# Patient Record
Sex: Male | Born: 1970 | Race: White | Hispanic: No | Smoking: Current every day smoker
Health system: Southern US, Community
[De-identification: ages and names within clinical notes are randomized; demographics above are authoritative.]

## PROBLEM LIST (undated history)

## (undated) DIAGNOSIS — R918 Other nonspecific abnormal finding of lung field: Secondary | ICD-10-CM

## (undated) DIAGNOSIS — M1990 Unspecified osteoarthritis, unspecified site: Secondary | ICD-10-CM

## (undated) DIAGNOSIS — I10 Essential (primary) hypertension: Secondary | ICD-10-CM

## (undated) DIAGNOSIS — K219 Gastro-esophageal reflux disease without esophagitis: Secondary | ICD-10-CM

## (undated) DIAGNOSIS — Z933 Colostomy status: Secondary | ICD-10-CM

## (undated) DIAGNOSIS — C3490 Malignant neoplasm of unspecified part of unspecified bronchus or lung: Secondary | ICD-10-CM

## (undated) DIAGNOSIS — R112 Nausea with vomiting, unspecified: Secondary | ICD-10-CM

## (undated) DIAGNOSIS — K5780 Diverticulitis of intestine, part unspecified, with perforation and abscess without bleeding: Secondary | ICD-10-CM

## (undated) DIAGNOSIS — Z9889 Other specified postprocedural states: Secondary | ICD-10-CM

## (undated) DIAGNOSIS — J439 Emphysema, unspecified: Secondary | ICD-10-CM

## (undated) DIAGNOSIS — F32A Depression, unspecified: Secondary | ICD-10-CM

## (undated) DIAGNOSIS — R519 Headache, unspecified: Secondary | ICD-10-CM

## (undated) HISTORY — DX: Malignant neoplasm of unspecified part of unspecified bronchus or lung: C34.90

## (undated) HISTORY — DX: Essential (primary) hypertension: I10

## (undated) HISTORY — DX: Gastro-esophageal reflux disease without esophagitis: K21.9

## (undated) HISTORY — DX: Emphysema, unspecified: J43.9

## (undated) HISTORY — PX: KNEE ARTHROSCOPY: SHX127

## (undated) HISTORY — DX: Other nonspecific abnormal finding of lung field: R91.8

## (undated) HISTORY — PX: HAND TENDON SURGERY: SHX663

## (undated) HISTORY — DX: Colostomy status: Z93.3

## (undated) HISTORY — DX: Diverticulitis of intestine, part unspecified, with perforation and abscess without bleeding: K57.80

---

## 2000-05-16 ENCOUNTER — Ambulatory Visit (HOSPITAL_COMMUNITY): Admission: RE | Admit: 2000-05-16 | Discharge: 2000-05-16 | Payer: Self-pay | Admitting: Specialist

## 2000-05-16 ENCOUNTER — Encounter: Payer: Self-pay | Admitting: Specialist

## 2003-12-07 ENCOUNTER — Emergency Department (HOSPITAL_COMMUNITY): Admission: EM | Admit: 2003-12-07 | Discharge: 2003-12-07 | Payer: Self-pay | Admitting: Emergency Medicine

## 2014-02-10 ENCOUNTER — Emergency Department (HOSPITAL_COMMUNITY)
Admission: EM | Admit: 2014-02-10 | Discharge: 2014-02-11 | Discharge status: Against Medical Advice | Payer: Self-pay | Attending: Emergency Medicine | Admitting: Emergency Medicine

## 2014-02-10 ENCOUNTER — Encounter (HOSPITAL_COMMUNITY): Payer: Self-pay | Admitting: Emergency Medicine

## 2014-02-10 DIAGNOSIS — T148XXA Other injury of unspecified body region, initial encounter: Secondary | ICD-10-CM

## 2014-02-10 DIAGNOSIS — Z720 Tobacco use: Secondary | ICD-10-CM | POA: Insufficient documentation

## 2014-02-10 DIAGNOSIS — R238 Other skin changes: Secondary | ICD-10-CM | POA: Insufficient documentation

## 2014-02-10 NOTE — ED Notes (Signed)
Pt c/o left index finger pain since last night. Pt state it started itching and turned into blood blister per pt and he opened it up.

## 2014-02-11 NOTE — ED Provider Notes (Signed)
CSN: [id number]     Arrival date & time 02/10/14  2236 History   First MD Initiated Contact with Patient 02/10/14 2356     Chief Complaint  Patient presents with  . Insect Bite     (Consider location/radiation/quality/duration/timing/severity/associated sxs/prior Treatment) The history is provided by the patient.   Dakota Bryant is a 43 y.o. male presenting with a lesion on his dorsal left index finger.  He describes developing itching at the site last night while in bed which he scratched and over time it developed a sloughing to the skin surface.  When he looked at it, he thought he saw a dark line which he suspected might be a sliver, but was unable to find when he looked at it under better lighting.  Today the site has progressed to a raised "blood blister" which he lanced using a alcohol swabbed razor blade.  It drained clear fluid, but since has drained blood.  He has had increased swelling, redness and pain at the site and is concerned for possible dangerous insect bite, but denies feeling or seeing an insect when the symptoms began.  He has taken no medicines for this complaint prior to arrival. He is utd with his tetanus.     History reviewed. No pertinent past medical history. History reviewed. No pertinent past surgical history. History reviewed. No pertinent family history. History  Substance Use Topics  . Smoking status: Current Every Day Smoker    Types: Cigarettes  . Smokeless tobacco: Not on file  . Alcohol Use: Yes    Review of Systems  Constitutional: Negative for fever and chills.  Respiratory: Negative for shortness of breath and wheezing.   Skin: Positive for wound.  Neurological: Negative for numbness.      Allergies  Review of patient's allergies indicates not on file.  Home Medications   Prior to Admission medications   Not on File   BP 138/94 mmHg  Pulse 87  Temp(Src) 98.5 F (36.9 C) (Oral)  Resp 20  Ht 5\' 6"  (1.676 m)  Wt 130 lb  (58.968 kg)  BMI 20.99 kg/m2  SpO2 99% Physical Exam  Constitutional: He appears well-developed and well-nourished. No distress.  HENT:  Head: Normocephalic.  Neck: Neck supple.  Cardiovascular: Normal rate.   Pulmonary/Chest: Effort normal. He has no wheezes.  Musculoskeletal: Normal range of motion. He exhibits no edema.  Skin: There is erythema.  Blood filled blister left dorsal index finger middle phalanx.  Mild surrounding erythema and edema of mid finger.  No red streaking.  Cannot visualize fb such as splinter.    ED Course  Procedures (including critical care time)  Plan to xray finger to r/o fb.  Labs Review Labs Reviewed - No data to display  Imaging Review No results found.   EKG Interpretation None      MDM   Final diagnoses:  Blood blister    Pt left dept without notifying staff.  He did not obtain the finger xray.    Evalee Jefferson, PA-C 02/11/14 0139  Wynetta Fines, MD 02/11/14 [phone]

## 2014-02-11 NOTE — ED Notes (Signed)
Patient walked out of department.

## 2014-02-11 NOTE — ED Notes (Signed)
Patient left department without notifying staff.

## 2014-09-01 ENCOUNTER — Other Ambulatory Visit: Payer: Self-pay | Admitting: *Deleted

## 2014-09-01 DIAGNOSIS — A630 Anogenital (venereal) warts: Secondary | ICD-10-CM

## 2015-06-26 ENCOUNTER — Ambulatory Visit (INDEPENDENT_AMBULATORY_CARE_PROVIDER_SITE_OTHER): Payer: Self-pay | Admitting: Family Medicine

## 2015-06-26 VITALS — BP 128/81 | HR 92 | Temp 97.2°F | Ht 66.0 in | Wt 149.0 lb

## 2015-06-26 DIAGNOSIS — S91332A Puncture wound without foreign body, left foot, initial encounter: Secondary | ICD-10-CM

## 2015-06-26 DIAGNOSIS — Z23 Encounter for immunization: Secondary | ICD-10-CM

## 2015-06-26 MED ORDER — SULFAMETHOXAZOLE-TRIMETHOPRIM 800-160 MG PO TABS: 1.0000 | ORAL_TABLET | Freq: Two times a day (BID) | ORAL | Status: DC

## 2015-06-26 NOTE — Patient Instructions (Signed)
Great to meet you! Take all of the antibiotics  If you have worsening pain, redness, warmth, or other concerns please call or come back

## 2015-06-26 NOTE — Progress Notes (Signed)
   HPI  Patient presents today after stepping on nail about 18 hours ago.  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  It appears to issue an injured his foot approximately 1/2 inch. He removed it and kept Working.  He denies fever, chills, sweats.  After he removed the nail he kept working, then he went bowling.  He has pain of the foot and has difficulty walking on it today due to the pain  He has pain and swelling perceived on the dorsal part of his foot.  PMH: Smoking status noted ROS: Per HPI  Objective: BP 128/81 mmHg  Pulse 92  Temp(Src) 97.2 F (36.2 C) (Oral)  Ht '5\' 6"'$  (1.676 m)  Wt 149 lb (67.586 kg)  BMI 24.06 kg/m2 Gen: NAD, alert, cooperative with exam HEENT: NCAT CV: RRR, good S1/S2, no murmur Resp: CTABL, no wheezes, non-labored Neuro: Alert and oriented, No gross deficits  Skin:  Smal closed puncture approx 3-5 mm in length on L plantar surface Warmth extending to dorsal foot, tendernes  To palp of the area but no pus expression or obvious induration  I&D Area was cleaned with Betadine 1 month clear with alcohol Using a 27-gauge needle approximately 1 mL of 2% Xylocaine with epinephrine was used to for analgesia After the area was numb I used an 11 blade to make an 8 mm incision that was 2-4 mm deep over the puncture site. The area was palpated vigorously with no expression of pus, only bloody discharge  Assessment and plan:  # Puncture wound I&D considering his warmth and induration that extended all the way to the dorsal foot, very shallow wound made and no pus expressed. Bactrim DS 7 days Discussed usual healing, wound care, and reasons to return for care. Tetanus given   Meds ordered this encounter  Medications  . esomeprazole (NEXIUM) 20 MG capsule    Sig: Take 20 mg by mouth daily at 12 noon.  .  sulfamethoxazole-trimethoprim (BACTRIM DS) 800-160 MG tablet    Sig: Take 1 tablet by mouth 2 (two) times daily.    Dispense:  14 tablet    Refill:  0    Laroy Apple, MD [facility] 06/26/2015, 8:40 AM

## 2015-06-29 ENCOUNTER — Encounter (INDEPENDENT_AMBULATORY_CARE_PROVIDER_SITE_OTHER): Payer: Self-pay

## 2015-06-29 ENCOUNTER — Ambulatory Visit (INDEPENDENT_AMBULATORY_CARE_PROVIDER_SITE_OTHER): Payer: Self-pay

## 2015-06-29 ENCOUNTER — Encounter: Payer: Self-pay | Admitting: Family Medicine

## 2015-06-29 ENCOUNTER — Ambulatory Visit: Payer: Self-pay | Admitting: Family Medicine

## 2015-06-29 VITALS — BP 136/86 | HR 87 | Temp 97.5°F | Ht 66.0 in | Wt 148.0 lb

## 2015-06-29 DIAGNOSIS — M79672 Pain in left foot: Secondary | ICD-10-CM

## 2015-06-29 DIAGNOSIS — S91332D Puncture wound without foreign body, left foot, subsequent encounter: Secondary | ICD-10-CM

## 2015-06-29 NOTE — Progress Notes (Signed)
   Subjective:    Patient ID: Edman Circle, male    DOB: May 02, 1970, 45 y.o.   MRN: [id number]  HPI Patient here today for follow up on left foot wound. He stepped on a nail last Friday. I spoke to the patient last night he said that the pain in his foot had worsened and that he is using crutches and had trouble bearing weight. I felt like he should come in for possible Rocephin injection but once he arrived realize that he is allergic to penicillin we cannot give him this. He has a history of having had old fracture in his foot with no treatment for this. And because of the increased pain felt like an x-ray was necessary.     There are no active problems to display for this patient.  Outpatient Encounter Prescriptions as of 06/29/2015  Medication Sig  . esomeprazole (NEXIUM) 20 MG capsule Take 20 mg by mouth daily at 12 noon.  . sulfamethoxazole-trimethoprim (BACTRIM DS) 800-160 MG tablet Take 1 tablet by mouth 2 (two) times daily.   No facility-administered encounter medications on file as of 06/29/2015.      Review of Systems  Constitutional: Negative.   HENT: Negative.   Eyes: Negative.   Respiratory: Negative.   Cardiovascular: Negative.   Gastrointestinal: Negative.   Endocrine: Negative.   Genitourinary: Negative.   Musculoskeletal: Negative.   Skin: Wound: left foot - nail.  Allergic/Immunologic: Negative.   Neurological: Negative.   Hematological: Negative.   Psychiatric/Behavioral: Negative.        Objective:   Physical Exam  Constitutional: He is oriented to person, place, and time. He appears well-developed and well-nourished.  Musculoskeletal: He exhibits edema and tenderness.  The patient is using crutches. His foot is swollen. It is tender to palpation on the plantar surface. There is some warmth and redness. There is no drainage from the puncture 1.  Neurological: He is alert and oriented to person, place, and time.  Skin: Skin is warm. No rash noted.  There is erythema. No pallor.  Psychiatric: He has a normal mood and affect. His behavior is normal. Judgment and thought content normal.  Nursing note and vitals reviewed.  BP 136/86 mmHg  Pulse 87  Temp(Src) 97.5 F (36.4 C) (Oral)  Ht '5\' 6"'$  (1.676 m)  Wt 148 lb (67.132 kg)  BMI 23.90 kg/m2   [facility] reading (PRIMARY) by  Dr. Louretta Parma foot x-ray secondary to puncture wound by nail  --reading is pending                                     Assessment & Plan:  1. Left foot pain - DG Foot Complete Left; Future  2. Puncture wound of foot, left, subsequent encounter - DG Foot Complete Left; Future -Continue antibiotic -Elevate foot as much as possible -Take Tylenol if needed for pain  Patient Instructions  Keep foot elevated as much as possible Continue to take antibiotic and stay on this until foot is improved   Arrie Senate MD

## 2015-06-29 NOTE — Addendum Note (Signed)
Addended by: Marylin Crosby on: 06/29/2015 04:19 PM   Modules accepted: Orders

## 2015-06-29 NOTE — Patient Instructions (Signed)
Keep foot elevated as much as possible Continue to take antibiotic and stay on this until foot is improved

## 2015-06-30 ENCOUNTER — Telehealth: Payer: Self-pay

## 2015-06-30 NOTE — Telephone Encounter (Signed)
Pt aware of foot x-ray results

## 2015-06-30 NOTE — Telephone Encounter (Signed)
[facility] to x-ray  Foot x-ray negative; no evidence of a foreign body (nail)

## 2015-07-05 ENCOUNTER — Other Ambulatory Visit: Payer: Self-pay

## 2015-07-05 NOTE — Telephone Encounter (Signed)
Last seen 06/29/15  [facility]

## 2015-07-06 MED ORDER — SULFAMETHOXAZOLE-TRIMETHOPRIM 800-160 MG PO TABS: 1.0000 | ORAL_TABLET | Freq: Two times a day (BID) | ORAL | Status: DC

## 2016-01-06 ENCOUNTER — Ambulatory Visit (INDEPENDENT_AMBULATORY_CARE_PROVIDER_SITE_OTHER): Payer: Self-pay | Admitting: Family Medicine

## 2016-01-06 ENCOUNTER — Encounter: Payer: Self-pay | Admitting: Family Medicine

## 2016-01-06 VITALS — BP 134/94 | HR 72 | Temp 97.9°F | Ht 66.0 in | Wt 148.0 lb

## 2016-01-06 DIAGNOSIS — Z91030 Bee allergy status: Secondary | ICD-10-CM

## 2016-01-06 DIAGNOSIS — Z91038 Other insect allergy status: Secondary | ICD-10-CM

## 2016-01-06 MED ORDER — PREDNISONE 10 MG PO TABS: ORAL_TABLET | ORAL | 0 refills | Status: DC

## 2016-01-06 MED ORDER — METHYLPREDNISOLONE ACETATE 80 MG/ML IJ SUSP
60.0000 mg | Freq: Once | INTRAMUSCULAR | Status: AC
  Administered 2016-01-06: 60 mg via INTRAMUSCULAR

## 2016-01-06 MED ORDER — EPINEPHRINE 0.3 MG/0.3ML IJ SOAJ: 0.3000 mg | Freq: Once | INTRAMUSCULAR | 1 refills | Status: AC

## 2016-01-06 NOTE — Patient Instructions (Addendum)
Take Benadryl or diphenhydramine 25 mg 1 or 2 every 4-6 hours as needed for allergic reaction to bee sting. Take Tylenol as needed for pain Take prednisone until completed You were given a prescription for an EpiPen which should be used for any future bee stings especially if you have a severe reaction like shortness of breath or swelling that is not controlled with the Benadryl. Today drink only cold fluids
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
## 2016-01-20 ENCOUNTER — Other Ambulatory Visit: Payer: Self-pay | Admitting: *Deleted

## 2016-01-20 MED ORDER — CEPHALEXIN 500 MG PO CAPS: 500.0000 mg | ORAL_CAPSULE | Freq: Three times a day (TID) | ORAL | 0 refills | Status: DC

## 2016-01-20 NOTE — Telephone Encounter (Signed)
Per [facility] - please call in Keflex 500 TID x 10 days for pt - he feels there is a lump in his throat

## 2017-06-11 ENCOUNTER — Telehealth: Payer: Self-pay | Admitting: Family Medicine

## 2017-06-11 MED ORDER — MECLIZINE HCL 12.5 MG PO TABS: 12.5000 mg | ORAL_TABLET | Freq: Three times a day (TID) | ORAL | 0 refills | Status: DC | PRN

## 2017-06-11 MED ORDER — SALINE SPRAY 0.65 % NA SOLN: 1.0000 | NASAL | 1 refills | Status: DC | PRN

## 2017-06-11 NOTE — Telephone Encounter (Signed)
Pt contacted [facility] and needed something for nasal drainage that was blood tinged and dizziness.  meds were ordered and approved

## 2018-05-27 ENCOUNTER — Encounter (HOSPITAL_COMMUNITY): Payer: Self-pay | Admitting: Emergency Medicine

## 2018-05-27 ENCOUNTER — Emergency Department (HOSPITAL_COMMUNITY): Payer: No Typology Code available for payment source

## 2018-05-27 ENCOUNTER — Other Ambulatory Visit: Payer: Self-pay

## 2018-05-27 ENCOUNTER — Inpatient Hospital Stay (HOSPITAL_COMMUNITY)
Admission: EM | Admit: 2018-05-27 | Discharge: 2018-05-27 | DRG: 815 | Discharge status: Against Medical Advice | Payer: No Typology Code available for payment source | Attending: General Surgery | Admitting: General Surgery

## 2018-05-27 DIAGNOSIS — S36031A Moderate laceration of spleen, initial encounter: Principal | ICD-10-CM | POA: Diagnosis present

## 2018-05-27 DIAGNOSIS — F17210 Nicotine dependence, cigarettes, uncomplicated: Secondary | ICD-10-CM | POA: Diagnosis present

## 2018-05-27 DIAGNOSIS — Y92410 Unspecified street and highway as the place of occurrence of the external cause: Secondary | ICD-10-CM

## 2018-05-27 DIAGNOSIS — Z79899 Other long term (current) drug therapy: Secondary | ICD-10-CM | POA: Diagnosis not present

## 2018-05-27 DIAGNOSIS — S82042A Displaced comminuted fracture of left patella, initial encounter for closed fracture: Secondary | ICD-10-CM | POA: Diagnosis present

## 2018-05-27 DIAGNOSIS — M25562 Pain in left knee: Secondary | ICD-10-CM | POA: Diagnosis not present

## 2018-05-27 DIAGNOSIS — Z880 Allergy status to penicillin: Secondary | ICD-10-CM | POA: Diagnosis not present

## 2018-05-27 LAB — CBC WITH DIFFERENTIAL/PLATELET
Abs Immature Granulocytes: 0.04 10*3/uL (ref 0.00–0.07)
Basophils Absolute: 0.1 10*3/uL (ref 0.0–0.1)
Basophils Relative: 1 %
Eosinophils Absolute: 0.1 10*3/uL (ref 0.0–0.5)
Eosinophils Relative: 0 %
HCT: 51.9 % (ref 39.0–52.0)
Hemoglobin: 17.3 g/dL — ABNORMAL HIGH (ref 13.0–17.0)
Immature Granulocytes: 0 %
Lymphocytes Relative: 22 %
Lymphs Abs: 3.1 10*3/uL (ref 0.7–4.0)
MCH: 32.3 pg (ref 26.0–34.0)
MCHC: 33.3 g/dL (ref 30.0–36.0)
MCV: 96.8 fL (ref 80.0–100.0)
Monocytes Absolute: 1.1 10*3/uL — ABNORMAL HIGH (ref 0.1–1.0)
Monocytes Relative: 8 %
Neutro Abs: 9.6 10*3/uL — ABNORMAL HIGH (ref 1.7–7.7)
Neutrophils Relative %: 69 %
Platelets: 274 10*3/uL (ref 150–400)
RBC: 5.36 MIL/uL (ref 4.22–5.81)
RDW: 12.4 % (ref 11.5–15.5)
WBC: 14 10*3/uL — ABNORMAL HIGH (ref 4.0–10.5)
nRBC: 0 % (ref 0.0–0.2)

## 2018-05-27 LAB — COMPREHENSIVE METABOLIC PANEL
ALT: 18 U/L (ref 0–44)
AST: 22 U/L (ref 15–41)
Albumin: 4.3 g/dL (ref 3.5–5.0)
Alkaline Phosphatase: 48 U/L (ref 38–126)
Anion gap: 8 (ref 5–15)
BUN: <5 mg/dL — ABNORMAL LOW (ref 6–20)
CHLORIDE: 104 mmol/L (ref 98–111)
CO2: 25 mmol/L (ref 22–32)
Calcium: 8.9 mg/dL (ref 8.9–10.3)
Creatinine, Ser: 0.63 mg/dL (ref 0.61–1.24)
GFR calc Af Amer: >60 mL/min (ref >60)
GFR calc non Af Amer: >60 mL/min (ref >60)
Glucose, Bld: 110 mg/dL — ABNORMAL HIGH (ref 70–99)
Potassium: 3.5 mmol/L (ref 3.5–5.1)
Sodium: 137 mmol/L (ref 135–145)
Total Bilirubin: 0.9 mg/dL (ref 0.3–1.2)
Total Protein: 7.1 g/dL (ref 6.5–8.1)

## 2018-05-27 MED ORDER — IOHEXOL 300 MG/ML  SOLN
100.0000 mL | Freq: Once | INTRAMUSCULAR | Status: AC | PRN
  Administered 2018-05-27: 100 mL via INTRAVENOUS

## 2018-05-27 MED ORDER — SODIUM CHLORIDE 0.9 % IV BOLUS
1000.0000 mL | Freq: Once | INTRAVENOUS | Status: AC
  Administered 2018-05-27: 1000 mL via INTRAVENOUS

## 2018-05-27 MED ORDER — ONDANSETRON HCL 4 MG/2ML IJ SOLN
4.0000 mg | Freq: Once | INTRAMUSCULAR | Status: AC
  Administered 2018-05-27: 4 mg via INTRAVENOUS
  Filled 2018-05-27: qty 2

## 2018-05-27 MED ORDER — HYDROMORPHONE HCL 1 MG/ML IJ SOLN
1.0000 mg | Freq: Once | INTRAMUSCULAR | Status: AC
  Administered 2018-05-27: 1 mg via INTRAVENOUS
  Filled 2018-05-27: qty 1

## 2018-05-27 NOTE — ED Notes (Signed)
Pt refusing admission.  Dr Roderic Palau in to speak with pt and family member.

## 2018-05-27 NOTE — Discharge Instructions (Addendum)
Follow-up with Dr. Percell Miller for your knee.  If any problems return to the hospital to be examined
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
## 2018-05-28 ENCOUNTER — Telehealth: Payer: Self-pay | Admitting: *Deleted

## 2018-05-28 DIAGNOSIS — S36039S Unspecified laceration of spleen, sequela: Secondary | ICD-10-CM

## 2018-05-28 DIAGNOSIS — S82002S Unspecified fracture of left patella, sequela: Secondary | ICD-10-CM
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
## 2018-05-28 NOTE — Telephone Encounter (Signed)
Please call patient and follow through for referral to general surgeon and orthopedic surgeon on an urgent basis hopefully to be seen today.

## 2018-05-28 NOTE — Telephone Encounter (Signed)
Pt aware of referrals   Call cell # [phone]

## 2018-05-29 DIAGNOSIS — M25562 Pain in left knee: Secondary | ICD-10-CM | POA: Insufficient documentation

## 2018-05-29 DIAGNOSIS — S82002A Unspecified fracture of left patella, initial encounter for closed fracture: Secondary | ICD-10-CM | POA: Insufficient documentation

## 2018-05-30 DIAGNOSIS — M25522 Pain in left elbow: Secondary | ICD-10-CM | POA: Insufficient documentation

## 2018-07-18 ENCOUNTER — Telehealth: Payer: Self-pay

## 2018-07-18 NOTE — Telephone Encounter (Signed)
FYI, patient cancelled his appointment for [facility].

## 2018-07-19 ENCOUNTER — Encounter: Payer: Self-pay | Admitting: Physical Therapy

## 2018-07-19 ENCOUNTER — Other Ambulatory Visit: Payer: Self-pay

## 2018-07-19 ENCOUNTER — Ambulatory Visit: Payer: Self-pay | Attending: Orthopedic Surgery | Admitting: Physical Therapy

## 2018-07-19 DIAGNOSIS — M25662 Stiffness of left knee, not elsewhere classified: Secondary | ICD-10-CM | POA: Insufficient documentation

## 2018-07-19 DIAGNOSIS — M6281 Muscle weakness (generalized): Secondary | ICD-10-CM | POA: Insufficient documentation

## 2018-07-19 DIAGNOSIS — M25562 Pain in left knee: Secondary | ICD-10-CM | POA: Insufficient documentation
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
## 2018-07-23 ENCOUNTER — Encounter: Payer: Self-pay | Admitting: Physical Therapy

## 2018-07-23 ENCOUNTER — Ambulatory Visit: Payer: Self-pay | Admitting: Physical Therapy

## 2018-07-23 ENCOUNTER — Other Ambulatory Visit: Payer: Self-pay

## 2018-07-23 DIAGNOSIS — M25562 Pain in left knee: Secondary | ICD-10-CM

## 2018-07-23 DIAGNOSIS — M6281 Muscle weakness (generalized): Secondary | ICD-10-CM

## 2018-07-23 DIAGNOSIS — M25662 Stiffness of left knee, not elsewhere classified: Secondary | ICD-10-CM
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
## 2018-08-01 ENCOUNTER — Ambulatory Visit: Payer: Self-pay | Admitting: Physical Therapy

## 2018-08-01 ENCOUNTER — Encounter: Payer: Self-pay | Admitting: Physical Therapy

## 2018-08-01 ENCOUNTER — Other Ambulatory Visit: Payer: Self-pay

## 2018-08-01 DIAGNOSIS — M25562 Pain in left knee: Secondary | ICD-10-CM

## 2018-08-01 DIAGNOSIS — M6281 Muscle weakness (generalized): Secondary | ICD-10-CM

## 2018-08-01 DIAGNOSIS — M25662 Stiffness of left knee, not elsewhere classified: Secondary | ICD-10-CM
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
## 2018-08-06 ENCOUNTER — Ambulatory Visit: Payer: Self-pay | Admitting: Physical Therapy

## 2018-08-06 ENCOUNTER — Other Ambulatory Visit: Payer: Self-pay

## 2018-08-06 DIAGNOSIS — M25562 Pain in left knee: Secondary | ICD-10-CM

## 2018-08-06 DIAGNOSIS — M6281 Muscle weakness (generalized): Secondary | ICD-10-CM

## 2018-08-06 DIAGNOSIS — M25662 Stiffness of left knee, not elsewhere classified: Secondary | ICD-10-CM
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
## 2018-08-13 ENCOUNTER — Ambulatory Visit: Payer: Self-pay | Admitting: Physical Therapy

## 2018-08-13 ENCOUNTER — Other Ambulatory Visit: Payer: Self-pay

## 2018-08-13 DIAGNOSIS — M25562 Pain in left knee: Secondary | ICD-10-CM

## 2018-08-13 DIAGNOSIS — M25662 Stiffness of left knee, not elsewhere classified: Secondary | ICD-10-CM

## 2018-08-13 DIAGNOSIS — M6281 Muscle weakness (generalized): Secondary | ICD-10-CM
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
## 2018-08-19 ENCOUNTER — Encounter: Payer: Self-pay | Admitting: Physical Therapy

## 2018-08-19 ENCOUNTER — Other Ambulatory Visit: Payer: Self-pay

## 2018-08-19 ENCOUNTER — Ambulatory Visit: Payer: Self-pay | Attending: Orthopedic Surgery | Admitting: Physical Therapy

## 2018-08-19 DIAGNOSIS — M25562 Pain in left knee: Secondary | ICD-10-CM

## 2018-08-19 DIAGNOSIS — M6281 Muscle weakness (generalized): Secondary | ICD-10-CM

## 2018-08-19 DIAGNOSIS — M25662 Stiffness of left knee, not elsewhere classified: Secondary | ICD-10-CM
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
## 2018-08-22 ENCOUNTER — Other Ambulatory Visit: Payer: Self-pay

## 2018-08-22 ENCOUNTER — Ambulatory Visit: Payer: Self-pay | Admitting: Physical Therapy

## 2018-08-22 ENCOUNTER — Encounter: Payer: Self-pay | Admitting: Physical Therapy

## 2018-08-22 DIAGNOSIS — M25562 Pain in left knee: Secondary | ICD-10-CM

## 2018-08-22 DIAGNOSIS — M6281 Muscle weakness (generalized): Secondary | ICD-10-CM

## 2018-08-22 DIAGNOSIS — M25662 Stiffness of left knee, not elsewhere classified: Secondary | ICD-10-CM
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
## 2019-03-21 DIAGNOSIS — U071 COVID-19: Secondary | ICD-10-CM

## 2019-03-21 HISTORY — DX: COVID-19: U07.1

## 2020-11-29 ENCOUNTER — Other Ambulatory Visit: Payer: Self-pay

## 2020-12-01 ENCOUNTER — Ambulatory Visit (INDEPENDENT_AMBULATORY_CARE_PROVIDER_SITE_OTHER): Payer: Self-pay | Admitting: Vascular Surgery

## 2020-12-01 ENCOUNTER — Other Ambulatory Visit: Payer: Self-pay

## 2020-12-01 ENCOUNTER — Ambulatory Visit (INDEPENDENT_AMBULATORY_CARE_PROVIDER_SITE_OTHER): Payer: Self-pay

## 2020-12-01 ENCOUNTER — Encounter: Payer: Self-pay | Admitting: Vascular Surgery

## 2020-12-01 VITALS — BP 171/105 | HR 70 | Temp 97.2°F | Ht 66.0 in | Wt 129.4 lb

## 2020-12-01 DIAGNOSIS — I7389 Other specified peripheral vascular diseases: Secondary | ICD-10-CM

## 2020-12-01 NOTE — Progress Notes (Signed)
[facility] [place]  Patient name: Dakota Bryant MRN: [id number] DOB: Jul 24, 1970 Sex: male  REASON FOR CONSULT: Evaluation ischemia right hand  HPI: Dakota Bryant is a 50 y.o. male, who is here today of a for evaluation of ischemia right hand.  He is a very active otherwise healthy gentleman who approximately 6 weeks ago noticed onset of pain in his right hand.  This has been mostly at night.  He reports that this awakens him at night with severe pain.  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  He is right-handed.  He has no prior history of ischemia.  He is a cigarette smoker.  He has had some progression of ischemic changes on the fourth and fifth fingertips.  He was seen in an outlying emergency room and was potentially diagnosed with Raynaud's disease.  On questioning his use of his hand and his work as a Games developer, he does admit to very frequently using the hyperthenar eminence of his right palm as a hammer.  He does have a callus over this area from frequent use.  History reviewed. No pertinent past medical history.  History reviewed. No pertinent family history.  SOCIAL HISTORY: Social History   Socioeconomic History   Marital status: Divorced    Spouse name: Not on file   Number of children: Not on file   Years of education: Not on file   Highest education level: Not on file  Occupational History   Not on file  Tobacco Use   Smoking status: Every Day    Packs/day: 1.00    Types: Cigarettes   Smokeless tobacco: Never  Substance and Sexual Activity   Alcohol use: Yes    Alcohol/week: 2.0 standard drinks    Types: 2 Cans of beer per week    Comment: nighlty    Drug use: Yes    Types: Marijuana    Comment: occ   Sexual activity: Not on file  Other Topics Concern   Not on file  Social History Narrative   Not on file   Social Determinants of Health   Financial Resource Strain: Not on file   Food Insecurity: Not on file  Transportation Needs: Not on file  Physical Activity: Not on file  Stress: Not on file  Social Connections: Not on file  Intimate Partner Violence: Not on file    Allergies  Allergen Reactions   Penicillins Other (See Comments)    Did it involve swelling of the face/tongue/throat, SOB, or low BP? Unknown Did it involve sudden or severe rash/hives, skin peeling, or any reaction on the inside of your mouth or nose? Unknown Did you need to seek medical attention at a hospital or doctor's office? Unknown When did it last happen?       If all above answers are "NO", may proceed with cephalosporin use.     Current Outpatient Medications  Medication Sig Dispense Refill   esomeprazole (NEXIUM) 20 MG capsule Take 20 mg by mouth daily at 12 noon.     ibuprofen (ADVIL,MOTRIN) 200 MG tablet Take 200-400 mg by mouth every 6 (six) hours as needed for mild pain or moderate pain.     No current facility-administered medications for this visit.    REVIEW OF SYSTEMS:  [X]  denotes positive finding, [ ]  denotes negative finding Cardiac  Comments:  Chest pain or chest pressure: x   Shortness of breath upon exertion: x   Short of  breath when lying flat:    Irregular heart rhythm:        Vascular    Pain in calf, thigh, or hip brought on by ambulation:    Pain in feet at night that wakes you up from your sleep:     Blood clot in your veins:    Leg swelling:         Pulmonary    Oxygen at home:    Productive cough:     Wheezing:         Neurologic    Sudden weakness in arms or legs:  x   Sudden numbness in arms or legs:  x   Sudden onset of difficulty speaking or slurred speech:    Temporary loss of vision in one eye:     Problems with dizziness:         Gastrointestinal    Blood in stool:  x   Vomited blood:         Genitourinary    Burning when urinating:     Blood in urine:        Psychiatric    Major depression:         Hematologic     Bleeding problems:    Problems with blood clotting too easily:        Skin    Rashes or ulcers:        Constitutional    Fever or chills:      PHYSICAL EXAM: Vitals:   12/01/20 0851 12/01/20 0854  BP: (!) 163/104 (!) 171/105  Pulse: 71 70  Temp: (!) 97.2 F (36.2 C)   TempSrc: Temporal   SpO2: 98% 97%  Weight: 129 lb 6.4 oz (58.7 kg)   Height: 5\' 6"  (1.676 m)     GENERAL: The patient is a well-nourished male, in no acute distress. The vital signs are documented above. CARDIOVASCULAR: 2+ radial pulse bilaterally.  I do not feel ulnar pulses bilaterally. PULMONARY: There is good air exchange  MUSCULOSKELETAL: There are no major deformities or cyanosis. NEUROLOGIC: No focal weakness or paresthesias are detected. SKIN: There are no ulcers or rashes noted.  Patient does have ischemic changes with cyanosis on the distal portion of his right fourth and fifth finger.  No tissue loss. PSYCHIATRIC: The patient has a normal affect.       DATA:  Noninvasive studies of right arm reveal blunted waveform in the ulnar artery with normal waveform in the radial.  MEDICAL ISSUES: Patient has classic hypothenar hammer syndrome.  I had a long discussion with the patient regarding this.  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  I explained that this should resolve slowly over time and that there is no surgical treatment.  He had began aspirin therapy on his own 3 to 4 days ago and I have encouraged him to continue this.  He does have a history of GI bleed and will take 81 mg enteric aspirin.  I did explain that there is a chance that he will have tissue loss and if so would require referral to hand surgeon for debridement and possible amputation of the tips of his fingers.  I did explain that this would be unlikely.  He is having a very Difficult time with sleep and with pain at night.  I have asked him to  follow-up with his primary care providers at [facility]  for pain management.  I will see him again in 6 weeks for follow-up.  He will notify us sooner should he develop any tissue loss   Rosetta Posner, MD FACS [facility] Tel [phone] Pager [phone]  Note: Portions of this report may have been transcribed using voice recognition software.  Every effort has been made to ensure accuracy; however, inadvertent computerized transcription errors may still be present.

## 2020-12-03 ENCOUNTER — Ambulatory Visit (INDEPENDENT_AMBULATORY_CARE_PROVIDER_SITE_OTHER): Payer: Self-pay | Admitting: Nurse Practitioner

## 2020-12-03 ENCOUNTER — Encounter: Payer: Self-pay | Admitting: Nurse Practitioner

## 2020-12-03 ENCOUNTER — Other Ambulatory Visit: Payer: Self-pay

## 2020-12-03 VITALS — BP 124/85 | HR 74 | Temp 98.1°F | Resp 20 | Ht 66.0 in | Wt 130.0 lb

## 2020-12-03 DIAGNOSIS — I7389 Other specified peripheral vascular diseases: Secondary | ICD-10-CM

## 2020-12-03 MED ORDER — TRAMADOL HCL 50 MG PO TABS: 50.0000 mg | ORAL_TABLET | Freq: Every evening | ORAL | 0 refills | Status: AC | PRN

## 2020-12-03 NOTE — Progress Notes (Signed)
   Subjective:    Patient ID: Dakota Bryant, male    DOB: Jul 06, 1970, 50 y.o.   MRN: [id number]   Chief Complaint: hand and arm pain  HPI Patient was out of town last week and developed hand pain and hand was turning blue. He went to ED and ws dx with raynaud. When he got home his hand continued to hurt and was not getting better. He went to see a vascular surgeon in [place] and was dx with hypothenar hammer syndrome. This is a rare syndrome in which the blood flow to the fingers is reduced. He says during the day the pain is tolerable, but at night pain is pounding. Rates pain 10/10 intermittently at night. Stays numb and tinging the rest of the time.   Vascular surgeon contacted him yesterday and said that he wants him to see a hand vein specialist to make sure diagnosis is correct. They have done referral and he is waiting to hear from them Does smoke marijuana and a friend gave him a pain med 2 days ago- not sure what it was.  Review of Systems     Objective:   Physical Exam Vitals and nursing note reviewed.  Constitutional:      Appearance: Normal appearance.  Cardiovascular:     Rate and Rhythm: Normal rate and regular rhythm.     Pulses: Normal pulses.     Comments: pounding radial pulse on right Pulmonary:     Effort: Pulmonary effort is normal.     Breath sounds: Normal breath sounds.  Skin:    General: Skin is warm and dry.     Comments: Tips pf right 4th and 5th finger are cyanotic and cool to touch.   Neurological:     General: No focal deficit present.     Mental Status: He is alert and oriented to person, place, and time.     BP 124/85   Pulse 74   Temp 98.1 F (36.7 C) (Temporal)   Resp 20   Ht 5\' 6"  (1.676 m)   Wt 130 lb (59 kg)   SpO2 97%   BMI 20.98 kg/m       Assessment & Plan:  Dakota Bryant in today with chief complaint of Right hand painful (Fingers blue and numb/)   1. Hypothenar hammer syndrome ([place]) Keeps hands warm Pain  contract signed - [employer] (MW), Urine    The above assessment and management plan was discussed with the patient. The patient verbalized understanding of and has agreed to the management plan. Patient is aware to call the clinic if symptoms persist or worsen. Patient is aware when to return to the clinic for a follow-up visit. Patient educated on when it is appropriate to go to the emergency department.   Mary-Margaret Hassell Done, FNP

## 2020-12-06 ENCOUNTER — Telehealth: Payer: Self-pay

## 2020-12-06 NOTE — Telephone Encounter (Signed)
Patient calls today requesting to talk to Dr. Donnetta Hutching concerning severe pain in his hand that keeps him up at night and only allows him to sleep about 15 minutes at a time. He was seen as an urgent referral in [place] on 12/01/20. He denies any wounds. States, "You don't understand, I'm about to cut off these fingers." I advised the patient that Dr. Donnetta Hutching would not be able to help further as surgically we could not intervene and his pain management should come from PCP. He says they have prescribed him tramadol but it is not working. I encouraged him to tell his PCP that the medicine is ineffective. He would still like to talk to Dr. Donnetta Hutching. I told him I would talk to Dr. Donnetta Hutching and get back with him. Discussed with Dr. Donnetta Hutching - MD has personally called to make an urgent referral to Roberts Gaudy at [facility] who specializes in microvascular surgery. He impressed upon them it needed to be as soon as possible.When I hung up with Dr. Donnetta Hutching - patient's retired PCP Dr. Laurance Flatten called to inform [facility] about patient's continued pain. I explained what Dr. Donnetta Hutching had told me and gave him the number to [facility] so he could also ask about a referral. In addition, I called and left a message on the triage nurse VM at [facility] office to ask about patient's referral. I called patient back and informed him of all this. I gave him the number to their office as well. Advised him this was the next step and instructed to call back if he needed further assistance. He verbalized understanding.

## 2020-12-08 ENCOUNTER — Telehealth: Payer: Self-pay | Admitting: Vascular Surgery

## 2020-12-08 DIAGNOSIS — I7389 Other specified peripheral vascular diseases: Secondary | ICD-10-CM | POA: Insufficient documentation

## 2020-12-08 LAB — TOXASSURE SELECT 13 (MW), URINE

## 2020-12-08 NOTE — Telephone Encounter (Signed)
This is a late entry.  I spoke with a hand surgeon in [place] on 12/03/2020 regarding Dakota Bryant case.  He said that there was a Teaching laboratory technician at [facility] who had an interest in this type of palmar arch ischemia.  I called the [facility] orthopedic referral line and spoke with the referral coordinator for Dakota Bryant.  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  I gave my cell phone contact information to Dr. Nicoletta Dress via the office coordinator and ask for him to call me if there is any questions.  I then spoke with the patient directly told him what I had initiated.  He will continue evaluation at [facility]

## 2020-12-13 DIAGNOSIS — M79601 Pain in right arm: Secondary | ICD-10-CM | POA: Insufficient documentation

## 2020-12-13 DIAGNOSIS — I70228 Atherosclerosis of native arteries of extremities with rest pain, other extremity: Secondary | ICD-10-CM | POA: Insufficient documentation

## 2021-01-12 ENCOUNTER — Ambulatory Visit: Payer: Self-pay | Admitting: Vascular Surgery

## 2021-06-01 ENCOUNTER — Encounter (HOSPITAL_COMMUNITY): Payer: Self-pay | Admitting: Emergency Medicine

## 2021-06-01 ENCOUNTER — Other Ambulatory Visit: Payer: Self-pay

## 2021-06-01 ENCOUNTER — Inpatient Hospital Stay (HOSPITAL_COMMUNITY)
Admission: EM | Admit: 2021-06-01 | Discharge: 2021-06-09 | DRG: 854 | Disposition: A | Discharge status: Home / Self Care | Payer: Self-pay | Attending: Family Medicine | Admitting: Family Medicine

## 2021-06-01 DIAGNOSIS — K631 Perforation of intestine (nontraumatic): Secondary | ICD-10-CM

## 2021-06-01 DIAGNOSIS — C3432 Malignant neoplasm of lower lobe, left bronchus or lung: Secondary | ICD-10-CM | POA: Diagnosis present

## 2021-06-01 DIAGNOSIS — F17210 Nicotine dependence, cigarettes, uncomplicated: Secondary | ICD-10-CM | POA: Diagnosis present

## 2021-06-01 DIAGNOSIS — Z880 Allergy status to penicillin: Secondary | ICD-10-CM

## 2021-06-01 DIAGNOSIS — K5792 Diverticulitis of intestine, part unspecified, without perforation or abscess without bleeding: Secondary | ICD-10-CM

## 2021-06-01 DIAGNOSIS — Z716 Tobacco abuse counseling: Secondary | ICD-10-CM

## 2021-06-01 DIAGNOSIS — R918 Other nonspecific abnormal finding of lung field: Secondary | ICD-10-CM | POA: Diagnosis present

## 2021-06-01 DIAGNOSIS — K219 Gastro-esophageal reflux disease without esophagitis: Secondary | ICD-10-CM

## 2021-06-01 DIAGNOSIS — Z7141 Alcohol abuse counseling and surveillance of alcoholic: Secondary | ICD-10-CM

## 2021-06-01 DIAGNOSIS — K5732 Diverticulitis of large intestine without perforation or abscess without bleeding: Secondary | ICD-10-CM

## 2021-06-01 DIAGNOSIS — Z20822 Contact with and (suspected) exposure to covid-19: Secondary | ICD-10-CM | POA: Diagnosis present

## 2021-06-01 DIAGNOSIS — R188 Other ascites: Secondary | ICD-10-CM | POA: Diagnosis present

## 2021-06-01 DIAGNOSIS — F10139 Alcohol abuse with withdrawal, unspecified: Secondary | ICD-10-CM | POA: Diagnosis present

## 2021-06-01 DIAGNOSIS — Z79899 Other long term (current) drug therapy: Secondary | ICD-10-CM

## 2021-06-01 DIAGNOSIS — A419 Sepsis, unspecified organism: Principal | ICD-10-CM

## 2021-06-01 DIAGNOSIS — K5720 Diverticulitis of large intestine with perforation and abscess without bleeding: Principal | ICD-10-CM | POA: Diagnosis present

## 2021-06-01 DIAGNOSIS — I10 Essential (primary) hypertension: Secondary | ICD-10-CM | POA: Diagnosis present

## 2021-06-01 DIAGNOSIS — R911 Solitary pulmonary nodule: Secondary | ICD-10-CM | POA: Diagnosis present

## 2021-06-01 DIAGNOSIS — I160 Hypertensive urgency: Secondary | ICD-10-CM

## 2021-06-01 LAB — CBC
HCT: 55.2 % — ABNORMAL HIGH (ref 39.0–52.0)
Hemoglobin: 18.9 g/dL — ABNORMAL HIGH (ref 13.0–17.0)
MCH: 32.8 pg (ref 26.0–34.0)
MCHC: 34.2 g/dL (ref 30.0–36.0)
MCV: 95.7 fL (ref 80.0–100.0)
Platelets: 270 10*3/uL (ref 150–400)
RBC: 5.77 MIL/uL (ref 4.22–5.81)
RDW: 12.5 % (ref 11.5–15.5)
WBC: 28.6 10*3/uL — ABNORMAL HIGH (ref 4.0–10.5)
nRBC: 0 % (ref 0.0–0.2)

## 2021-06-01 LAB — COMPREHENSIVE METABOLIC PANEL
ALT: 13 U/L (ref 0–44)
AST: 14 U/L — ABNORMAL LOW (ref 15–41)
Albumin: 4.3 g/dL (ref 3.5–5.0)
Alkaline Phosphatase: 57 U/L (ref 38–126)
Anion gap: 14 (ref 5–15)
BUN: 11 mg/dL (ref 6–20)
CO2: 21 mmol/L — ABNORMAL LOW (ref 22–32)
Calcium: 9.3 mg/dL (ref 8.9–10.3)
Chloride: 99 mmol/L (ref 98–111)
Creatinine, Ser: 0.78 mg/dL (ref 0.61–1.24)
GFR, Estimated: >60 mL/min (ref >60)
Glucose, Bld: 119 mg/dL — ABNORMAL HIGH (ref 70–99)
Potassium: 3.5 mmol/L (ref 3.5–5.1)
Sodium: 134 mmol/L — ABNORMAL LOW (ref 135–145)
Total Bilirubin: 1.8 mg/dL — ABNORMAL HIGH (ref 0.3–1.2)
Total Protein: 7.6 g/dL (ref 6.5–8.1)

## 2021-06-01 LAB — LIPASE, BLOOD: Lipase: 24 U/L (ref 11–51)

## 2021-06-01 MED ORDER — ONDANSETRON HCL 4 MG/2ML IJ SOLN
4.0000 mg | Freq: Once | INTRAMUSCULAR | Status: AC
  Administered 2021-06-02: 4 mg via INTRAVENOUS
  Filled 2021-06-01: qty 2

## 2021-06-01 MED ORDER — HYDROMORPHONE HCL 1 MG/ML IJ SOLN
1.0000 mg | Freq: Once | INTRAMUSCULAR | Status: AC
  Administered 2021-06-02: 1 mg via INTRAVENOUS
  Filled 2021-06-01: qty 1

## 2021-06-01 MED ORDER — SODIUM CHLORIDE 0.9 % IV BOLUS
1000.0000 mL | Freq: Once | INTRAVENOUS | Status: AC
  Administered 2021-06-02: 1000 mL via INTRAVENOUS

## 2021-06-01 NOTE — ED Provider Notes (Signed)
?[facility] ?Provider Note ? ? ?CSN: [id number] ?Arrival date & time: 06/01/21  2037 ? ?  ? ?History ? ?Chief Complaint  ?Patient presents with  ? Abdominal Pain  ? ? ?Dakota Bryant is a 51 y.o. male. ? ?Patient is a 51 year old male with past medical history of acid reflux.  Patient presenting today with complaints of abdominal pain.  This started 2 days ago.  Pain is all throughout his abdomen and constant.  He reports loose stools earlier today.  He has felt chilled, but denies fever. ? ?The history is provided by the patient.  ?Abdominal Pain ?Pain location:  Generalized ?Pain quality: cramping   ?Pain radiates to:  Does not radiate ?Pain severity:  Severe ?Onset quality:  Sudden ?Duration:  3 days ?Timing:  Constant ?Progression:  Worsening ?Chronicity:  New ?Relieved by:  Nothing ?Worsened by:  Nothing ?Ineffective treatments:  None tried ? ?  ? ?Home Medications ?Prior to Admission medications   ?Medication Sig Start Date End Date Taking? Authorizing Provider  ?esomeprazole (NEXIUM) 20 MG capsule Take 20 mg by mouth daily at 12 noon.    [provider]  ?ibuprofen (ADVIL,MOTRIN) 200 MG tablet Take 200-400 mg by mouth every 6 (six) hours as needed for mild pain or moderate pain.    [provider]  ?   ? ?Allergies    ?Penicillins   ? ?Review of Systems   ?Review of Systems  ?Gastrointestinal:  Positive for abdominal pain.  ?All other systems reviewed and are negative. ? ?Physical Exam ?Updated Vital Signs ?BP (!) 142/102 (BP Location: Right Arm)   Pulse (!) 138   Temp 97.9 ?F (36.6 ?C) (Oral)   Resp 20   Ht 5\' 6"  (1.676 m)   Wt 59 kg   SpO2 96%   BMI 20.98 kg/m?  ?Physical Exam ?Vitals and nursing note reviewed.  ?Constitutional:   ?   General: He is not in acute distress. ?   Appearance: He is well-developed. He is not diaphoretic.  ?HENT:  ?   Head: Normocephalic and atraumatic.  ?Cardiovascular:  ?   Rate and Rhythm: Normal rate and regular rhythm.  ?   Heart  sounds: No murmur heard. ?  No friction rub.  ?Pulmonary:  ?   Effort: Pulmonary effort is normal. No respiratory distress.  ?   Breath sounds: Normal breath sounds. No wheezing or rales.  ?Abdominal:  ?   General: Bowel sounds are normal. There is no distension.  ?   Palpations: Abdomen is soft.  ?   Tenderness: There is generalized abdominal tenderness. There is no right CVA tenderness, left CVA tenderness, guarding or rebound.  ?Musculoskeletal:     ?   General: Normal range of motion.  ?   Cervical back: Normal range of motion and neck supple.  ?Skin: ?   General: Skin is warm and dry.  ?Neurological:  ?   Mental Status: He is alert and oriented to person, place, and time.  ?   Coordination: Coordination normal.  ? ? ?ED Results / Procedures / Treatments   ?Labs ?(all labs ordered are listed, but only abnormal results are displayed) ?Labs Reviewed  ?COMPREHENSIVE METABOLIC PANEL - Abnormal; Notable for the following components:  ?    Result Value  ? Sodium 134 (*)   ? CO2 21 (*)   ? Glucose, Bld 119 (*)   ? AST 14 (*)   ? Total Bilirubin 1.8 (*)   ? All  other components within normal limits  ?CBC - Abnormal; Notable for the following components:  ? WBC 28.6 (*)   ? Hemoglobin 18.9 (*)   ? HCT 55.2 (*)   ? All other components within normal limits  ?LIPASE, BLOOD  ?URINALYSIS, ROUTINE W REFLEX MICROSCOPIC  ? ? ?EKG ?None ? ?Radiology ?No results found. ? ?Procedures ?Procedures  ? ? ?Medications Ordered in ED ?Medications  ?sodium chloride 0.9 % bolus 1,000 mL (has no administration in time range)  ?ondansetron (ZOFRAN) injection 4 mg (has no administration in time range)  ?HYDROmorphone (DILAUDID) injection 1 mg (has no administration in time range)  ? ? ?ED Course/ Medical Decision Making/ A&P ? ?This patient presents to the ED for concern of abdominal pain, this involves an extensive number of treatment options, and is a complaint that carries with it a high risk of complications and morbidity.  The  differential diagnosis includes diverticulitis, cholecystitis, appendicitis, peritonitis, perforated viscus ? ? ?Co morbidities that complicate the patient evaluation ? ?None ? ? ?Additional history obtained: ? ?No additional history or outside records needed ? ? ?Lab Tests: ? ?I Ordered, and personally interpreted labs.  The pertinent results include: White count of 29,000, but laboratory studies otherwise unremarkable ? ? ?Imaging Studies ordered: ? ?I ordered imaging studies including CT scan of the abdomen and pelvis ?I independently visualized and interpreted imaging which showed perforated diverticulitis ?I agree with the radiologist interpretation ? ? ?Cardiac Monitoring: ? ?None ? ? ?Medicines ordered and prescription drug management: ? ?I ordered medication including Dilaudid for pain and Zofran for nausea ?Reevaluation of the patient after these medicines showed that the patient improved ?I have reviewed the patients home medicines and have made adjustments as needed ? ? ?Test Considered: ? ?No other test considered ? ? ?Critical Interventions: ? ?IV fluids and pain medication ? ? ?Consultations Obtained: ? ?I requested consultation with Dr. Constance Haw from general surgery.  She is requesting patient be admitted to the hospitalist service for IV antibiotics pain control.  She will see the patient in the morning. ?I have spoken with Dr. Sidney Ace from the hospitalist service who agrees to admit. ? ? ?Problem List / ED Course: ? ?Patient presenting here with a 3-day history of worsening abdominal pain.  Became severe this evening.  Laboratory studies revealed white count of 29,000 and CT scan shows what appears to be perforated diverticulitis.  This finding was discussed with general surgery and patient will be admitted to the hospitalist service.  IV Cipro and Flagyl administered as well as pain and nausea medicine. ? ? ? ?Social Determinants of Health: ? ?None ? ?CRITICAL CARE ?Performed by: Veryl Speak ?Total  critical care time: 35 minutes ?Critical care time was exclusive of separately billable procedures and treating other patients. ?Critical care was necessary to treat or prevent imminent or life-threatening deterioration. ?Critical care was time spent personally by me on the following activities: development of treatment plan with patient and/or surrogate as well as nursing, discussions with consultants, evaluation of patient's response to treatment, examination of patient, obtaining history from patient or surrogate, ordering and performing treatments and interventions, ordering and review of laboratory studies, ordering and review of radiographic studies, pulse oximetry and re-evaluation of patient's condition. ? ? ? ? ? ?Final Clinical Impression(s) / ED Diagnoses ?Final diagnoses:  ?None  ? ? ?Rx / DC Orders ?ED Discharge Orders   ? ? None  ? ?  ? ? ?  ?Veryl Speak, MD ?06/02/21 6283 ? ?

## 2021-06-01 NOTE — ED Triage Notes (Signed)
Pt states he took meds this am for constipation and since then has had watery/dark stools. Pt states he is not urinating normally. Pt c/o chills and lower back pain as well as abd pain.  ?

## 2021-06-02 ENCOUNTER — Emergency Department (HOSPITAL_COMMUNITY): Payer: Self-pay

## 2021-06-02 DIAGNOSIS — K5792 Diverticulitis of intestine, part unspecified, without perforation or abscess without bleeding: Secondary | ICD-10-CM

## 2021-06-02 DIAGNOSIS — K631 Perforation of intestine (nontraumatic): Secondary | ICD-10-CM

## 2021-06-02 DIAGNOSIS — K219 Gastro-esophageal reflux disease without esophagitis: Secondary | ICD-10-CM

## 2021-06-02 DIAGNOSIS — A419 Sepsis, unspecified organism: Secondary | ICD-10-CM

## 2021-06-02 DIAGNOSIS — I160 Hypertensive urgency: Secondary | ICD-10-CM

## 2021-06-02 LAB — HIV ANTIBODY (ROUTINE TESTING W REFLEX): HIV Screen 4th Generation wRfx: NONREACTIVE

## 2021-06-02 LAB — LACTIC ACID, PLASMA
Lactic Acid, Venous: 0.9 mmol/L (ref 0.5–1.9)
Lactic Acid, Venous: 0.9 mmol/L (ref 0.5–1.9)

## 2021-06-02 LAB — CBC
HCT: 51.5 % (ref 39.0–52.0)
Hemoglobin: 17.6 g/dL — ABNORMAL HIGH (ref 13.0–17.0)
MCH: 32.9 pg (ref 26.0–34.0)
MCHC: 34.2 g/dL (ref 30.0–36.0)
MCV: 96.3 fL (ref 80.0–100.0)
Platelets: 249 10*3/uL (ref 150–400)
RBC: 5.35 MIL/uL (ref 4.22–5.81)
RDW: 12.7 % (ref 11.5–15.5)
WBC: 25.1 10*3/uL — ABNORMAL HIGH (ref 4.0–10.5)
nRBC: 0 % (ref 0.0–0.2)

## 2021-06-02 LAB — BASIC METABOLIC PANEL
Anion gap: 10 (ref 5–15)
BUN: 10 mg/dL (ref 6–20)
CO2: 24 mmol/L (ref 22–32)
Calcium: 8.6 mg/dL — ABNORMAL LOW (ref 8.9–10.3)
Chloride: 101 mmol/L (ref 98–111)
Creatinine, Ser: 0.82 mg/dL (ref 0.61–1.24)
GFR, Estimated: >60 mL/min (ref >60)
Glucose, Bld: 104 mg/dL — ABNORMAL HIGH (ref 70–99)
Potassium: 4 mmol/L (ref 3.5–5.1)
Sodium: 135 mmol/L (ref 135–145)

## 2021-06-02 LAB — HEMOGLOBIN AND HEMATOCRIT, BLOOD
HCT: 50.8 % (ref 39.0–52.0)
Hemoglobin: 17.4 g/dL — ABNORMAL HIGH (ref 13.0–17.0)

## 2021-06-02 LAB — RESP PANEL BY RT-PCR (FLU A&B, COVID) ARPGX2
Influenza A by PCR: NEGATIVE
Influenza B by PCR: NEGATIVE
SARS Coronavirus 2 by RT PCR: NEGATIVE

## 2021-06-02 LAB — MRSA NEXT GEN BY PCR, NASAL: MRSA by PCR Next Gen: NOT DETECTED

## 2021-06-02 LAB — PROTIME-INR
INR: 1.2 (ref 0.8–1.2)
Prothrombin Time: 15.4 seconds — ABNORMAL HIGH (ref 11.4–15.2)

## 2021-06-02 LAB — CORTISOL-AM, BLOOD: Cortisol - AM: 15.8 ug/dL (ref 6.7–22.6)

## 2021-06-02 LAB — PROCALCITONIN: Procalcitonin: 4.62 ng/mL

## 2021-06-02 MED ORDER — SALINE SPRAY 0.65 % NA SOLN
1.0000 | NASAL | Status: DC | PRN
  Filled 2021-06-02: qty 44

## 2021-06-02 MED ORDER — POTASSIUM CHLORIDE IN NACL 20-0.9 MEQ/L-% IV SOLN
INTRAVENOUS | Status: DC
  Filled 2021-06-02: qty 1000

## 2021-06-02 MED ORDER — ONDANSETRON HCL 4 MG PO TABS: 4.0000 mg | ORAL_TABLET | Freq: Four times a day (QID) | ORAL | Status: DC | PRN

## 2021-06-02 MED ORDER — METRONIDAZOLE 500 MG/100ML IV SOLN
500.0000 mg | Freq: Once | INTRAVENOUS | Status: AC
  Administered 2021-06-02: 500 mg via INTRAVENOUS
  Filled 2021-06-02: qty 100

## 2021-06-02 MED ORDER — HYDROMORPHONE HCL 1 MG/ML IJ SOLN
1.0000 mg | Freq: Once | INTRAMUSCULAR | Status: AC
  Administered 2021-06-02: 1 mg via INTRAVENOUS
  Filled 2021-06-02: qty 1

## 2021-06-02 MED ORDER — ACETAMINOPHEN 650 MG RE SUPP: 650.0000 mg | Freq: Four times a day (QID) | RECTAL | Status: DC | PRN

## 2021-06-02 MED ORDER — TRAZODONE HCL 50 MG PO TABS
25.0000 mg | ORAL_TABLET | Freq: Every evening | ORAL | Status: DC | PRN
  Administered 2021-06-04 – 2021-06-07 (×3): 25 mg via ORAL
  Filled 2021-06-02 (×5): qty 1

## 2021-06-02 MED ORDER — PANTOPRAZOLE SODIUM 40 MG IV SOLR
40.0000 mg | Freq: Every day | INTRAVENOUS | Status: DC
  Administered 2021-06-02 – 2021-06-08 (×7): 40 mg via INTRAVENOUS
  Filled 2021-06-02 (×7): qty 10

## 2021-06-02 MED ORDER — SODIUM CHLORIDE 0.9 % IV SOLN
2.0000 g | Freq: Three times a day (TID) | INTRAVENOUS | Status: DC
  Administered 2021-06-02: 2 g via INTRAVENOUS
  Filled 2021-06-02 (×5): qty 2

## 2021-06-02 MED ORDER — ACETAMINOPHEN 325 MG PO TABS: 650.0000 mg | ORAL_TABLET | Freq: Four times a day (QID) | ORAL | Status: DC | PRN

## 2021-06-02 MED ORDER — DIPHENHYDRAMINE HCL 50 MG/ML IJ SOLN
12.5000 mg | Freq: Four times a day (QID) | INTRAMUSCULAR | Status: DC | PRN
  Administered 2021-06-07: 12.5 mg via INTRAVENOUS
  Filled 2021-06-02: qty 1

## 2021-06-02 MED ORDER — LACTATED RINGERS IV BOLUS
1000.0000 mL | Freq: Once | INTRAVENOUS | Status: AC
  Administered 2021-06-02: 1000 mL via INTRAVENOUS

## 2021-06-02 MED ORDER — CHLORHEXIDINE GLUCONATE CLOTH 2 % EX PADS
6.0000 | MEDICATED_PAD | Freq: Every day | CUTANEOUS | Status: DC
  Administered 2021-06-02 – 2021-06-09 (×8): 6 via TOPICAL

## 2021-06-02 MED ORDER — ONDANSETRON HCL 4 MG/2ML IJ SOLN
4.0000 mg | Freq: Four times a day (QID) | INTRAMUSCULAR | Status: DC | PRN
  Administered 2021-06-06: 4 mg via INTRAVENOUS
  Filled 2021-06-02: qty 2

## 2021-06-02 MED ORDER — CIPROFLOXACIN IN D5W 400 MG/200ML IV SOLN
400.0000 mg | Freq: Two times a day (BID) | INTRAVENOUS | Status: DC
Start: 2021-06-02 — End: 2021-06-06
  Administered 2021-06-02 – 2021-06-06 (×9): 400 mg via INTRAVENOUS
  Filled 2021-06-02 (×10): qty 200

## 2021-06-02 MED ORDER — METOPROLOL TARTRATE 5 MG/5ML IV SOLN
5.0000 mg | Freq: Four times a day (QID) | INTRAVENOUS | Status: AC | PRN
  Administered 2021-06-02 – 2021-06-04 (×4): 5 mg via INTRAVENOUS
  Filled 2021-06-02 (×5): qty 5

## 2021-06-02 MED ORDER — METHOCARBAMOL 500 MG PO TABS: 500.0000 mg | ORAL_TABLET | Freq: Four times a day (QID) | ORAL | Status: DC | PRN

## 2021-06-02 MED ORDER — DIPHENHYDRAMINE HCL 12.5 MG/5ML PO ELIX: 12.5000 mg | ORAL_SOLUTION | Freq: Four times a day (QID) | ORAL | Status: DC | PRN

## 2021-06-02 MED ORDER — HYDROMORPHONE HCL 1 MG/ML IJ SOLN
1.0000 mg | INTRAMUSCULAR | Status: DC | PRN
Start: 2021-06-02 — End: 2021-06-03
  Administered 2021-06-02 – 2021-06-03 (×7): 1 mg via INTRAVENOUS
  Filled 2021-06-02 (×7): qty 1

## 2021-06-02 MED ORDER — CIPROFLOXACIN IN D5W 400 MG/200ML IV SOLN
400.0000 mg | Freq: Once | INTRAVENOUS | Status: AC
  Administered 2021-06-02: 400 mg via INTRAVENOUS
  Filled 2021-06-02: qty 200

## 2021-06-02 MED ORDER — OXYCODONE HCL 5 MG PO TABS
5.0000 mg | ORAL_TABLET | ORAL | Status: DC | PRN
  Administered 2021-06-02 – 2021-06-03 (×2): 5 mg via ORAL
  Administered 2021-06-03: 10 mg via ORAL
  Administered 2021-06-07: 5 mg via ORAL
  Administered 2021-06-07: 10 mg via ORAL
  Administered 2021-06-08: 5 mg via ORAL
  Administered 2021-06-09: 10 mg via ORAL
  Filled 2021-06-02 (×2): qty 2
  Filled 2021-06-02 (×4): qty 1
  Filled 2021-06-02: qty 2
  Filled 2021-06-02: qty 1

## 2021-06-02 MED ORDER — METRONIDAZOLE 500 MG/100ML IV SOLN: 500.0000 mg | Freq: Two times a day (BID) | INTRAVENOUS | Status: DC

## 2021-06-02 MED ORDER — SIMETHICONE 80 MG PO CHEW
40.0000 mg | CHEWABLE_TABLET | Freq: Four times a day (QID) | ORAL | Status: DC | PRN
  Administered 2021-06-05 – 2021-06-06 (×2): 40 mg via ORAL
  Filled 2021-06-02 (×4): qty 1

## 2021-06-02 MED ORDER — ENOXAPARIN SODIUM 40 MG/0.4ML IJ SOSY: 40.0000 mg | PREFILLED_SYRINGE | INTRAMUSCULAR | Status: DC

## 2021-06-02 MED ORDER — SODIUM CHLORIDE 0.9 % IV SOLN: 2.0000 g | Freq: Once | INTRAVENOUS | Status: DC

## 2021-06-02 MED ORDER — ZOLPIDEM TARTRATE 5 MG PO TABS: 5.0000 mg | ORAL_TABLET | Freq: Every evening | ORAL | Status: DC | PRN

## 2021-06-02 MED ORDER — IOHEXOL 300 MG/ML  SOLN
100.0000 mL | Freq: Once | INTRAMUSCULAR | Status: AC | PRN
  Administered 2021-06-02: 100 mL via INTRAVENOUS

## 2021-06-02 MED ORDER — METRONIDAZOLE 500 MG/100ML IV SOLN
500.0000 mg | Freq: Two times a day (BID) | INTRAVENOUS | Status: DC
  Administered 2021-06-02 – 2021-06-06 (×9): 500 mg via INTRAVENOUS
  Filled 2021-06-02 (×9): qty 100

## 2021-06-02 MED ORDER — MORPHINE SULFATE (PF) 2 MG/ML IV SOLN
2.0000 mg | INTRAVENOUS | Status: DC | PRN
  Administered 2021-06-02: 2 mg via INTRAVENOUS
  Filled 2021-06-02: qty 1

## 2021-06-02 NOTE — Progress Notes (Signed)
[facility] ? ?Reviewed imaging. Diverticulitis with perforation, small pockets of air but no fluid or abscess. Reported to be tender. Tachycardic but improved with fluids. ? ?Cipro Flagyl ?NPO ?PRN for pain ?Will attempt non operative treatment, get him resuscitated with another liter of fluid and antibiotics started. ?May ultimately need colostomy if does not improve clinically. ?Hospitalist admission, will take over in AM given limited PMH. ? ?Discussed with Dr. Stark Jock. ? ?Curlene Labrum, MD ?[facility] ?[place], [place] [address] ?[phone] (office) ? ? ?

## 2021-06-02 NOTE — Progress Notes (Signed)
Pharmacy Antibiotic Note ? ?Dakota Bryant is a 51 y.o. male admitted on 06/01/2021 with  intra-abdominal infection .  Pharmacy has been consulted for aztreonam/cefepime dosing. ? ?WBC 28.6 (elevated) ?Temp (24hrs), Avg:98.1 ?F (36.7 ?C), Min:97.9 ?F (36.6 ?C), Max:98.3 ?F (36.8 ?C) ? ?Plan: ?Aztreonam IV 2 g  every 8 hours ?Monitor CBC, Temps ? ? ?Height: 5\' 6"  (167.6 cm) ?Weight: 59 kg (130 lb) ?IBW/kg (Calculated) : 63.8 ? ? ?Recent Labs  ?Lab 06/01/21 ?2131  ?WBC 28.6*  ?CREATININE 0.78  ?  ?Estimated Creatinine Clearance: 90.1 mL/min (by C-G formula based on SCr of 0.78 mg/dL).   ? ?Allergies  ?Allergen Reactions  ? Penicillins Other (See Comments)  ?  Did it involve swelling of the face/tongue/throat, SOB, or low BP? Unknown ?Did it involve sudden or severe rash/hives, skin peeling, or any reaction on the inside of your mouth or nose? Unknown ?Did you need to seek medical attention at a hospital or doctor's office? Unknown ?When did it last happen?       ?If all above answers are ?NO?, may proceed with cephalosporin use. ?  ? ? ?Antimicrobials this admission: ?Aztreonam 3/16 >> ?Ciprofloxacin 3/16 >> ?Metronidazole 3/16 >> ? ?Thank you for allowing pharmacy to be a part of this patient?s care. ? ?Carma Lair, PharmD Candidate [phone] ?06/02/2021 2:00 AM ? ?

## 2021-06-02 NOTE — H&P (View-Only) (Signed)
I was present with the medical student for this service. I personally verified the history of present illness, performed the physical exam, and made the plan for this encounter. I have verified the medical student's documentation and made modifications where appropriately. I have personally documented in my own words a brief history, physical, and plan below.    ? ?Having lower abdominal pain and constipation followed by diarrhea with laxative. He had a colonoscopy in the past but nothing recently. He is unaware of any Crohn's or IBD in the family but says there is some cancers. He is unsure. ? ?He had a bloody Bm this Am which could be related to diverticulitis versus colitis. Exam reassuring. IVF, IV antibiotics, NPO with ice.  ? ?We have discussed that diverticulitis is a spectrum of disease. We discussed that it ranges from simple diverticulitis that can be treated with oral antibiotics as an outpatient to severe cases with perforations that require emergency surgery and colostomy.  We discussed that there are cases that are intermediate or complicated and require hospitalization for IV antibiotics and sometimes require Interventional Radiology drainage of abscesses or fluid collections that form.  We have discussed that some people still require emergency surgery if their case worsens and that during the acute inflammation and infection period a colostomy. We discussed that most people are able to be discharged with antibiotics +/- a drain, and we discuss the options of elective colectomy as an outpatient. We also discussed the potential need for a colonoscopy if there is not a recent study performed.   ? ?At this time I do not think he needs a drain and will monitor for any needs for OR or IR drain in the  future.  ? ?Taking on the surgical service.  ?H&H now.  ? ?Curlene Labrum, MD ?[facility] ?[place], [place] [address] ?[phone] (office) ? ? ? ?Reason for  Consult: Perforated Diverticulitis  ?Referring Physician: Eugenie Norrie, MD ? ?Dakota Bryant is an 51 y.o. male.  ?HPI: Dakota Bryant is a 51 y.o. male with history of GERD who presented to the ED last night for evaluation of abdominal pain beginning 3 days ago. He states the pain started in his epigastric region and now is present diffusely across his abdomen. He states that his pain was 10/10 at its worst but currently is a 3/10 after pain medication. He states he first thought this pain was related to constipation given that he has had similar pain in the past with constipation, but he states that his pain was not relieved when he passed a large BM, prompting him to present to the ED. He endorses belching, chills but denies nausea, vomiting. He states that he had a colonoscopy around the age of 9 that was normal to his knowledge. He does not know if/when he was supposed to follow up for repeat colonoscopy.  ? ?History reviewed. No pertinent past medical history. ? ?Past Surgical History:  ?Procedure Laterality Date  ? HAND TENDON SURGERY    ? ?Patient unsure of family history ?No family history on file. ? ?Social History:  reports that he has been smoking cigarettes. He has been smoking an average of 1 pack per day. He has never used smokeless tobacco. He reports current alcohol use of about 2.0 standard drinks per week. He reports current drug use. Drug: Marijuana. ? ?Allergies:  ?Allergies  ?Allergen Reactions  ? Penicillins Other (See Comments)  ?  Did it involve swelling  of the face/tongue/throat, SOB, or low BP? Unknown ?Did it involve sudden or severe rash/hives, skin peeling, or any reaction on the inside of your mouth or nose? Unknown ?Did you need to seek medical attention at a hospital or doctor's office? Unknown ?When did it last happen?       ?If all above answers are ?NO?, may proceed with cephalosporin use. ?  ? ? ?Medications: I have reviewed the patient's current medications. ?Current  Facility-Administered Medications  ?Medication Dose Route Frequency Provider Last Rate Last Admin  ? 0.9 % NaCl with KCl 20 mEq/ L  infusion   Intravenous Continuous Mansy, Arvella Merles, MD 125 mL/hr at 06/02/21 0754 New Bag at 06/02/21 0754  ? acetaminophen (TYLENOL) tablet 650 mg  650 mg Oral Q6H PRN Mansy, Jan A, MD      ? Or  ? acetaminophen (TYLENOL) suppository 650 mg  650 mg Rectal Q6H PRN Mansy, Jan A, MD      ? Chlorhexidine Gluconate Cloth 2 % PADS 6 each  6 each Topical Q0600 Kathie Dike, MD   6 each at 06/02/21 0739  ? ciprofloxacin (CIPRO) IVPB 400 mg  400 mg Intravenous Q12H Mansy, Jan A, MD      ? diphenhydrAMINE (BENADRYL) 12.5 MG/5ML elixir 12.5 mg  12.5 mg Oral Q6H PRN Virl Cagey, MD      ? Or  ? diphenhydrAMINE (BENADRYL) injection 12.5 mg  12.5 mg Intravenous Q6H PRN Virl Cagey, MD      ? HYDROmorphone (DILAUDID) injection 1 mg  1 mg Intravenous Q2H PRN Kathie Dike, MD   1 mg at 06/02/21 1002  ? methocarbamol (ROBAXIN) tablet 500 mg  500 mg Oral Q6H PRN Virl Cagey, MD      ? metoprolol tartrate (LOPRESSOR) injection 5 mg  5 mg Intravenous Q6H PRN Virl Cagey, MD      ? metroNIDAZOLE (FLAGYL) IVPB 500 mg  500 mg Intravenous Q12H Mansy, Jan A, MD      ? ondansetron [facility]) tablet 4 mg  4 mg Oral Q6H PRN Mansy, Jan A, MD      ? Or  ? ondansetron [facility]) injection 4 mg  4 mg Intravenous Q6H PRN Mansy, Jan A, MD      ? oxyCODONE (Oxy IR/ROXICODONE) immediate release tablet 5-10 mg  5-10 mg Oral Q4H PRN Virl Cagey, MD      ? pantoprazole (PROTONIX) injection 40 mg  40 mg Intravenous QHS Virl Cagey, MD      ? simethicone (MYLICON) chewable tablet 40 mg  40 mg Oral Q6H PRN Virl Cagey, MD      ? traZODone (DESYREL) tablet 25 mg  25 mg Oral QHS PRN Mansy, Arvella Merles, MD      ?  ?Results for orders placed or performed during the hospital encounter of 06/01/21 (from the past 48 hour(s))  ?Lipase, blood     Status: None  ? Collection Time: 06/01/21  9:31 PM   ?Result Value Ref Range  ? Lipase 24 11 - 51 U/L  ?  Comment: Performed at [facility], [address] [place],  [address]  ?Comprehensive metabolic panel     Status: Abnormal  ? Collection Time: 06/01/21  9:31 PM  ?Result Value Ref Range  ? Sodium 134 (L) 135 - 145 mmol/L  ? Potassium 3.5 3.5 - 5.1 mmol/L  ? Chloride 99 98 - 111 mmol/L  ? CO2 21 (L) 22 - 32 mmol/L  ?  Glucose, Bld 119 (H) 70 - 99 mg/dL  ?  Comment: Glucose reference range applies only to samples taken after fasting for at least 8 hours.  ? BUN 11 6 - 20 mg/dL  ? Creatinine, Ser 0.78 0.61 - 1.24 mg/dL  ? Calcium 9.3 8.9 - 10.3 mg/dL  ? Total Protein 7.6 6.5 - 8.1 g/dL  ? Albumin 4.3 3.5 - 5.0 g/dL  ? AST 14 (L) 15 - 41 U/L  ? ALT 13 0 - 44 U/L  ? Alkaline Phosphatase 57 38 - 126 U/L  ? Total Bilirubin 1.8 (H) 0.3 - 1.2 mg/dL  ? GFR, Estimated >60 >60 mL/min  ?  Comment: (NOTE) ?Calculated using the CKD-EPI Creatinine Equation (2021) ?  ? Anion gap 14 5 - 15  ?  Comment: Performed at [facility], [address] [place], [place] [address]  ?CBC     Status: Abnormal  ? Collection Time: 06/01/21  9:31 PM  ?Result Value Ref Range  ? WBC 28.6 (H) 4.0 - 10.5 K/uL  ? RBC 5.77 4.22 - 5.81 MIL/uL  ? Hemoglobin 18.9 (H) 13.0 - 17.0 g/dL  ? HCT 55.2 (H) 39.0 - 52.0 %  ? MCV 95.7 80.0 - 100.0 fL  ? MCH 32.8 26.0 - 34.0 pg  ? MCHC 34.2 30.0 - 36.0 g/dL  ? RDW 12.5 11.5 - 15.5 %  ? Platelets 270 150 - 400 K/uL  ? nRBC 0.0 0.0 - 0.2 %  ?  Comment: Performed at [facility], [address], [place], [place] [address]  ?Resp Panel by RT-PCR (Flu A&B, Covid) Nasopharyngeal Swab     Status: None  ? Collection Time: 06/02/21  1:24 AM  ? Specimen: Nasopharyngeal Swab; Nasopharyngeal(NP) swabs in vial transport medium  ?Result Value Ref Range  ? SARS Coronavirus 2 by RT PCR NEGATIVE NEGATIVE  ?  Comment: (NOTE) ?SARS-CoV-2 target nucleic acids are NOT DETECTED. ? ?The SARS-CoV-2 RNA is generally detectable in upper respiratory ?specimens during the acute phase of  infection. The lowest ?concentration of SARS-CoV-2 viral copies this assay can detect is ?138 copies/mL. A negative result does not preclude SARS-Cov-2 ?infection and should not be used as the sole basis for

## 2021-06-02 NOTE — ED Notes (Signed)
hospitalist in room  

## 2021-06-02 NOTE — Assessment & Plan Note (Addendum)
-  Management as above. ?-Status post surgical intervention for sigmoidectomy. ?

## 2021-06-02 NOTE — Assessment & Plan Note (Addendum)
-   Patient ended requiring to be placed on Cardene ?-Labetalol has been also added ?-Will continue as needed hydralazine ?-Wean off Cardene drip as tolerated. ?-Part of his hypertension is driven by pain and alcohol withdrawal. ?

## 2021-06-02 NOTE — Consult Note (Addendum)
I was present with the medical student for this service. I personally verified the history of present illness, performed the physical exam, and made the plan for this encounter. I have verified the medical student's documentation and made modifications where appropriately. I have personally documented in my own words a brief history, physical, and plan below.     Having lower abdominal pain and constipation followed by diarrhea with laxative. He had a colonoscopy in the past but nothing recently. He is unaware of any Crohn's or IBD in the family but says there is some cancers. He is unsure.  He had a bloody Bm this Am which could be related to diverticulitis versus colitis. Exam reassuring. IVF, IV antibiotics, NPO with ice.   We have discussed that diverticulitis is a spectrum of disease. We discussed that it ranges from simple diverticulitis that can be treated with oral antibiotics as an outpatient to severe cases with perforations that require emergency surgery and colostomy.  We discussed that there are cases that are intermediate or complicated and require hospitalization for IV antibiotics and sometimes require Interventional Radiology drainage of abscesses or fluid collections that form.  We have discussed that some people still require emergency surgery if their case worsens and that during the acute inflammation and infection period a colostomy. We discussed that most people are able to be discharged with antibiotics +/- a drain, and we discuss the options of elective colectomy as an outpatient. We also discussed the potential need for a colonoscopy if there is not a recent study performed.    At this time I do not think he needs a drain and will monitor for any needs for OR or IR drain in the  future.   Taking on the surgical service.  H&H now.   [name], MD [facility] [address] [place], [place] [address] [phone] (office)    Reason for  Consult: Perforated Diverticulitis  Referring Physician: Valente David, MD  Dakota Bryant is an 51 y.o. male.  HPI: Dakota Bryant is a 51 y.o. male with history of GERD who presented to the ED last night for evaluation of abdominal pain beginning 3 days ago. He states the pain started in his epigastric region and now is present diffusely across his abdomen. He states that his pain was 10/10 at its worst but currently is a 3/10 after pain medication. He states he first thought this pain was related to constipation given that he has had similar pain in the past with constipation, but he states that his pain was not relieved when he passed a large BM, prompting him to present to the ED. He endorses belching, chills but denies nausea, vomiting. He states that he had a colonoscopy around the age of 3 that was normal to his knowledge. He does not know if/when he was supposed to follow up for repeat colonoscopy.   History reviewed. No pertinent past medical history.  Past Surgical History:  Procedure Laterality Date   HAND TENDON SURGERY     Patient unsure of family history No family history on file.  Social History:  reports that he has been smoking cigarettes. He has been smoking an average of 1 pack per day. He has never used smokeless tobacco. He reports current alcohol use of about 2.0 standard drinks per week. He reports current drug use. Drug: Marijuana.  Allergies:  Allergies  Allergen Reactions   Penicillins Other (See Comments)    Did it involve swelling  of the face/tongue/throat, SOB, or low BP? Unknown Did it involve sudden or severe rash/hives, skin peeling, or any reaction on the inside of your mouth or nose? Unknown Did you need to seek medical attention at a hospital or doctor's office? Unknown When did it last happen?       If all above answers are NO, may proceed with cephalosporin use.     Medications: I have reviewed the patient's current medications. Current  Facility-Administered Medications  Medication Dose Route Frequency Provider Last Rate Last Admin   0.9 % NaCl with KCl 20 mEq/ L  infusion   Intravenous Continuous Mansy, Jan A, MD 125 mL/hr at 06/02/21 0754 New Bag at 06/02/21 0754   acetaminophen (TYLENOL) tablet 650 mg  650 mg Oral Q6H PRN Mansy, Jan A, MD       Or   acetaminophen (TYLENOL) suppository 650 mg  650 mg Rectal Q6H PRN Mansy, Vernetta Honey, MD       Chlorhexidine Gluconate Cloth 2 % PADS 6 each  6 each Topical Q0600 Erick Blinks, MD   6 each at 06/02/21 0739   ciprofloxacin (CIPRO) IVPB 400 mg  400 mg Intravenous Q12H Mansy, Jan A, MD       diphenhydrAMINE (BENADRYL) 12.5 MG/5ML elixir 12.5 mg  12.5 mg Oral Q6H PRN Lucretia Roers, MD       Or   diphenhydrAMINE (BENADRYL) injection 12.5 mg  12.5 mg Intravenous Q6H PRN Lucretia Roers, MD       HYDROmorphone (DILAUDID) injection 1 mg  1 mg Intravenous Q2H PRN Erick Blinks, MD   1 mg at 06/02/21 1002   methocarbamol (ROBAXIN) tablet 500 mg  500 mg Oral Q6H PRN Lucretia Roers, MD       metoprolol tartrate (LOPRESSOR) injection 5 mg  5 mg Intravenous Q6H PRN Lucretia Roers, MD       metroNIDAZOLE (FLAGYL) IVPB 500 mg  500 mg Intravenous Q12H Mansy, Jan A, MD       ondansetron [facility]) tablet 4 mg  4 mg Oral Q6H PRN Mansy, Jan A, MD       Or   ondansetron [facility]) injection 4 mg  4 mg Intravenous Q6H PRN Mansy, Jan A, MD       oxyCODONE (Oxy IR/ROXICODONE) immediate release tablet 5-10 mg  5-10 mg Oral Q4H PRN Lucretia Roers, MD       pantoprazole (PROTONIX) injection 40 mg  40 mg Intravenous QHS Lucretia Roers, MD       simethicone [facility]) chewable tablet 40 mg  40 mg Oral Q6H PRN Lucretia Roers, MD       traZODone (DESYREL) tablet 25 mg  25 mg Oral QHS PRN Mansy, Vernetta Honey, MD        Results for orders placed or performed during the hospital encounter of 06/01/21 (from the past 48 hour(s))  Lipase, blood     Status: None   Collection Time: 06/01/21  9:31 PM   Result Value Ref Range   Lipase 24 11 - 51 U/L    Comment: Performed at [facility], [address] [place], [place] [address]  Comprehensive metabolic panel     Status: Abnormal   Collection Time: 06/01/21  9:31 PM  Result Value Ref Range   Sodium 134 (L) 135 - 145 mmol/L   Potassium 3.5 3.5 - 5.1 mmol/L   Chloride 99 98 - 111 mmol/L   CO2 21 (L) 22 - 32 mmol/L  Glucose, Bld 119 (H) 70 - 99 mg/dL    Comment: Glucose reference range applies only to samples taken after fasting for at least 8 hours.   BUN 11 6 - 20 mg/dL   Creatinine, Ser 2.44 0.61 - 1.24 mg/dL   Calcium 9.3 8.9 - 01.0 mg/dL   Total Protein 7.6 6.5 - 8.1 g/dL   Albumin 4.3 3.5 - 5.0 g/dL   AST 14 (L) 15 - 41 U/L   ALT 13 0 - 44 U/L   Alkaline Phosphatase 57 38 - 126 U/L   Total Bilirubin 1.8 (H) 0.3 - 1.2 mg/dL   GFR, Estimated >27 >25 mL/min    Comment: (NOTE) Calculated using the CKD-EPI Creatinine Equation (2021)    Anion gap 14 5 - 15    Comment: Performed at [facility], [address] [place], [place] [address]  CBC     Status: Abnormal   Collection Time: 06/01/21  9:31 PM  Result Value Ref Range   WBC 28.6 (H) 4.0 - 10.5 K/uL   RBC 5.77 4.22 - 5.81 MIL/uL   Hemoglobin 18.9 (H) 13.0 - 17.0 g/dL   HCT 03.4 (H) 74.2 - 59.5 %   MCV 95.7 80.0 - 100.0 fL   MCH 32.8 26.0 - 34.0 pg   MCHC 34.2 30.0 - 36.0 g/dL   RDW 63.8 75.6 - 43.3 %   Platelets 270 150 - 400 K/uL   nRBC 0.0 0.0 - 0.2 %    Comment: Performed at [facility], [address], [place], [place] [address]  Resp Panel by RT-PCR (Flu A&B, Covid) Nasopharyngeal Swab     Status: None   Collection Time: 06/02/21  1:24 AM   Specimen: Nasopharyngeal Swab; Nasopharyngeal(NP) swabs in vial transport medium  Result Value Ref Range   SARS Coronavirus 2 by RT PCR NEGATIVE NEGATIVE    Comment: (NOTE) SARS-CoV-2 target nucleic acids are NOT DETECTED.  The SARS-CoV-2 RNA is generally detectable in upper respiratory specimens during the acute phase of  infection. The lowest concentration of SARS-CoV-2 viral copies this assay can detect is 138 copies/mL. A negative result does not preclude SARS-Cov-2 infection and should not be used as the sole basis for treatment or other patient management decisions. A negative result may occur with  improper specimen collection/handling, submission of specimen other than nasopharyngeal swab, presence of viral mutation(s) within the areas targeted by this assay, and inadequate number of viral copies(<138 copies/mL). A negative result must be combined with clinical observations, patient history, and epidemiological information. The expected result is Negative.  Fact Sheet for Patients:  BloggerCourse.com  Fact Sheet for Healthcare Providers:  SeriousBroker.it  This test is no t yet approved or cleared by the [place] FDA and  has been authorized for detection and/or diagnosis of SARS-CoV-2 by FDA under an Emergency Use Authorization (EUA). This EUA will remain  in effect (meaning this test can be used) for the duration of the COVID-19 declaration under Section 564(b)(1) of the Act, 21 U.S.C.section 360bbb-3(b)(1), unless the authorization is terminated  or revoked sooner.       Influenza A by PCR NEGATIVE NEGATIVE   Influenza B by PCR NEGATIVE NEGATIVE    Comment: (NOTE) The Xpert Xpress SARS-CoV-2/FLU/RSV plus assay is intended as an aid in the diagnosis of influenza from Nasopharyngeal swab specimens and should not be used as a sole basis for treatment. Nasal washings and aspirates are unacceptable for Xpert Xpress SARS-CoV-2/FLU/RSV testing.  Fact Sheet for Patients: BloggerCourse.com  Fact Sheet for Healthcare Providers: SeriousBroker.it  This test is not yet approved or cleared by the [place] FDA and has been authorized for detection and/or diagnosis of SARS-CoV-2 by FDA  under an Emergency Use Authorization (EUA). This EUA will remain in effect (meaning this test can be used) for the duration of the COVID-19 declaration under Section 564(b)(1) of the Act, 21 U.S.C. section 360bbb-3(b)(1), unless the authorization is terminated or revoked.  Performed at [facility], [address], [place], [place] [address]   Lactic acid, plasma     Status: None   Collection Time: 06/02/21  1:39 AM  Result Value Ref Range   Lactic Acid, Venous 0.9 0.5 - 1.9 mmol/L    Comment: Performed at [facility], [address], [place], [place] [address]  Lactic acid, plasma     Status: None   Collection Time: 06/02/21  4:39 AM  Result Value Ref Range   Lactic Acid, Venous 0.9 0.5 - 1.9 mmol/L    Comment: Performed at [facility], [address] [place], [place] [address]  Protime-INR     Status: Abnormal   Collection Time: 06/02/21  4:39 AM  Result Value Ref Range   Prothrombin Time 15.4 (H) 11.4 - 15.2 seconds   INR 1.2 0.8 - 1.2    Comment: (NOTE) INR goal varies based on device and disease states. Performed at [facility], [address], [place], [place] [address]   Cortisol-am, blood     Status: None   Collection Time: 06/02/21  4:39 AM  Result Value Ref Range   Cortisol - AM 15.8 6.7 - 22.6 ug/dL    Comment: Performed at [facility], [address] [place], [place] [address]  Procalcitonin     Status: None   Collection Time: 06/02/21  4:39 AM  Result Value Ref Range   Procalcitonin 4.62 ng/mL    Comment:        Interpretation: PCT > 2 ng/mL: Systemic infection (sepsis) is likely, unless other causes are known. (NOTE)       Sepsis PCT Algorithm           Lower Respiratory Tract                                      Infection PCT Algorithm    ----------------------------     ----------------------------         PCT < 0.25 ng/mL                PCT < 0.10 ng/mL          Strongly encourage             Strongly discourage   discontinuation of antibiotics     initiation of antibiotics    ----------------------------     -----------------------------       PCT 0.25 - 0.50 ng/mL            PCT 0.10 - 0.25 ng/mL               OR       >80% decrease in PCT            Discourage initiation of  antibiotics      Encourage discontinuation           of antibiotics    ----------------------------     -----------------------------         PCT >= 0.50 ng/mL              PCT 0.26 - 0.50 ng/mL               AND       <80% decrease in PCT              Encourage initiation of                                             antibiotics       Encourage continuation           of antibiotics    ----------------------------     -----------------------------        PCT >= 0.50 ng/mL                  PCT > 0.50 ng/mL               AND         increase in PCT                  Strongly encourage                                      initiation of antibiotics    Strongly encourage escalation           of antibiotics                                     -----------------------------                                           PCT <= 0.25 ng/mL                                                 OR                                        > 80% decrease in PCT                                      Discontinue / Do not initiate                                             antibiotics  Performed at [facility], [address], [place], [place] [address]   CBC     Status: Abnormal   Collection Time: 06/02/21  4:39  AM  Result Value Ref Range   WBC 25.1 (H) 4.0 - 10.5 K/uL   RBC 5.35 4.22 - 5.81 MIL/uL   Hemoglobin 17.6 (H) 13.0 - 17.0 g/dL   HCT 16.1 09.6 - 04.5 %   MCV 96.3 80.0 - 100.0 fL   MCH 32.9 26.0 - 34.0 pg   MCHC 34.2 30.0 - 36.0 g/dL   RDW 40.9 81.1 - 91.4 %   Platelets 249 150 - 400 K/uL   nRBC 0.0 0.0 - 0.2 %    Comment: Performed at [facility], [address], [place], [place] [address]  Basic metabolic panel      Status: Abnormal   Collection Time: 06/02/21  7:45 AM  Result Value Ref Range   Sodium 135 135 - 145 mmol/L   Potassium 4.0 3.5 - 5.1 mmol/L   Chloride 101 98 - 111 mmol/L   CO2 24 22 - 32 mmol/L   Glucose, Bld 104 (H) 70 - 99 mg/dL    Comment: Glucose reference range applies only to samples taken after fasting for at least 8 hours.   BUN 10 6 - 20 mg/dL   Creatinine, Ser 6.21 0.61 - 1.24 mg/dL   Calcium 8.6 (L) 8.9 - 10.3 mg/dL   GFR, Estimated >30 >86 mL/min    Comment: (NOTE) Calculated using the CKD-EPI Creatinine Equation (2021)    Anion gap 10 5 - 15    Comment: Performed at [facility], [address] [place], [place] [address]    CT ABDOMEN PELVIS W CONTRAST  Result Date: 06/02/2021 CLINICAL DATA:  Abdominal pain. EXAM: CT ABDOMEN AND PELVIS WITH CONTRAST TECHNIQUE: Multidetector CT imaging of the abdomen and pelvis was performed using the standard protocol following bolus administration of intravenous contrast. RADIATION DOSE REDUCTION: This exam was performed according to the departmental dose-optimization program which includes automated exposure control, adjustment of the mA and/or kV according to patient size and/or use of iterative reconstruction technique. CONTRAST:  OMNIPAQUE IOHEXOL 300 MG/ML  SOLN COMPARISON:  CT dated 05/27/2018. FINDINGS: Lower chest: Indeterminate and partially visualized 2.5 x 1.5 cm left infrahilar nodule abutting the descending thoracic aorta. This is new since the prior CT and partially visualized and not evaluated. Dedicated chest CT is recommended for better evaluation. The visualized lung bases are otherwise clear. No intra-abdominal free air.  No significant free fluid. Hepatobiliary: No focal liver abnormality is seen. No gallstones, gallbladder wall thickening, or biliary dilatation. Pancreas: Unremarkable. No pancreatic ductal dilatation or surrounding inflammatory changes. Spleen: Normal in size without focal abnormality. Adrenals/Urinary  Tract: The adrenal glands unremarkable. There is a 1 cm nonobstructing left renal inferior pole calculus. No hydronephrosis. The right kidney is unremarkable. There is symmetric enhancement and excretion of contrast by both kidneys. The visualized ureters and urinary bladder appear unremarkable. Stomach/Bowel: Several sigmoid diverticula noted. There is inflammatory changes of a segment of the sigmoid colon centered high a sigmoid diverticula within the pelvis (67/2 and coronal 59/5) consistent with acute diverticulitis. There is perforation of the inflamed sigmoid diverticula with several scattered focally contained pockets of air. No drainable fluid collection or abscess. Mildly thickened and inflamed loops of distal small bowel in the lower abdomen likely reactive to sigmoid diverticulitis. There is no bowel obstruction. The appendix is not visualized with certainty. No inflammatory changes identified in the right lower quadrant. Vascular/Lymphatic: Moderate aortoiliac atherosclerotic disease. The IVC is unremarkable. No portal venous gas. There is no adenopathy. Reproductive: The prostate and seminal vesicles are grossly  unremarkable. No pelvic mass. Other: None Musculoskeletal: Mild degenerative changes. No acute osseous pathology. IMPRESSION: 1. Acute perforated sigmoid diverticulitis.  No abscess. 2. Indeterminate and partially visualized 2.5 x 1.5 cm left infrahilar nodule, concerning for neoplasm. Dedicated chest CT is recommended for better evaluation. 3. A 1 cm nonobstructing left renal inferior pole calculus. No hydronephrosis. 4. Aortic Atherosclerosis (ICD10-I70.0). These results were called by telephone at the time of interpretation on 06/02/2021 at 12:56 am to provider Geoffery Lyons , who verbally acknowledged these results. Electronically Signed   By: Elgie Collard M.D.   On: 06/02/2021 01:03    ROS:  Pertinent items noted in HPI and remainder of comprehensive ROS otherwise negative.  Blood  pressure (!) 142/90, pulse 87, temperature 97.6 F (36.4 C), temperature source Oral, resp. rate 12, height 5\' 6"  (1.676 m), weight 58.5 kg, SpO2 92 %.   Physical Exam:  Constitutional: tired appearing and sitting in bed, in no acute distress Cardiovascular: regular rate and rhythm, no m/r/g Pulmonary/Chest: normal work of breathing on room air, lungs clear to auscultation bilaterally Abdominal: soft, diffuse tenderness across the abdomen, mildly worse in the lower quadrants, non-distended, bowel sounds present, no rebound or guarding  Skin: warm and dry Neurological: alert and answering questions appropriately    Assessment/Plan: Dakota Bryant is a 51 y.o. male who presented to the ED last night for evaluation of abdominal pain for the past 3 days. He was hypertensive and tachycardic on presentation and labs showed WBC of 20.6 with hemoglobin 18.9. CT abdomen showed acute perforated sigmoid diverticulitis with no abscess. At this time we will proceed with non operative treatment with plan for colostomy if he does not improve clinically.   Continue Cipro, Flagyl Continue NPO Dilaudid PRN for pain   Bishop Limbo 06/02/2021, 9:17 AM

## 2021-06-02 NOTE — ED Notes (Signed)
Pt requesting something for pain. Pt has morphine 3 hours ago. Order is every 4 hours. Assigned attending notified .  ?Pain 8/10 ?

## 2021-06-02 NOTE — H&P (Addendum)
?  ?  ?[facility] ? ? ?PATIENT NAME: Dakota Bryant   ? ?MR#:  [id number] ? ?DATE OF BIRTH:  1970-04-05 ? ?DATE OF ADMISSION:  06/01/2021 ? ?PRIMARY CARE PHYSICIAN: Chipper Herb, MD (Inactive)  ? ?Patient is coming from: Home ? ?REQUESTING/REFERRING PHYSICIAN: Veryl Speak, MD ? ?CHIEF COMPLAINT:  ? ?Chief Complaint  ?Patient presents with  ? Abdominal Pain  ? ? ?HISTORY OF PRESENT ILLNESS:  ?Dakota Bryant is a 51 y.o. male with medical history significant for hypertension, GERD and ongoing tobacco abuse, who presented to the ER with acute onset of worsening abdominal pain over the last 3 days with no nausea or vomiting.  He has been constipated and had a watery bowel movement after using the laxative.  He admitted to tactile fever and chills with diaphoresis.  He denied any chest pain or dyspnea or cough or wheezing.  He has been having diminished urine output.  No dysuria or hematuria, urgency or frequency or flank pain. ? ?ED Course: When he came to the ER, BP was 142/102 with a heart rate of 138 and otherwise normal vital signs.  CMP was remarkable for a sodium of 134, CO2 21 and total bili 1.8, with a lactic acid of 0.9 and CBC showed significant leukocytosis 20.6 with hemoconcentration with hemoglobin 18.9  and hematocrit 55 point.  Influenza antigens and COVID-19 PCR came back negative ? ?Imaging: Abdominal pelvic CT scan with contrast revealed the following: ?1. Acute perforated sigmoid diverticulitis.  No abscess. ?2. Indeterminate and partially visualized 2.5 x 1.5 cm left ?infrahilar nodule, concerning for neoplasm. Dedicated chest CT is ?recommended for better evaluation. ?3. A 1 cm nonobstructing left renal inferior pole calculus. No ?hydronephrosis. ?4. Aortic Atherosclerosis. ? ?Dr. Constance Haw was contacted and is aware about the patient.  She will take him on her service in AM.  The patient was given 1 mg of IV Dilaudid, 4 mg of IV Zofran, 1 L bolus of IV lactated Ringer and IV Cipro and Flagyl.   The patient will be admitted to a stepdown unit bed for further evaluation and management. ? ?PAST MEDICAL HISTORY:  ?GERD, hypertension and tobacco abuse. ? ?PAST SURGICAL HISTORY:  ? ?Past Surgical History:  ?Procedure Laterality Date  ? HAND TENDON SURGERY    ? ? ?SOCIAL HISTORY:  ? ?Social History  ? ?Tobacco Use  ? Smoking status: Every Day  ?  Packs/day: 1.00  ?  Types: Cigarettes  ? Smokeless tobacco: Never  ?Substance Use Topics  ? Alcohol use: Yes  ?  Alcohol/week: 2.0 standard drinks  ?  Types: 2 Cans of beer per week  ?  Comment: nighlty   ? ? ?FAMILY HISTORY:  ?No family history on file. ? ?DRUG ALLERGIES:  ? ?Allergies  ?Allergen Reactions  ? Penicillins Other (See Comments)  ?  Did it involve swelling of the face/tongue/throat, SOB, or low BP? Unknown ?Did it involve sudden or severe rash/hives, skin peeling, or any reaction on the inside of your mouth or nose? Unknown ?Did you need to seek medical attention at a hospital or doctor's office? Unknown ?When did it last happen?       ?If all above answers are ?NO?, may proceed with cephalosporin use. ?  ? ? ?REVIEW OF SYSTEMS:  ? ?ROS ?As per history of present illness. All pertinent systems were reviewed above. Constitutional, HEENT, cardiovascular, respiratory, GI, GU, musculoskeletal, neuro, psychiatric, endocrine, integumentary and hematologic systems were reviewed and are otherwise negative/unremarkable except  for positive findings mentioned above in the HPI. ? ? ?MEDICATIONS AT HOME:  ? ?Prior to Admission medications   ?Medication Sig Start Date End Date Taking? Authorizing Provider  ?esomeprazole (NEXIUM) 20 MG capsule Take 20 mg by mouth daily at 12 noon.    [provider]  ?ibuprofen (ADVIL,MOTRIN) 200 MG tablet Take 200-400 mg by mouth every 6 (six) hours as needed for mild pain or moderate pain.    [provider]  ? ?  ? ?VITAL SIGNS:  ?Blood pressure (!) 162/102, pulse (!) 119, temperature 98.3 ?F (36.8 ?C), temperature  source Oral, resp. rate 14, height 5\' 6"  (1.676 m), weight 59 kg, SpO2 99 %. ? ?PHYSICAL EXAMINATION:  ?Physical Exam ? ?GENERAL:  51 y.o.-year-old Caucasian male patient lying in the bed with no acute distress.  ?EYES: Pupils equal, round, reactive to light and accommodation. No scleral icterus. Extraocular muscles intact.  ?HEENT: Head atraumatic, normocephalic. Oropharynx and nasopharynx clear.  ?NECK:  Supple, no jugular venous distention. No thyroid enlargement, no tenderness.  ?LUNGS: Normal breath sounds bilaterally, no wheezing, rales,rhonchi or crepitation. No use of accessory muscles of respiration.  ?CARDIOVASCULAR: Regular rate and rhythm, S1, S2 normal. No murmurs, rubs, or gallops.  ?ABDOMEN: Soft, nondistended, with generalized tenderness mainly in the left lower quadrant tenderness.  No rebound tenderness guarding or rigidity.  Bowel sounds present. No organomegaly or mass.  ?EXTREMITIES: No pedal edema, cyanosis, or clubbing.  ?NEUROLOGIC: Cranial nerves II through XII are intact. Muscle strength 5/5 in all extremities. Sensation intact. Gait not checked.  ?PSYCHIATRIC: The patient is alert and oriented x 3.  Normal affect and good eye contact. ?SKIN: No obvious rash, lesion, or ulcer.  ? ?LABORATORY PANEL:  ? ?CBC ?Recent Labs  ?Lab 06/01/21 ?2131  ?WBC 28.6*  ?HGB 18.9*  ?HCT 55.2*  ?PLT 270  ? ?------------------------------------------------------------------------------------------------------------------ ? ?Chemistries  ?Recent Labs  ?Lab 06/01/21 ?2131  ?NA 134*  ?K 3.5  ?CL 99  ?CO2 21*  ?GLUCOSE 119*  ?BUN 11  ?CREATININE 0.78  ?CALCIUM 9.3  ?AST 14*  ?ALT 13  ?ALKPHOS 57  ?BILITOT 1.8*  ? ?------------------------------------------------------------------------------------------------------------------ ? ?Cardiac Enzymes ?No results for input(s): TROPONINI in the last 168  hours. ?------------------------------------------------------------------------------------------------------------------ ? ?RADIOLOGY:  ?CT ABDOMEN PELVIS W CONTRAST ? ?Result Date: 06/02/2021 ?CLINICAL DATA:  Abdominal pain. EXAM: CT ABDOMEN AND PELVIS WITH CONTRAST TECHNIQUE: Multidetector CT imaging of the abdomen and pelvis was performed using the standard protocol following bolus administration of intravenous contrast. RADIATION DOSE REDUCTION: This exam was performed according to the departmental dose-optimization program which includes automated exposure control, adjustment of the mA and/or kV according to patient size and/or use of iterative reconstruction technique. CONTRAST:  157mL OMNIPAQUE IOHEXOL 300 MG/ML  SOLN COMPARISON:  CT dated 05/27/2018. FINDINGS: Lower chest: Indeterminate and partially visualized 2.5 x 1.5 cm left infrahilar nodule abutting the descending thoracic aorta. This is new since the prior CT and partially visualized and not evaluated. Dedicated chest CT is recommended for better evaluation. The visualized lung bases are otherwise clear. No intra-abdominal free air.  No significant free fluid. Hepatobiliary: No focal liver abnormality is seen. No gallstones, gallbladder wall thickening, or biliary dilatation. Pancreas: Unremarkable. No pancreatic ductal dilatation or surrounding inflammatory changes. Spleen: Normal in size without focal abnormality. Adrenals/Urinary Tract: The adrenal glands unremarkable. There is a 1 cm nonobstructing left renal inferior pole calculus. No hydronephrosis. The right kidney is unremarkable. There is symmetric enhancement and excretion of contrast by both kidneys. The visualized  ureters and urinary bladder appear unremarkable. Stomach/Bowel: Several sigmoid diverticula noted. There is inflammatory changes of a segment of the sigmoid colon centered high a sigmoid diverticula within the pelvis (67/2 and coronal 59/5) consistent with acute diverticulitis.  There is perforation of the inflamed sigmoid diverticula with several scattered focally contained pockets of air. No drainable fluid collection or abscess. Mildly thickened and inflamed loops of distal small bowel in the lower abdomen likely reactive to sigmoid diverticulitis. There is no bowel obstruction. The appendix

## 2021-06-02 NOTE — Progress Notes (Signed)
?  Transition of Care (TOC) Screening Note ? ? ?Patient Details  ?Name: Dakota Bryant ?Date of Birth: 1970/11/10 ? ? ?Transition of Care (TOC) CM/SW Contact:    ?Iona Beard, LCSWA ?Phone Number: ?06/02/2021, 10:50 AM ? ? ? ?Transition of Care Department [facility]) has reviewed patient and no TOC needs have been identified at this time. We will continue to monitor patient advancement through interdisciplinary progression rounds. If new patient transition needs arise, please place a TOC consult. ?  ?

## 2021-06-02 NOTE — Assessment & Plan Note (Addendum)
-  This is clearly secondary to perforated diverticulitis. ?-This is manifested by significant leukocytosis and tachycardia as well as tachypnea. ?-Sepsis features resolving appropriately. ?-Continue current IV antibiotics. ?-Continue IV fluids and advance diet as per surgical service recommendations. ?

## 2021-06-02 NOTE — Assessment & Plan Note (Addendum)
-  This is secondary to acute sigmoid diverticulitis. ?-Status post sigmoidectomy with colostomy ?-Postoperative care per general surgery service. ?-Continue IV antibiotic ?-Still no signs of resumption intestinal movements ?-Continue analgesics management and Continue supportive care. ?

## 2021-06-02 NOTE — Assessment & Plan Note (Addendum)
Continue PPI ?

## 2021-06-02 NOTE — Progress Notes (Signed)
Patient able to ambulate to toilet in room to use restroom. Large bloody bowel movement noted. Frank bright red blood noted. Patient with noted anxiety after event. Reassured patient, notified medical team, Dr. Constance Haw and Dr. Roderic Palau.  ?

## 2021-06-03 ENCOUNTER — Inpatient Hospital Stay (HOSPITAL_COMMUNITY): Payer: Self-pay | Admitting: Certified Registered"

## 2021-06-03 ENCOUNTER — Encounter (HOSPITAL_COMMUNITY)
Admission: EM | Disposition: A | Discharge status: Home / Self Care | Payer: Self-pay | Source: Home / Self Care | Attending: General Surgery

## 2021-06-03 ENCOUNTER — Inpatient Hospital Stay (HOSPITAL_COMMUNITY): Payer: Self-pay

## 2021-06-03 ENCOUNTER — Encounter (HOSPITAL_COMMUNITY): Payer: Self-pay | Admitting: Family Medicine

## 2021-06-03 DIAGNOSIS — R188 Other ascites: Secondary | ICD-10-CM

## 2021-06-03 DIAGNOSIS — I10 Essential (primary) hypertension: Secondary | ICD-10-CM

## 2021-06-03 DIAGNOSIS — K5720 Diverticulitis of large intestine with perforation and abscess without bleeding: Secondary | ICD-10-CM

## 2021-06-03 DIAGNOSIS — K5732 Diverticulitis of large intestine without perforation or abscess without bleeding: Secondary | ICD-10-CM

## 2021-06-03 HISTORY — PX: COLOSTOMY: SHX63

## 2021-06-03 LAB — TYPE AND SCREEN
ABO/RH(D): A POS
Antibody Screen: NEGATIVE

## 2021-06-03 LAB — CBC WITH DIFFERENTIAL/PLATELET
Abs Immature Granulocytes: 0.24 10*3/uL — ABNORMAL HIGH (ref 0.00–0.07)
Basophils Absolute: 0.1 10*3/uL (ref 0.0–0.1)
Basophils Relative: 0 %
Eosinophils Absolute: 0 10*3/uL (ref 0.0–0.5)
Eosinophils Relative: 0 %
HCT: 48 % (ref 39.0–52.0)
Hemoglobin: 16 g/dL (ref 13.0–17.0)
Immature Granulocytes: 1 %
Lymphocytes Relative: 4 %
Lymphs Abs: 0.9 10*3/uL (ref 0.7–4.0)
MCH: 32.7 pg (ref 26.0–34.0)
MCHC: 33.3 g/dL (ref 30.0–36.0)
MCV: 98.2 fL (ref 80.0–100.0)
Monocytes Absolute: 0.8 10*3/uL (ref 0.1–1.0)
Monocytes Relative: 4 %
Neutro Abs: 18.4 10*3/uL — ABNORMAL HIGH (ref 1.7–7.7)
Neutrophils Relative %: 91 %
Platelets: 225 10*3/uL (ref 150–400)
RBC: 4.89 MIL/uL (ref 4.22–5.81)
RDW: 12.7 % (ref 11.5–15.5)
WBC: 20.5 10*3/uL — ABNORMAL HIGH (ref 4.0–10.5)
nRBC: 0 % (ref 0.0–0.2)

## 2021-06-03 LAB — BASIC METABOLIC PANEL
Anion gap: 9 (ref 5–15)
BUN: 9 mg/dL (ref 6–20)
CO2: 22 mmol/L (ref 22–32)
Calcium: 8.5 mg/dL — ABNORMAL LOW (ref 8.9–10.3)
Chloride: 103 mmol/L (ref 98–111)
Creatinine, Ser: 0.68 mg/dL (ref 0.61–1.24)
GFR, Estimated: >60 mL/min (ref >60)
Glucose, Bld: 105 mg/dL — ABNORMAL HIGH (ref 70–99)
Potassium: 4.3 mmol/L (ref 3.5–5.1)
Sodium: 134 mmol/L — ABNORMAL LOW (ref 135–145)

## 2021-06-03 LAB — PHOSPHORUS: Phosphorus: 2.4 mg/dL — ABNORMAL LOW (ref 2.5–4.6)

## 2021-06-03 LAB — GLUCOSE, CAPILLARY: Glucose-Capillary: 129 mg/dL — ABNORMAL HIGH (ref 70–99)

## 2021-06-03 LAB — MAGNESIUM: Magnesium: 1.8 mg/dL (ref 1.7–2.4)

## 2021-06-03 SURGERY — CREATION, COLOSTOMY
Anesthesia: General | Site: Abdomen

## 2021-06-03 MED ORDER — DEXMEDETOMIDINE (PRECEDEX) IN NS 20 MCG/5ML (4 MCG/ML) IV SYRINGE
PREFILLED_SYRINGE | INTRAVENOUS | Status: AC
  Filled 2021-06-03: qty 5

## 2021-06-03 MED ORDER — PROPOFOL 10 MG/ML IV BOLUS
INTRAVENOUS | Status: AC
  Filled 2021-06-03: qty 20

## 2021-06-03 MED ORDER — SUCCINYLCHOLINE CHLORIDE 200 MG/10ML IV SOSY
PREFILLED_SYRINGE | INTRAVENOUS | Status: AC
  Filled 2021-06-03: qty 10

## 2021-06-03 MED ORDER — CHLORHEXIDINE GLUCONATE CLOTH 2 % EX PADS
6.0000 | MEDICATED_PAD | Freq: Once | CUTANEOUS | Status: AC
  Administered 2021-06-03: 6 via TOPICAL

## 2021-06-03 MED ORDER — LACTATED RINGERS IV SOLN
INTRAVENOUS | Status: DC
  Administered 2021-06-03: 1000 mL via INTRAVENOUS

## 2021-06-03 MED ORDER — CHLORHEXIDINE GLUCONATE 0.12 % MT SOLN
15.0000 mL | Freq: Once | OROMUCOSAL | Status: AC
  Administered 2021-06-03: 15 mL via OROMUCOSAL

## 2021-06-03 MED ORDER — HYDROMORPHONE HCL 1 MG/ML IJ SOLN
INTRAMUSCULAR | Status: AC
  Filled 2021-06-03: qty 0.5

## 2021-06-03 MED ORDER — HYDROMORPHONE HCL 1 MG/ML IJ SOLN
2.0000 mg | INTRAMUSCULAR | Status: DC | PRN
  Administered 2021-06-03 – 2021-06-09 (×24): 2 mg via INTRAVENOUS
  Filled 2021-06-03 (×25): qty 2

## 2021-06-03 MED ORDER — ESMOLOL HCL 100 MG/10ML IV SOLN
INTRAVENOUS | Status: AC
  Filled 2021-06-03: qty 10

## 2021-06-03 MED ORDER — SUGAMMADEX SODIUM 200 MG/2ML IV SOLN
INTRAVENOUS | Status: DC | PRN
Start: 2021-06-03 — End: 2021-06-03
  Administered 2021-06-03: 200 mg via INTRAVENOUS

## 2021-06-03 MED ORDER — ROCURONIUM BROMIDE 10 MG/ML (PF) SYRINGE
PREFILLED_SYRINGE | INTRAVENOUS | Status: DC | PRN
Start: 2021-06-03 — End: 2021-06-03
  Administered 2021-06-03: 40 mg via INTRAVENOUS

## 2021-06-03 MED ORDER — ESMOLOL HCL 100 MG/10ML IV SOLN
INTRAVENOUS | Status: DC | PRN
  Administered 2021-06-03: 20 mg via INTRAVENOUS
  Administered 2021-06-03: 30 mg via INTRAVENOUS
  Administered 2021-06-03: 20 mg via INTRAVENOUS
  Administered 2021-06-03: 30 mg via INTRAVENOUS

## 2021-06-03 MED ORDER — PROPOFOL 10 MG/ML IV BOLUS
INTRAVENOUS | Status: DC | PRN
  Administered 2021-06-03: 200 mg via INTRAVENOUS

## 2021-06-03 MED ORDER — LABETALOL HCL 5 MG/ML IV SOLN
10.0000 mg | Freq: Once | INTRAVENOUS | Status: AC
  Administered 2021-06-03: 10 mg via INTRAVENOUS
  Filled 2021-06-03: qty 4

## 2021-06-03 MED ORDER — KETAMINE HCL 50 MG/5ML IJ SOSY
PREFILLED_SYRINGE | INTRAMUSCULAR | Status: AC
  Filled 2021-06-03: qty 5

## 2021-06-03 MED ORDER — KETAMINE HCL 10 MG/ML IJ SOLN
INTRAMUSCULAR | Status: DC | PRN
  Administered 2021-06-03 (×2): 25 mg via INTRAVENOUS

## 2021-06-03 MED ORDER — DEXAMETHASONE SODIUM PHOSPHATE 10 MG/ML IJ SOLN
INTRAMUSCULAR | Status: AC
  Filled 2021-06-03: qty 1

## 2021-06-03 MED ORDER — LIDOCAINE HCL (PF) 2 % IJ SOLN
INTRAMUSCULAR | Status: AC
  Filled 2021-06-03: qty 5

## 2021-06-03 MED ORDER — SUCCINYLCHOLINE CHLORIDE 200 MG/10ML IV SOSY
PREFILLED_SYRINGE | INTRAVENOUS | Status: DC | PRN
  Administered 2021-06-03: 100 mg via INTRAVENOUS

## 2021-06-03 MED ORDER — DEXMEDETOMIDINE (PRECEDEX) IN NS 20 MCG/5ML (4 MCG/ML) IV SYRINGE
PREFILLED_SYRINGE | INTRAVENOUS | Status: DC | PRN
  Administered 2021-06-03: 20 ug via INTRAVENOUS

## 2021-06-03 MED ORDER — ORAL CARE MOUTH RINSE: 15.0000 mL | Freq: Once | OROMUCOSAL | Status: AC

## 2021-06-03 MED ORDER — LACTATED RINGERS IV SOLN: INTRAVENOUS | Status: DC | PRN

## 2021-06-03 MED ORDER — ONDANSETRON HCL 4 MG/2ML IJ SOLN
INTRAMUSCULAR | Status: AC
  Filled 2021-06-03: qty 2

## 2021-06-03 MED ORDER — FENTANYL CITRATE PF 50 MCG/ML IJ SOSY
25.0000 ug | PREFILLED_SYRINGE | INTRAMUSCULAR | Status: DC | PRN
  Administered 2021-06-03 (×2): 50 ug via INTRAVENOUS
  Filled 2021-06-03 (×2): qty 1

## 2021-06-03 MED ORDER — SODIUM CHLORIDE 0.9 % IR SOLN
Status: DC | PRN
  Administered 2021-06-03: 5000 mL

## 2021-06-03 MED ORDER — KETOROLAC TROMETHAMINE 30 MG/ML IJ SOLN
30.0000 mg | Freq: Four times a day (QID) | INTRAMUSCULAR | Status: AC
  Administered 2021-06-03 – 2021-06-08 (×20): 30 mg via INTRAVENOUS
  Filled 2021-06-03 (×20): qty 1

## 2021-06-03 MED ORDER — ROCURONIUM BROMIDE 10 MG/ML (PF) SYRINGE
PREFILLED_SYRINGE | INTRAVENOUS | Status: AC
  Filled 2021-06-03: qty 10

## 2021-06-03 MED ORDER — IOHEXOL 300 MG/ML  SOLN
75.0000 mL | Freq: Once | INTRAMUSCULAR | Status: AC | PRN
  Administered 2021-06-03: 75 mL via INTRAVENOUS

## 2021-06-03 MED ORDER — FENTANYL CITRATE (PF) 100 MCG/2ML IJ SOLN
INTRAMUSCULAR | Status: AC
  Filled 2021-06-03: qty 2

## 2021-06-03 MED ORDER — VASOPRESSIN 20 UNIT/ML IV SOLN
INTRAVENOUS | Status: AC
  Filled 2021-06-03: qty 1

## 2021-06-03 MED ORDER — ONDANSETRON HCL 4 MG/2ML IJ SOLN: 4.0000 mg | Freq: Once | INTRAMUSCULAR | Status: DC | PRN

## 2021-06-03 MED ORDER — ONDANSETRON HCL 4 MG/2ML IJ SOLN
INTRAMUSCULAR | Status: DC | PRN
  Administered 2021-06-03: 4 mg via INTRAVENOUS

## 2021-06-03 MED ORDER — HYDROMORPHONE HCL 1 MG/ML IJ SOLN
1.0000 mg | INTRAMUSCULAR | Status: DC | PRN
  Administered 2021-06-03 (×2): 1 mg via INTRAVENOUS
  Filled 2021-06-03: qty 1

## 2021-06-03 MED ORDER — DEXAMETHASONE SODIUM PHOSPHATE 4 MG/ML IJ SOLN
INTRAMUSCULAR | Status: DC | PRN
  Administered 2021-06-03: 5 mg via INTRAVENOUS

## 2021-06-03 MED ORDER — ACETAMINOPHEN 500 MG PO TABS
1000.0000 mg | ORAL_TABLET | Freq: Four times a day (QID) | ORAL | Status: DC
  Administered 2021-06-03 – 2021-06-04 (×3): 1000 mg via ORAL
  Filled 2021-06-03 (×3): qty 2

## 2021-06-03 MED ORDER — PHENYLEPHRINE 40 MCG/ML (10ML) SYRINGE FOR IV PUSH (FOR BLOOD PRESSURE SUPPORT)
PREFILLED_SYRINGE | INTRAVENOUS | Status: AC
  Filled 2021-06-03: qty 10

## 2021-06-03 MED ORDER — LIDOCAINE 2% (20 MG/ML) 5 ML SYRINGE
INTRAMUSCULAR | Status: DC | PRN
  Administered 2021-06-03: 100 mg via INTRAVENOUS

## 2021-06-03 MED ORDER — FENTANYL CITRATE (PF) 250 MCG/5ML IJ SOLN
INTRAMUSCULAR | Status: DC | PRN
  Administered 2021-06-03 (×4): 50 ug via INTRAVENOUS
  Administered 2021-06-03: 100 ug via INTRAVENOUS

## 2021-06-03 SURGICAL SUPPLY — 35 items
BENZOIN TINCTURE PRP APPL 2/3 (GAUZE/BANDAGES/DRESSINGS) ×2 IMPLANT
CHLORAPREP W/TINT 26 (MISCELLANEOUS) ×2 IMPLANT
CLOTH BEACON ORANGE TIMEOUT ST (SAFETY) ×2 IMPLANT
COVER LIGHT HANDLE STERIS (MISCELLANEOUS) ×4 IMPLANT
DRSG OPSITE POSTOP 4X8 (GAUZE/BANDAGES/DRESSINGS) ×1 IMPLANT
ELECT BLADE 6 FLAT ULTRCLN (ELECTRODE) ×2 IMPLANT
ELECT REM PT RETURN 9FT ADLT (ELECTROSURGICAL) ×2
ELECTRODE REM PT RTRN 9FT ADLT (ELECTROSURGICAL) ×1 IMPLANT
GAUZE SPONGE 4X4 12PLY STRL (GAUZE/BANDAGES/DRESSINGS) ×1 IMPLANT
GLOVE SURG ENC MOIS LTX SZ6.5 (GLOVE) ×4 IMPLANT
GLOVE SURG UNDER POLY LF SZ7 (GLOVE) ×4 IMPLANT
GOWN STRL REUS W/TWL LRG LVL3 (GOWN DISPOSABLE) ×6 IMPLANT
HANDLE SUCTION POOLE (INSTRUMENTS) IMPLANT
INST SET MAJOR GENERAL (KITS) ×2 IMPLANT
KIT TURNOVER KIT A (KITS) ×2 IMPLANT
LIGASURE IMPACT 36 18CM CVD LR (INSTRUMENTS) ×2 IMPLANT
MANIFOLD NEPTUNE II (INSTRUMENTS) ×2 IMPLANT
NS IRRIG 1000ML POUR BTL (IV SOLUTION) ×7 IMPLANT
PACK MAJOR ABDOMINAL (CUSTOM PROCEDURE TRAY) ×2 IMPLANT
PAD ABD 8X10 STRL (GAUZE/BANDAGES/DRESSINGS) ×1 IMPLANT
PAD ARMBOARD 7.5X6 YLW CONV (MISCELLANEOUS) ×2 IMPLANT
RETRACTOR WOUND ALXS 18CM MED (MISCELLANEOUS) IMPLANT
RTRCTR WOUND ALEXIS O 18CM MED (MISCELLANEOUS) ×2
SPONGE T-LAP 18X18 ~~LOC~~+RFID (SPONGE) ×3 IMPLANT
STAPLER CVD CUT GN 40 RELOAD (ENDOMECHANICALS) ×2 IMPLANT
STAPLER CVD CUT GRN 40 RELOAD (ENDOMECHANICALS) IMPLANT
STAPLER PROXIMATE 75MM BLUE (STAPLE) ×1 IMPLANT
STAPLER VISISTAT (STAPLE) ×2 IMPLANT
SUCTION POOLE HANDLE (INSTRUMENTS) ×2
SUT CHROMIC 3 0 PS 2 (SUTURE) ×7 IMPLANT
SUT PDS AB CT VIOLET #0 27IN (SUTURE) ×4 IMPLANT
SUT SILK 3 0 SH CR/8 (SUTURE) IMPLANT
SWAB CULTURE ESWAB REG 1ML (MISCELLANEOUS) ×1 IMPLANT
TAPE CLOTH SURG 4X10 WHT LF (GAUZE/BANDAGES/DRESSINGS) ×1 IMPLANT
TRAY FOLEY MTR SLVR 16FR STAT (SET/KITS/TRAYS/PACK) ×2 IMPLANT

## 2021-06-03 NOTE — Progress Notes (Signed)
Nurse notified MD about pt's increase abdominal pain despite receiving IV pain medication and feeling bloated and tight; orders obtained for a repeat CT scan.  ?

## 2021-06-03 NOTE — Transfer of Care (Signed)
Immediate Anesthesia Transfer of Care Note ? ?Patient: Dakota Bryant ? ?Procedure(s) Performed: SIGMOID COLECTOMY WITH COLOSTOMY (Abdomen) ? ?Patient Location: PACU ? ?Anesthesia Type:General ? ?Level of Consciousness: drowsy ? ?Airway & Oxygen Therapy: Patient Spontanous Breathing and Patient connected to face mask oxygen ? ?Post-op Assessment: Report given to RN and Post -op Vital signs reviewed and stable ? ?Post vital signs: Reviewed and stable ? ?Last Vitals:  ?Vitals Value Taken Time  ?BP 177/109 06/03/21 1544  ?Temp    ?Pulse 96 06/03/21 1544  ?Resp 17 06/03/21 1544  ?SpO2 100 % 06/03/21 1544  ?Vitals shown include unvalidated device data. ? ?Last Pain:  ?Vitals:  ? 06/03/21 1353  ?TempSrc: Oral  ?PainSc: 8   ?   ? ?Patients Stated Pain Goal: 6 (06/03/21 1353) ? ?Complications: No notable events documented. ?

## 2021-06-03 NOTE — Progress Notes (Signed)
[facility] ? ?Pain post op. Increased dilaudid. Tylenol 1000mg  q 6 scheduled. ? ?Hold NG suction with oral meds. ? ?Curlene Labrum, MD ?[facility] ?[place], [place] [address] ?[phone] (office) ? ?

## 2021-06-03 NOTE — Anesthesia Procedure Notes (Addendum)
Procedure Name: Intubation ?Date/Time: 06/03/2021 2:09 PM ?Performed by: Orlie Dakin, CRNA ?Pre-anesthesia Checklist: Patient identified, Emergency Drugs available, Suction available and Patient being monitored ?Patient Re-evaluated:Patient Re-evaluated prior to induction ?Oxygen Delivery Method: Circle system utilized ?Preoxygenation: Pre-oxygenation with 100% oxygen ?Induction Type: IV induction, Rapid sequence and Cricoid Pressure applied ?Laryngoscope Size: Glidescope and 3 ?Grade View: Grade I ?Tube type: Oral ?Tube size: 7.5 mm ?Number of attempts: 1 ?Airway Equipment and Method: Rigid stylet and Video-laryngoscopy ?Placement Confirmation: ETT inserted through vocal cords under direct vision, positive ETCO2 and breath sounds checked- equal and bilateral ?Secured at: 23 cm ?Tube secured with: Tape ?Dental Injury: Teeth and Oropharynx as per pre-operative assessment  ?Comments: 4x4s bite block used. ? ? ? ? ?

## 2021-06-03 NOTE — Anesthesia Preprocedure Evaluation (Signed)
Anesthesia Evaluation  ?Patient identified by MRN, date of birth, ID band ?Patient awake ? ? ? ?Reviewed: ?Allergy & Precautions, H&P , NPO status , Patient's Chart, lab work & pertinent test results, reviewed documented beta blocker date and time  ? ?Airway ?Mallampati: II ? ?TM Distance: >3 FB ?Neck ROM: full ? ? ? Dental ?no notable dental hx. ? ?  ?Pulmonary ?neg pulmonary ROS, Current Smoker,  ?  ?Pulmonary exam normal ?breath sounds clear to auscultation ? ? ? ? ? ? Cardiovascular ?Exercise Tolerance: Good ?hypertension,  ?Rhythm:regular Rate:Normal ? ? ?  ?Neuro/Psych ?negative neurological ROS ? negative psych ROS  ? GI/Hepatic ?Neg liver ROS, GERD  Medicated,  ?Endo/Other  ?negative endocrine ROS ? Renal/GU ?negative Renal ROS  ?negative genitourinary ?  ?Musculoskeletal ? ? Abdominal ?  ?Peds ? Hematology ?negative hematology ROS ?(+)   ?Anesthesia Other Findings ? ? Reproductive/Obstetrics ?negative OB ROS ? ?  ? ? ? ? ? ? ? ? ? ? ? ? ? ?  ?  ? ? ? ? ? ? ? ? ?Anesthesia Physical ?Anesthesia Plan ? ?ASA: 4 and emergent ? ?Anesthesia Plan: General and General ETT  ? ?Post-op Pain Management:   ? ?Induction:  ? ?PONV Risk Score and Plan: Ondansetron ? ?Airway Management Planned:  ? ?Additional Equipment:  ? ?Intra-op Plan:  ? ?Post-operative Plan:  ? ?Informed Consent: I have reviewed the patients History and Physical, chart, labs and discussed the procedure including the risks, benefits and alternatives for the proposed anesthesia with the patient or authorized representative who has indicated his/her understanding and acceptance.  ? ? ? ?Dental Advisory Given ? ?Plan Discussed with: CRNA ? ?Anesthesia Plan Comments:   ? ? ? ? ? ? ?Anesthesia Quick Evaluation ? ?

## 2021-06-03 NOTE — Op Note (Signed)
[facility] ?Operative Note ? ?06/03/21 ? ?Preoperative Diagnosis: Perforated diverticulitis  ?  ?Postoperative Diagnosis: Perforated sigmoid diverticulitis with purulent ascites  ?  ?Procedure(s) Performed: Sigmoid colectomy, end colostomy  ?  ?Surgeon: Lanell Matar. Constance Haw, MD ?  ?Assistants: No qualified resident was available  ?  ?Anesthesia: General endotracheal ?  ?Anesthesiologist: Louann Sjogren, MD  ?  ?Specimens: Sigmoid colon, suture proximal; cultures  ?  ?Estimated Blood Loss: Minimal ?  ?Blood Replacement: None  ?  ?Complications: None  ? ?Wound Class: Dirty, infected  ?  ?Operative Indications: Mr. Kloster is a 51 yo with CT findings concerning for perforated diverticulitis with no abscess or fluid collection on presentation abut worsening abdominal pain and exam after 24 hours. Repeat CT demonstrates worsening fluid and air in the pelvis that is not organized. Given these findings we discussed colostomy placement, risk of bleeding, infection, ureter injury, or other organs, and need for the colostomy for at least 3 months and possibly finding something other than diverticulitis.  ? ?Findings: Very edematous and thickened colon and epiploic fat with perforation and purulent ascites  ?  ?Procedure: The patient was taken to the operating room and placed supine. General endotracheal anesthesia was induced. Intravenous antibiotics were administered per protocol.  A nasogastric tube positioned to decompress the stomach. The abdomen was prepared and draped in the usual sterile fashion.  ? ?A lower midline incision was made and carried down through the fascia. Care was used to enter the abdominal cavity. Upon entering the abdomen (organ space), I encountered purulent ascites. This was cultured.  ? ?The small bowel was tucked into the right upper quadrant. The sigmoid colon was palpated and was thickened and inflamed. The epiploic fat was also very inflamed. There was signs of a perforation  with some purulent ascites in the pelvis and localized peritonitis in the pelvis.  ? ?The sigmoid colon was freed up with cautery along the pelvic brim and up the descending colon white line. The mesentery going into the pelvis on the right side was scored. Staying high out of the retroperitoneal cavity, the point of proximal dissection was determined and taken with a linear cutting 75 mm stapler. The point of distal transection was freed up and palpated. Trying to get as low beyond the inflammation and the epiploic fat inflammation as possible, and this was taken with a contour 60 TA stapler.  The mesentery was taken high with a ligasure.  Hemostasis was achieved. The abdomen was copiously irrigated and the laparotomy pads  were removed. The proximal colon was mobilized and the mesentery scored to allow for a tension free placement in the left abdomen.  ? ?The small bowel was replaced into the abdomen. The NG was verified. A circular incision was made left of the umbilicus and carried down in the rectus muscle and a cruciate incision was made. The muscle was separated and the posterior fascia was opened. Two fingers were placed through the defect and the end colostomy was brought out without twisting or tension.  ? ?The midline was closed in the standard fashion with 0 PDS suture. The skin was left open for packing with gauze but a few staples were placed at the superior, middle and inferior aspects to allow for a smaller area to be packed. This was covered up.  ? ?The colostomy was opened and matured with 2-0 chromic gut. The ostomy was digitized and was patent. A ostomy appliance was placed. The wound was packed and ABD  and paper tape was placed.  ? ?Final inspection revealed acceptable hemostasis. All counts were correct at the end of the case. The patient was awakened from anesthesia and extubated without complication.  The patient went to the PACU in stable condition. ?  ?Curlene Labrum, MD ?[facility] ?[place], [place] [address] ?[phone] (office) ?  ?

## 2021-06-03 NOTE — Progress Notes (Signed)
[facility] ? ?Updated his family that Tammy that the surgery was completed. Colostomy done. NPO NG. Foley overnight. Continue antibiotics. ? ?Curlene Labrum, MD ?[facility] ?[place], [place] [address] ?[phone] (office) ?  ?

## 2021-06-03 NOTE — Interval H&P Note (Signed)
History and Physical Interval Note: ? ?06/03/2021 ?1:39 PM ? ?Rodel R Funaro  has presented today for surgery, with the diagnosis of diverticulitis with perforation and abscess.  The various methods of treatment have been discussed with the patient and family. After consideration of risks, benefits and other options for treatment, the patient has consented to  Procedure(s): ?COLOSTOMY (N/A) as a surgical intervention.  The patient's history has been reviewed, patient examined, no change in status, stable for surgery.  I have reviewed the patient's chart and labs.  Questions were answered to the patient's satisfaction.   ? ?Worsening pain. CT with larger collection with fluid and air. Has not organized. Plan for OR for colectomy, colostomy.  ? ?Virl Cagey ? ? ?

## 2021-06-03 NOTE — Anesthesia Postprocedure Evaluation (Signed)
Anesthesia Post Note ? ?Patient: Finnbar Cedillos Medaglia ? ?Procedure(s) Performed: SIGMOID COLECTOMY WITH COLOSTOMY (Abdomen) ? ?Patient location during evaluation: Phase II ?Anesthesia Type: General ?Level of consciousness: awake ?Pain management: pain level controlled ?Vital Signs Assessment: post-procedure vital signs reviewed and stable ?Respiratory status: spontaneous breathing and respiratory function stable ?Cardiovascular status: blood pressure returned to baseline and stable ?Postop Assessment: no headache and no apparent nausea or vomiting ?Anesthetic complications: no ?Comments: Late entry ? ? ?No notable events documented. ? ? ?Last Vitals:  ?Vitals:  ? 06/03/21 1544 06/03/21 1600  ?BP: (!) 177/109 (!) 178/128  ?Pulse: 96 (!) 110  ?Resp: 19 20  ?Temp: 37.2 ?C   ?SpO2: 100% 99%  ?  ?Last Pain:  ?Vitals:  ? 06/03/21 1600  ?TempSrc:   ?PainSc: 10-Worst pain ever  ? ? ?  ?  ?  ?  ?  ?  ? ?Louann Sjogren ? ? ? ? ?

## 2021-06-04 LAB — CBC WITH DIFFERENTIAL/PLATELET
Abs Immature Granulocytes: 0.1 10*3/uL — ABNORMAL HIGH (ref 0.00–0.07)
Basophils Absolute: 0 10*3/uL (ref 0.0–0.1)
Basophils Relative: 0 %
Eosinophils Absolute: 0 10*3/uL (ref 0.0–0.5)
Eosinophils Relative: 0 %
HCT: 46.1 % (ref 39.0–52.0)
Hemoglobin: 15.2 g/dL (ref 13.0–17.0)
Immature Granulocytes: 1 %
Lymphocytes Relative: 4 %
Lymphs Abs: 0.7 10*3/uL (ref 0.7–4.0)
MCH: 33 pg (ref 26.0–34.0)
MCHC: 33 g/dL (ref 30.0–36.0)
MCV: 100 fL (ref 80.0–100.0)
Monocytes Absolute: 0.8 10*3/uL (ref 0.1–1.0)
Monocytes Relative: 5 %
Neutro Abs: 15 10*3/uL — ABNORMAL HIGH (ref 1.7–7.7)
Neutrophils Relative %: 90 %
Platelets: 218 10*3/uL (ref 150–400)
RBC: 4.61 MIL/uL (ref 4.22–5.81)
RDW: 12.9 % (ref 11.5–15.5)
WBC: 16.6 10*3/uL — ABNORMAL HIGH (ref 4.0–10.5)
nRBC: 0 % (ref 0.0–0.2)

## 2021-06-04 LAB — BASIC METABOLIC PANEL
Anion gap: 8 (ref 5–15)
BUN: 9 mg/dL (ref 6–20)
CO2: 22 mmol/L (ref 22–32)
Calcium: 7.8 mg/dL — ABNORMAL LOW (ref 8.9–10.3)
Chloride: 104 mmol/L (ref 98–111)
Creatinine, Ser: 0.62 mg/dL (ref 0.61–1.24)
GFR, Estimated: >60 mL/min (ref >60)
Glucose, Bld: 131 mg/dL — ABNORMAL HIGH (ref 70–99)
Potassium: 3.9 mmol/L (ref 3.5–5.1)
Sodium: 134 mmol/L — ABNORMAL LOW (ref 135–145)

## 2021-06-04 LAB — PHOSPHORUS: Phosphorus: 2.6 mg/dL (ref 2.5–4.6)

## 2021-06-04 LAB — MAGNESIUM: Magnesium: 1.8 mg/dL (ref 1.7–2.4)

## 2021-06-04 MED ORDER — MAGNESIUM SULFATE 2 GM/50ML IV SOLN
2.0000 g | Freq: Once | INTRAVENOUS | Status: AC
  Administered 2021-06-04: 2 g via INTRAVENOUS
  Filled 2021-06-04: qty 50

## 2021-06-04 MED ORDER — ENOXAPARIN SODIUM 40 MG/0.4ML IJ SOSY
40.0000 mg | PREFILLED_SYRINGE | INTRAMUSCULAR | Status: DC
  Administered 2021-06-04 – 2021-06-08 (×5): 40 mg via SUBCUTANEOUS
  Filled 2021-06-04 (×5): qty 0.4

## 2021-06-04 MED ORDER — SODIUM PHOSPHATES 45 MMOLE/15ML IV SOLN
30.0000 mmol | Freq: Once | INTRAVENOUS | Status: AC
  Administered 2021-06-04: 30 mmol via INTRAVENOUS
  Filled 2021-06-04: qty 10

## 2021-06-04 MED ORDER — METOPROLOL TARTRATE 5 MG/5ML IV SOLN
5.0000 mg | Freq: Four times a day (QID) | INTRAVENOUS | Status: AC | PRN
  Administered 2021-06-04 – 2021-06-05 (×4): 5 mg via INTRAVENOUS
  Filled 2021-06-04 (×4): qty 5

## 2021-06-04 MED ORDER — ACETAMINOPHEN 160 MG/5ML PO SOLN
1000.0000 mg | Freq: Four times a day (QID) | ORAL | Status: AC
  Administered 2021-06-04 – 2021-06-08 (×15): 1000 mg via ORAL
  Filled 2021-06-04 (×14): qty 40.6

## 2021-06-04 NOTE — Progress Notes (Signed)
[facility] Progress Note ? ?1 Day Post-Op  ?Subjective: ?Pain improving, had to swallow the tylenol pills with NG, minor gas today in bag earlier.  ? ?Objective: ?Vital signs in last 24 hours: ?Temp:  [97.9 ?F (36.6 ?C)-99.3 ?F (37.4 ?C)] 98.2 ?F (36.8 ?C) (03/18 1100) ?Pulse Rate:  [83-110] 85 (03/18 1330) ?Resp:  [8-23] 11 (03/18 1330) ?BP: (149-188)/(91-128) 163/105 (03/18 1330) ?SpO2:  [90 %-100 %] 96 % (03/18 1330) ?Weight:  [58.5 kg-65.6 kg] 65.6 kg (03/18 0630) ?Last BM Date : 06/02/21 ? ?Intake/Output from previous day: ?03/17 0701 - 03/18 0700 ?In: 4723.5 [I.V.:4123.5; IV SAYTKZSWF:093] ?Out: 1425 [Urine:1325; Blood:100] ?Intake/Output this shift: ?Total I/O ?In: -  ?Out: 250 [Emesis/NG output:250] ? ?General appearance: alert and no distress ?Resp: normal work of breathing ?GI: soft, nondistended, ostomy with edema and some bowel sweat in the bag  ? ?Lab Results:  ?Recent Labs  ?  06/03/21 ?0431 06/04/21 ?0411  ?WBC 20.5* 16.6*  ?HGB 16.0 15.2  ?HCT 48.0 46.1  ?PLT 225 218  ? ?BMET ?Recent Labs  ?  06/03/21 ?0431 06/04/21 ?0411  ?NA 134* 134*  ?K 4.3 3.9  ?CL 103 104  ?CO2 22 22  ?GLUCOSE 105* 131*  ?BUN 9 9  ?CREATININE 0.68 0.62  ?CALCIUM 8.5* 7.8*  ? ?PT/INR ?Recent Labs  ?  06/02/21 ?0439  ?LABPROT 15.4*  ?INR 1.2  ? ? ?Studies/Results: ?CT ABDOMEN PELVIS W CONTRAST ? ?Addendum Date: 06/03/2021   ?ADDENDUM REPORT: 06/03/2021 14:36 ADDENDUM: Omitted from impression is presence of a 2.4 x 1.5 cm diameter LEFT infrahilar mass, question adenopathy versus mass; dedicated CT chest with contrast recommended for further evaluation. Electronically Signed   By: Lavonia Dana M.D.   On: 06/03/2021 14:36  ? ?Result Date: 06/03/2021 ?CLINICAL DATA:  Complicated diverticulitis, watery dark stools, chills, abdominal and lower back pain, follow-up EXAM: CT ABDOMEN AND PELVIS WITH CONTRAST TECHNIQUE: Multidetector CT imaging of the abdomen and pelvis was performed using the standard protocol following  bolus administration of intravenous contrast. RADIATION DOSE REDUCTION: This exam was performed according to the departmental dose-optimization program which includes automated exposure control, adjustment of the mA and/or kV according to patient size and/or use of iterative reconstruction technique. CONTRAST:  23mL OMNIPAQUE IOHEXOL 300 MG/ML SOLN IV. No oral contrast. COMPARISON:  06/02/2021 FINDINGS: Lower chest: Bibasilar atelectasis. LEFT infrahilar adenopathy versus mass 2.4 x 1.5 cm unchanged. Hepatobiliary: Vicarious excretion of contrast material within gallbladder. Liver unremarkable. No biliary dilatation. Pancreas: Normal appearance Spleen: Normal appearance Adrenals/Urinary Tract: Adrenal glands, kidneys, ureters, and bladder normal appearance Stomach/Bowel: Appendix not visualized. Stomach unremarkable. Few air-filled dilated small bowel loops, favor ileus. Diffuse wall thickening of the sigmoid colon and rectum over long distance, could reflect colitis or reactive changes secondary to adjacent inflammatory process. Few colonic diverticula noted. New air and fluid collection within the sigmoid mesocolon, 4.3 x 2.9 x 2.8 cm consistent with developing abscess, not yet well-marginated. This collection and the pericolic inflammatory changes have increased since the previous study. Associated retroperitoneal edema and presacral. No definite additional discrete abscess collection. Remaining bowel loops unremarkable. Vascular/Lymphatic: Vascular structures patent. Atherosclerotic calcifications aorta and iliac arteries without aneurysm. Reproductive: Asymmetric enhancement in the peripheral zone of the LEFT prostate lobe, not seen on the study from 1 day ago. This could represent trapped contrast from the preceding exam within the interstices in the prostate gland but prostate cancer in the peripheral zone can be associated with abnormal enhancement. Prostate gland appears symmetric in  size with a few  calcifications and otherwise is slightly inhomogeneous. Other: No free air or free fluid. Tiny umbilical hernia containing fat. Musculoskeletal: No acute osseous findings. Diffusely bulging disc L4-L5 indenting thecal sac and mildly narrowing the AP diameter of the spinal canal. IMPRESSION: New air and fluid collection within the sigmoid mesocolon 4.3 x 2.9 x 2.8 cm consistent with developing abscess, not yet well-marginated. Associated retroperitoneal edema and presacral edema. Diffuse wall thickening of the sigmoid colon and rectum over long distance, could reflect colitis or reactive changes secondary to adjacent inflammatory process/diverticulitis. Asymmetric enhancement in the peripheral zone of the LEFT prostate lobe, not seen on the study from 1 day ago, could represent trapped contrast from prior CT within the interstices of the prostate gland but prostate cancer in the peripheral zone can be associated with abnormal enhancement; recommend urology consultation, digital rectal exam evaluation and PSA assessment with additional imaging as clinically indicated. Aortic Atherosclerosis (ICD10-I70.0). Electronically Signed: By: Lavonia Dana M.D. On: 06/03/2021 14:31   ? ?Anti-infectives: ?Anti-infectives (From admission, onward)  ? ? Start     Dose/Rate Route Frequency Ordered Stop  ? 06/02/21 1400  ciprofloxacin (CIPRO) IVPB 400 mg       ? 400 mg ?200 mL/hr over 60 Minutes Intravenous Every 12 hours 06/02/21 0137    ? 06/02/21 1400  metroNIDAZOLE (FLAGYL) IVPB 500 mg       ? 500 mg ?100 mL/hr over 60 Minutes Intravenous Every 12 hours 06/02/21 0138 06/09/21 1359  ? 06/02/21 0600  aztreonam (AZACTAM) 2 g in sodium chloride 0.9 % 100 mL IVPB  Status:  Discontinued       ? 2 g ?200 mL/hr over 30 Minutes Intravenous Every 8 hours 06/02/21 0310 06/02/21 1005  ? 06/02/21 0145  aztreonam (AZACTAM) 2 g in sodium chloride 0.9 % 100 mL IVPB  Status:  Discontinued       ? 2 g ?200 mL/hr over 30 Minutes Intravenous  Once  06/02/21 0137 06/02/21 0137  ? 06/02/21 0145  metroNIDAZOLE (FLAGYL) IVPB 500 mg  Status:  Discontinued       ? 500 mg ?100 mL/hr over 60 Minutes Intravenous Every 12 hours 06/02/21 0137 06/02/21 0138  ? 06/02/21 0115  ciprofloxacin (CIPRO) IVPB 400 mg       ? 400 mg ?200 mL/hr over 60 Minutes Intravenous  Once 06/02/21 0105 06/02/21 0244  ? 06/02/21 0115  metroNIDAZOLE (FLAGYL) IVPB 500 mg       ? 500 mg ?100 mL/hr over 60 Minutes Intravenous  Once 06/02/21 0105 06/02/21 0244  ? ?  ? ? 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 Doing well.  ?Scheduled tylenol and toradol, IV PRN ?IS, OOB ?Npo, NG in place, until bowel function ?Ostomy edematous but making some sweat, will await function ?Daily dressing change with iodoform ?Labs in AM, lytes replaced ?Leukocytosis improving, Cipro and flagl ?SCDs, lovenox reordered, H&H stable and should be good to restart after prior bloody Bms ?Stepdown unit ?Update patient and his friend at patient's request.  ? ? LOS: 2 days  ? ? ?Virl Cagey ?06/04/2021 ? ?

## 2021-06-05 LAB — BASIC METABOLIC PANEL
Anion gap: 8 (ref 5–15)
BUN: 10 mg/dL (ref 6–20)
CO2: 25 mmol/L (ref 22–32)
Calcium: 7.9 mg/dL — ABNORMAL LOW (ref 8.9–10.3)
Chloride: 104 mmol/L (ref 98–111)
Creatinine, Ser: 0.63 mg/dL (ref 0.61–1.24)
GFR, Estimated: >60 mL/min (ref >60)
Glucose, Bld: 100 mg/dL — ABNORMAL HIGH (ref 70–99)
Potassium: 3.7 mmol/L (ref 3.5–5.1)
Sodium: 137 mmol/L (ref 135–145)

## 2021-06-05 LAB — CBC WITH DIFFERENTIAL/PLATELET
Abs Immature Granulocytes: 0.09 10*3/uL — ABNORMAL HIGH (ref 0.00–0.07)
Basophils Absolute: 0 10*3/uL (ref 0.0–0.1)
Basophils Relative: 0 %
Eosinophils Absolute: 0.1 10*3/uL (ref 0.0–0.5)
Eosinophils Relative: 1 %
HCT: 44.4 % (ref 39.0–52.0)
Hemoglobin: 14.5 g/dL (ref 13.0–17.0)
Immature Granulocytes: 1 %
Lymphocytes Relative: 9 %
Lymphs Abs: 1.2 10*3/uL (ref 0.7–4.0)
MCH: 32.6 pg (ref 26.0–34.0)
MCHC: 32.7 g/dL (ref 30.0–36.0)
MCV: 99.8 fL (ref 80.0–100.0)
Monocytes Absolute: 0.9 10*3/uL (ref 0.1–1.0)
Monocytes Relative: 7 %
Neutro Abs: 10.8 10*3/uL — ABNORMAL HIGH (ref 1.7–7.7)
Neutrophils Relative %: 82 %
Platelets: 243 10*3/uL (ref 150–400)
RBC: 4.45 MIL/uL (ref 4.22–5.81)
RDW: 12.9 % (ref 11.5–15.5)
WBC: 13.2 10*3/uL — ABNORMAL HIGH (ref 4.0–10.5)
nRBC: 0 % (ref 0.0–0.2)

## 2021-06-05 LAB — PHOSPHORUS: Phosphorus: 1.5 mg/dL — ABNORMAL LOW (ref 2.5–4.6)

## 2021-06-05 LAB — MAGNESIUM: Magnesium: 1.9 mg/dL (ref 1.7–2.4)

## 2021-06-05 MED ORDER — AMLODIPINE BESYLATE 5 MG PO TABS
5.0000 mg | ORAL_TABLET | Freq: Every day | ORAL | Status: DC
  Administered 2021-06-05 – 2021-06-06 (×2): 5 mg via ORAL
  Filled 2021-06-05 (×2): qty 1

## 2021-06-05 MED ORDER — SODIUM PHOSPHATES 45 MMOLE/15ML IV SOLN
30.0000 mmol | Freq: Once | INTRAVENOUS | Status: AC
  Administered 2021-06-05: 30 mmol via INTRAVENOUS
  Filled 2021-06-05: qty 10

## 2021-06-05 MED ORDER — HYDRALAZINE HCL 20 MG/ML IJ SOLN
5.0000 mg | Freq: Four times a day (QID) | INTRAMUSCULAR | Status: DC | PRN
  Administered 2021-06-05 – 2021-06-06 (×5): 5 mg via INTRAVENOUS
  Filled 2021-06-05 (×5): qty 1

## 2021-06-05 MED ORDER — NICOTINE 14 MG/24HR TD PT24
14.0000 mg | MEDICATED_PATCH | Freq: Every day | TRANSDERMAL | Status: DC
  Administered 2021-06-05 – 2021-06-07 (×3): 14 mg via TRANSDERMAL
  Filled 2021-06-05 (×3): qty 1

## 2021-06-05 NOTE — Progress Notes (Signed)
Bladder scan showed 348 mL. Pt wanted to sit on the toilet to try and help pass urine as sitting in the chair wasn't helping. Pt was able to urinate, yet nursing staff was unable to measure output. Looked like a decent amount and patient felt much better after urinating. Pt currently still sitting in chair. Will continue to monitor.  ?

## 2021-06-05 NOTE — Progress Notes (Signed)
[facility] Progress Note ? ?2 Days Post-Op  ?Subjective: ?Pain improving but distended.  ? ?Objective: ?Vital signs in last 24 hours: ?Temp:  [97.6 ?F (36.4 ?C)-99.2 ?F (37.3 ?C)] 98.4 ?F (36.9 ?C) (03/19 1128) ?Pulse Rate:  [71-107] 87 (03/19 1430) ?Resp:  [9-28] 19 (03/19 1430) ?BP: (151-207)/(91-160) 207/99 (03/19 1430) ?SpO2:  [93 %-98 %] 97 % (03/19 1430) ?Weight:  [65.3 kg] 65.3 kg (03/19 0531) ?Last BM Date : 06/02/21 ? ?Intake/Output from previous day: ?03/18 0701 - 03/19 0700 ?In: 3269.8 [I.V.:2583.1; IV Piggyback:686.7] ?Out: 2175 [Urine:1325; Emesis/NG output:850] ?Intake/Output this shift: ?Total I/O ?In: 822.3 [I.V.:753.9; IV Piggyback:68.4] ?Out: 25 [Stool:25] ? ?General appearance: alert and no distress ?GI: distended, midline with dressing removed, ostomy edematous and pink, ostomy bag with minimal gas ? ?Lab Results:  ?Recent Labs  ?  06/04/21 ?0411 06/05/21 ?9371  ?WBC 16.6* 13.2*  ?HGB 15.2 14.5  ?HCT 46.1 44.4  ?PLT 218 243  ? ?BMET ?Recent Labs  ?  06/04/21 ?0411 06/05/21 ?0433  ?NA 134* 137  ?K 3.9 3.7  ?CL 104 104  ?CO2 22 25  ?GLUCOSE 131* 100*  ?BUN 9 10  ?CREATININE 0.62 0.63  ?CALCIUM 7.8* 7.9*  ? ?PT/INR ?No results for input(s): LABPROT, INR in the last 72 hours. ? ?Studies/Results: ?No results found. ? ?Anti-infectives: ?Anti-infectives (From admission, onward)  ? ? Start     Dose/Rate Route Frequency Ordered Stop  ? 06/02/21 1400  ciprofloxacin (CIPRO) IVPB 400 mg       ? 400 mg ?200 mL/hr over 60 Minutes Intravenous Every 12 hours 06/02/21 0137    ? 06/02/21 1400  metroNIDAZOLE (FLAGYL) IVPB 500 mg       ? 500 mg ?100 mL/hr over 60 Minutes Intravenous Every 12 hours 06/02/21 0138 06/09/21 1359  ? 06/02/21 0600  aztreonam (AZACTAM) 2 g in sodium chloride 0.9 % 100 mL IVPB  Status:  Discontinued       ? 2 g ?200 mL/hr over 30 Minutes Intravenous Every 8 hours 06/02/21 0310 06/02/21 1005  ? 06/02/21 0145  aztreonam (AZACTAM) 2 g in sodium chloride 0.9 % 100 mL IVPB   Status:  Discontinued       ? 2 g ?200 mL/hr over 30 Minutes Intravenous  Once 06/02/21 0137 06/02/21 0137  ? 06/02/21 0145  metroNIDAZOLE (FLAGYL) IVPB 500 mg  Status:  Discontinued       ? 500 mg ?100 mL/hr over 60 Minutes Intravenous Every 12 hours 06/02/21 0137 06/02/21 0138  ? 06/02/21 0115  ciprofloxacin (CIPRO) IVPB 400 mg       ? 400 mg ?200 mL/hr over 60 Minutes Intravenous  Once 06/02/21 0105 06/02/21 0244  ? 06/02/21 0115  metroNIDAZOLE (FLAGYL) IVPB 500 mg       ? 500 mg ?100 mL/hr over 60 Minutes Intravenous  Once 06/02/21 0105 06/02/21 0244  ? ?  ? ? 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  ? ?Scheduled tylenol and toradol, IV PRN ?IS, OOB ?Hypertension, Norvasc ordered, PRNs ordered  ?Npo, NG in place, until bowel function ?Ostomy edematous but making some sweat ?Ostomy RN ordered  ?Daily dressing change with iodoform ?Labs in AM, lytes replaced ?Leukocytosis improving, Cipro and flagl ?SCDs, lovenox ?Foley removed ?Stepdown unit ? ? LOS: 3 days  ? ? ?Virl Cagey ?06/05/2021 ? ?

## 2021-06-06 LAB — AEROBIC/ANAEROBIC CULTURE W GRAM STAIN (SURGICAL/DEEP WOUND)

## 2021-06-06 LAB — CBC WITH DIFFERENTIAL/PLATELET
Abs Immature Granulocytes: 0.07 10*3/uL (ref 0.00–0.07)
Basophils Absolute: 0.1 10*3/uL (ref 0.0–0.1)
Basophils Relative: 1 %
Eosinophils Absolute: 0.2 10*3/uL (ref 0.0–0.5)
Eosinophils Relative: 2 %
HCT: 41.8 % (ref 39.0–52.0)
Hemoglobin: 13.8 g/dL (ref 13.0–17.0)
Immature Granulocytes: 1 %
Lymphocytes Relative: 13 %
Lymphs Abs: 1.5 10*3/uL (ref 0.7–4.0)
MCH: 32.6 pg (ref 26.0–34.0)
MCHC: 33 g/dL (ref 30.0–36.0)
MCV: 98.8 fL (ref 80.0–100.0)
Monocytes Absolute: 0.9 10*3/uL (ref 0.1–1.0)
Monocytes Relative: 8 %
Neutro Abs: 8.6 10*3/uL — ABNORMAL HIGH (ref 1.7–7.7)
Neutrophils Relative %: 75 %
Platelets: 236 10*3/uL (ref 150–400)
RBC: 4.23 MIL/uL (ref 4.22–5.81)
RDW: 12.8 % (ref 11.5–15.5)
WBC: 11.3 10*3/uL — ABNORMAL HIGH (ref 4.0–10.5)
nRBC: 0 % (ref 0.0–0.2)

## 2021-06-06 LAB — BASIC METABOLIC PANEL
Anion gap: 9 (ref 5–15)
BUN: 8 mg/dL (ref 6–20)
CO2: 24 mmol/L (ref 22–32)
Calcium: 7.8 mg/dL — ABNORMAL LOW (ref 8.9–10.3)
Chloride: 102 mmol/L (ref 98–111)
Creatinine, Ser: 0.59 mg/dL — ABNORMAL LOW (ref 0.61–1.24)
GFR, Estimated: >60 mL/min (ref >60)
Glucose, Bld: 113 mg/dL — ABNORMAL HIGH (ref 70–99)
Potassium: 3.2 mmol/L — ABNORMAL LOW (ref 3.5–5.1)
Sodium: 135 mmol/L (ref 135–145)

## 2021-06-06 LAB — PHOSPHORUS: Phosphorus: 2.5 mg/dL (ref 2.5–4.6)

## 2021-06-06 MED ORDER — DIPHENHYDRAMINE HCL 50 MG/ML IJ SOLN: 25.0000 mg | Freq: Once | INTRAMUSCULAR | Status: DC | PRN

## 2021-06-06 MED ORDER — THIAMINE HCL 100 MG/ML IJ SOLN: 100.0000 mg | Freq: Every day | INTRAMUSCULAR | Status: DC

## 2021-06-06 MED ORDER — AMLODIPINE BESYLATE 5 MG PO TABS
5.0000 mg | ORAL_TABLET | Freq: Once | ORAL | Status: AC
  Administered 2021-06-06: 5 mg via ORAL
  Filled 2021-06-06: qty 1

## 2021-06-06 MED ORDER — LORAZEPAM 1 MG PO TABS: 1.0000 mg | ORAL_TABLET | ORAL | Status: DC | PRN

## 2021-06-06 MED ORDER — METOPROLOL TARTRATE 5 MG/5ML IV SOLN
5.0000 mg | Freq: Four times a day (QID) | INTRAVENOUS | Status: DC | PRN
  Administered 2021-06-07: 5 mg via INTRAVENOUS
  Filled 2021-06-06 (×2): qty 5

## 2021-06-06 MED ORDER — AMLODIPINE BESYLATE 5 MG PO TABS: 10.0000 mg | ORAL_TABLET | Freq: Every day | ORAL | Status: DC

## 2021-06-06 MED ORDER — ADULT MULTIVITAMIN W/MINERALS CH
1.0000 | ORAL_TABLET | Freq: Every day | ORAL | Status: DC
  Administered 2021-06-07 – 2021-06-09 (×3): 1 via ORAL
  Filled 2021-06-06 (×3): qty 1

## 2021-06-06 MED ORDER — THIAMINE HCL 100 MG PO TABS
100.0000 mg | ORAL_TABLET | Freq: Every day | ORAL | Status: DC
  Administered 2021-06-07 – 2021-06-09 (×3): 100 mg via ORAL
  Filled 2021-06-06 (×3): qty 1

## 2021-06-06 MED ORDER — POTASSIUM CHLORIDE 10 MEQ/100ML IV SOLN
10.0000 meq | INTRAVENOUS | Status: AC
  Administered 2021-06-06 (×4): 10 meq via INTRAVENOUS
  Filled 2021-06-06 (×4): qty 100

## 2021-06-06 MED ORDER — SODIUM CHLORIDE 0.9 % IV SOLN
3.0000 g | Freq: Four times a day (QID) | INTRAVENOUS | Status: DC
  Administered 2021-06-06 – 2021-06-09 (×11): 3 g via INTRAVENOUS
  Filled 2021-06-06 (×16): qty 8

## 2021-06-06 MED ORDER — FOLIC ACID 1 MG PO TABS
1.0000 mg | ORAL_TABLET | Freq: Every day | ORAL | Status: DC
  Administered 2021-06-07 – 2021-06-09 (×3): 1 mg via ORAL
  Filled 2021-06-06 (×3): qty 1

## 2021-06-06 MED ORDER — LORAZEPAM 2 MG/ML IJ SOLN
1.0000 mg | INTRAMUSCULAR | Status: DC | PRN
  Administered 2021-06-07: 2 mg via INTRAVENOUS
  Filled 2021-06-06: qty 1

## 2021-06-06 MED ORDER — LORAZEPAM 2 MG/ML IJ SOLN: 0.0000 mg | Freq: Two times a day (BID) | INTRAMUSCULAR | Status: DC

## 2021-06-06 MED ORDER — EPINEPHRINE 0.3 MG/0.3ML IJ SOAJ
0.3000 mg | Freq: Once | INTRAMUSCULAR | Status: DC | PRN
  Filled 2021-06-06: qty 0.6

## 2021-06-06 MED ORDER — LORAZEPAM 2 MG/ML IJ SOLN
0.0000 mg | Freq: Four times a day (QID) | INTRAMUSCULAR | Status: DC
  Administered 2021-06-06 – 2021-06-07 (×2): 2 mg via INTRAVENOUS
  Filled 2021-06-06 (×2): qty 1

## 2021-06-06 MED ORDER — AMOXICILLIN 250 MG PO CAPS
500.0000 mg | ORAL_CAPSULE | Freq: Once | ORAL | Status: AC
  Administered 2021-06-06: 500 mg via ORAL
  Filled 2021-06-06: qty 2

## 2021-06-06 MED ORDER — METOPROLOL TARTRATE 5 MG/5ML IV SOLN
INTRAVENOUS | Status: AC
  Administered 2021-06-06: 5 mg via INTRAVENOUS
  Filled 2021-06-06: qty 5
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
## 2021-06-06 NOTE — Progress Notes (Addendum)
I was present with the medical student for this service. I personally verified the history of present illness, performed the physical exam, and made the plan for this encounter. I have verified the medical student's documentation and made modifications where appropriately. I have personally documented in my own words a brief history, physical, and plan below.    ? ?Bloated. Ostomy edematous. Ostomy RN worked with him.  ? ?BP remains high.  ? ?Norvasc increased to 10 ?NG, NPO ?Amoxicillin challenge started  ?E coli and enterococcus in culture ?Awaiting colostomy function ? ?Curlene Labrum, MD ?[facility] ?[place], [place] [address] ?[phone] (office) ? ? ?[facility] Progress Note ? ?3 Days Post-Op  ?Subjective: ?Dakota Bryant is a 51 y.o. male on post op day 3 from sigmoid colectomy with end colostomy for perforated diverticulitis. He states that today he is having no pain or nausea but states that his abdomen feels full and swollen. He states that he was not able to tolerate apple juice yesterday but has been tolerating water and ice chips. He states he has been urinating and passing a small amount of gas but has not had a bowel movement. He denies chest pain, shortness of breath, leg pain, leg swelling.  ? ?Objective: ?Vital signs in last 24 hours: ?Temp:  [98 ?F (36.7 ?C)-100 ?F (37.8 ?C)] 98 ?F (36.7 ?C) (03/20 6294) ?Pulse Rate:  [71-107] 92 (03/20 0718) ?Resp:  [11-28] 12 (03/20 7654) ?BP: (152-208)/(90-160) 184/104 (03/20 0700) ?SpO2:  [93 %-100 %] 99 % (03/20 0718) ?Weight:  [63.7 kg] 63.7 kg (03/20 0500) ?Last BM Date : 06/02/21 ? ?Intake/Output from previous day: ?03/19 0701 - 03/20 0700 ?In: 3128.6 [I.V.:2370.9; IV Piggyback:757.7] ?Out: 2725 [Urine:1000; Emesis/NG output:1700; Stool:25] ?Intake/Output this shift: ?No intake/output data recorded. ? ?Constitutional: well appearing and sitting in bed, in no acute  distress ?Cardiovascular: regular rate and rhythm, no m/r/g ?Pulmonary/Chest: normal work of breathing on room air, lungs clear to auscultation bilaterally ?Abdominal: soft, mild diffuse tenderness to palpation, mild abdominal distention, bowel sounds present, colostomy in place containing a small amount of dark fluid. No redness or signs of infection surrounding the colostomy site. ?Ext: No leg swelling or tenderness ?Neurological: alert and answering questions appropriately ? ? ?Lab Results:  ?Recent Labs  ?  06/05/21 ?0433 06/06/21 ?0310  ?WBC 13.2* 11.3*  ?HGB 14.5 13.8  ?HCT 44.4 41.8  ?PLT 243 236  ? ?BMET ?Recent Labs  ?  06/05/21 ?0433 06/06/21 ?0310  ?NA 137 135  ?K 3.7 3.2*  ?CL 104 102  ?CO2 25 24  ?GLUCOSE 100* 113*  ?BUN 10 8  ?CREATININE 0.63 0.59*  ?CALCIUM 7.9* 7.8*  ? ?PT/INR ?No results for input(s): LABPROT, INR in the last 72 hours. ? ?Studies/Results: ?No results found. ? ?Anti-infectives: ?Anti-infectives (From admission, onward)  ? ? Start     Dose/Rate Route Frequency Ordered Stop  ? 06/02/21 1400  ciprofloxacin (CIPRO) IVPB 400 mg       ? 400 mg ?200 mL/hr over 60 Minutes Intravenous Every 12 hours 06/02/21 0137    ? 06/02/21 1400  metroNIDAZOLE (FLAGYL) IVPB 500 mg       ? 500 mg ?100 mL/hr over 60 Minutes Intravenous Every 12 hours 06/02/21 0138 06/09/21 1359  ? 06/02/21 0600  aztreonam (AZACTAM) 2 g in sodium chloride 0.9 % 100 mL IVPB  Status:  Discontinued       ? 2 g ?200 mL/hr over 30 Minutes Intravenous Every  8 hours 06/02/21 0310 06/02/21 1005  ? 06/02/21 0145  aztreonam (AZACTAM) 2 g in sodium chloride 0.9 % 100 mL IVPB  Status:  Discontinued       ? 2 g ?200 mL/hr over 30 Minutes Intravenous  Once 06/02/21 0137 06/02/21 0137  ? 06/02/21 0145  metroNIDAZOLE (FLAGYL) IVPB 500 mg  Status:  Discontinued       ? 500 mg ?100 mL/hr over 60 Minutes Intravenous Every 12 hours 06/02/21 0137 06/02/21 0138  ? 06/02/21 0115  ciprofloxacin (CIPRO) IVPB 400 mg       ? 400 mg ?200 mL/hr over 60  Minutes Intravenous  Once 06/02/21 0105 06/02/21 0244  ? 06/02/21 0115  metroNIDAZOLE (FLAGYL) IVPB 500 mg       ? 500 mg ?100 mL/hr over 60 Minutes Intravenous  Once 06/02/21 0105 06/02/21 0244  ? ?  ? ? ?Assessment/Plan: ?s/p Procedure(s): ?SIGMOID COLECTOMY WITH COLOSTOMY ? ?Continue Tylenol, Toradol and Zofran prn for pain and nausea ?Clear liquids until patient is passing more gas or has a BM ?Potassium mildly low at 3.2, continue to replace with Kcl ?WBC down to 11.3, continue Cipro and flagyl ?SCDs, Lovenox ?Hypertensive up to 184/104, continue hydralazine, amlodipine ? ? ? LOS: 4 days  ? ? ?Elane Fritz ?06/06/2021  ?

## 2021-06-06 NOTE — Consult Note (Signed)
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 ?Stomal assessment/size: 1 and 1/8 inches round, red, raised, and edematous. Lumen is in center and is currently pointed downward. ?Peristomal assessment: Intact ?Treatment options for stomal/peristomal skin:  ?Output: small amount serous material in pouch ?Ostomy pouching: 2pc. 2 and 3/4 inch pouching system with skin barrier ring. I will downsize to 2 and 1/4 inch with next pouch change. ?Education provided:  ?Note: Patient is in the ICU/SD unit with an NG tube in place. He is shivering and covered with additional blankets during my visit. Teaching/learning opportunity is limited, but patient is cooperative and engaged. ?Explained role of ostomy nurse and creation of stoma  ?Explained stoma characteristics (budded, flush, color, texture, care) ?Patient is shown the closure mechanism of the pouching system and I demonstrate this three times. He is able to give a return demonstration of the Lock and Roll closure mechanism and demonstrate how to hold the pouch in one hand and open the bottom of the pouch for emptying. ? ?Patient reports that he had right (dominant) hand surgery at the end of last year. He rehabbed without PT and has adequate grip strength to perform closure of pouch. He reports he has a sister at home with "male" cancer and that she is not able to assist him in his care or even provide meals. ? ?He has no income source and will need assistance with supplies. I will bring paper work for [employer] supply program from [place] with me for next visit on Wednesday, 06/08/21. ? ? ?Enrolled patient in [place] program: No  ? ? ?[facility] nursing team will follow, and will remain available to this patient, the nursing and medical teams.   ?Thanks, ?Maudie Flakes, MSN, RN, [place], [place], CWON-AP, [place]  ?Pager# [phone]  ?

## 2021-06-07 ENCOUNTER — Inpatient Hospital Stay (HOSPITAL_COMMUNITY): Payer: Self-pay

## 2021-06-07 ENCOUNTER — Encounter (HOSPITAL_COMMUNITY): Payer: Self-pay | Admitting: General Surgery

## 2021-06-07 DIAGNOSIS — F10139 Alcohol abuse with withdrawal, unspecified: Secondary | ICD-10-CM | POA: Diagnosis present

## 2021-06-07 DIAGNOSIS — K5720 Diverticulitis of large intestine with perforation and abscess without bleeding: Secondary | ICD-10-CM

## 2021-06-07 LAB — CBC WITH DIFFERENTIAL/PLATELET
Abs Immature Granulocytes: 0.12 10*3/uL — ABNORMAL HIGH (ref 0.00–0.07)
Basophils Absolute: 0.1 10*3/uL (ref 0.0–0.1)
Basophils Relative: 1 %
Eosinophils Absolute: 0.2 10*3/uL (ref 0.0–0.5)
Eosinophils Relative: 2 %
HCT: 42.1 % (ref 39.0–52.0)
Hemoglobin: 13.9 g/dL (ref 13.0–17.0)
Immature Granulocytes: 1 %
Lymphocytes Relative: 18 %
Lymphs Abs: 2 10*3/uL (ref 0.7–4.0)
MCH: 32.4 pg (ref 26.0–34.0)
MCHC: 33 g/dL (ref 30.0–36.0)
MCV: 98.1 fL (ref 80.0–100.0)
Monocytes Absolute: 1 10*3/uL (ref 0.1–1.0)
Monocytes Relative: 10 %
Neutro Abs: 7.2 10*3/uL (ref 1.7–7.7)
Neutrophils Relative %: 68 %
Platelets: 251 10*3/uL (ref 150–400)
RBC: 4.29 MIL/uL (ref 4.22–5.81)
RDW: 12.6 % (ref 11.5–15.5)
WBC Morphology: REACTIVE
WBC: 10.6 10*3/uL — ABNORMAL HIGH (ref 4.0–10.5)
nRBC: 0 % (ref 0.0–0.2)

## 2021-06-07 LAB — COMPREHENSIVE METABOLIC PANEL
ALT: 12 U/L (ref 0–44)
AST: 17 U/L (ref 15–41)
Albumin: 2.4 g/dL — ABNORMAL LOW (ref 3.5–5.0)
Alkaline Phosphatase: 33 U/L — ABNORMAL LOW (ref 38–126)
Anion gap: 7 (ref 5–15)
BUN: 8 mg/dL (ref 6–20)
CO2: 24 mmol/L (ref 22–32)
Calcium: 7.9 mg/dL — ABNORMAL LOW (ref 8.9–10.3)
Chloride: 106 mmol/L (ref 98–111)
Creatinine, Ser: 0.59 mg/dL — ABNORMAL LOW (ref 0.61–1.24)
GFR, Estimated: >60 mL/min (ref >60)
Glucose, Bld: 92 mg/dL (ref 70–99)
Potassium: 3.5 mmol/L (ref 3.5–5.1)
Sodium: 137 mmol/L (ref 135–145)
Total Bilirubin: 0.7 mg/dL (ref 0.3–1.2)
Total Protein: 4.9 g/dL — ABNORMAL LOW (ref 6.5–8.1)

## 2021-06-07 MED ORDER — ZIPRASIDONE MESYLATE 20 MG IM SOLR
10.0000 mg | Freq: Once | INTRAMUSCULAR | Status: DC
Start: 2021-06-07 — End: 2021-06-09
  Filled 2021-06-07 (×2): qty 20

## 2021-06-07 MED ORDER — ONDANSETRON 4 MG PO TBDP: 4.0000 mg | ORAL_TABLET | Freq: Four times a day (QID) | ORAL | Status: DC | PRN

## 2021-06-07 MED ORDER — DEXMEDETOMIDINE HCL IN NACL 400 MCG/100ML IV SOLN
0.4000 ug/kg/h | INTRAVENOUS | Status: DC
  Administered 2021-06-07: 0.898 ug/kg/h via INTRAVENOUS
  Administered 2021-06-07: 0.4 ug/kg/h via INTRAVENOUS
  Filled 2021-06-07 (×3): qty 100

## 2021-06-07 MED ORDER — CHLORDIAZEPOXIDE HCL 25 MG PO CAPS
25.0000 mg | ORAL_CAPSULE | ORAL | Status: DC
  Administered 2021-06-09: 25 mg via ORAL
  Filled 2021-06-07 (×2): qty 1

## 2021-06-07 MED ORDER — CHLORDIAZEPOXIDE HCL 25 MG PO CAPS
25.0000 mg | ORAL_CAPSULE | Freq: Four times a day (QID) | ORAL | Status: AC
  Administered 2021-06-07 (×4): 25 mg via ORAL
  Filled 2021-06-07 (×4): qty 1

## 2021-06-07 MED ORDER — CHLORDIAZEPOXIDE HCL 25 MG PO CAPS: 25.0000 mg | ORAL_CAPSULE | Freq: Every day | ORAL | Status: DC

## 2021-06-07 MED ORDER — HYDRALAZINE HCL 20 MG/ML IJ SOLN
10.0000 mg | Freq: Three times a day (TID) | INTRAMUSCULAR | Status: DC | PRN
  Administered 2021-06-07: 10 mg via INTRAVENOUS
  Filled 2021-06-07: qty 1

## 2021-06-07 MED ORDER — METOPROLOL TARTRATE 5 MG/5ML IV SOLN
5.0000 mg | Freq: Once | INTRAVENOUS | Status: AC
  Administered 2021-06-07: 5 mg via INTRAVENOUS

## 2021-06-07 MED ORDER — LABETALOL HCL 200 MG PO TABS
200.0000 mg | ORAL_TABLET | Freq: Two times a day (BID) | ORAL | Status: DC
  Administered 2021-06-07 – 2021-06-09 (×5): 200 mg via ORAL
  Filled 2021-06-07 (×5): qty 1

## 2021-06-07 MED ORDER — HALOPERIDOL LACTATE 5 MG/ML IJ SOLN
5.0000 mg | Freq: Once | INTRAMUSCULAR | Status: AC
  Administered 2021-06-07: 5 mg via INTRAVENOUS
  Filled 2021-06-07: qty 1

## 2021-06-07 MED ORDER — HYDROXYZINE HCL 25 MG PO TABS: 25.0000 mg | ORAL_TABLET | Freq: Four times a day (QID) | ORAL | Status: DC | PRN

## 2021-06-07 MED ORDER — CHLORDIAZEPOXIDE HCL 25 MG PO CAPS: 25.0000 mg | ORAL_CAPSULE | Freq: Four times a day (QID) | ORAL | Status: DC | PRN

## 2021-06-07 MED ORDER — CHLORDIAZEPOXIDE HCL 25 MG PO CAPS
25.0000 mg | ORAL_CAPSULE | Freq: Three times a day (TID) | ORAL | Status: AC
  Administered 2021-06-08 (×3): 25 mg via ORAL
  Filled 2021-06-07 (×3): qty 1

## 2021-06-07 MED ORDER — NICARDIPINE HCL IN NACL 20-0.86 MG/200ML-% IV SOLN
3.0000 mg/h | INTRAVENOUS | Status: DC
  Administered 2021-06-07: 5 mg/h via INTRAVENOUS
  Filled 2021-06-07: qty 200

## 2021-06-07 NOTE — Progress Notes (Addendum)
[facility] Progress Note ? ?4 Days Post-Op  ?Subjective: ?Agitated last night. Just finding out about this in the AM after reading the chart and discussing with med student. Received ativan and precedex last night for agitation and is resting. Appreciate hospitalist helping with his agitation last night and getting this under control.  Does have some reported alcohol abuse history but friend says he thought he did not have that issue any more. ? ?Ng came out in the commotion.  ? ?Tried to update his mom and left a message. Updated his friend, Dr. Laurance Flatten.  ? ?Resting in bed now.  ? ?Objective: ?Vital signs in last 24 hours: ?Temp:  [97.8 ?F (36.6 ?C)-99.4 ?F (37.4 ?C)] 97.8 ?F (36.6 ?C) (03/21 0818) ?Pulse Rate:  [80-151] 80 (03/21 0630) ?Resp:  [10-33] 15 (03/21 0630) ?BP: (105-233)/(65-121) 123/66 (03/21 0630) ?SpO2:  [91 %-98 %] 91 % (03/21 0630) ?Weight:  [63.8 kg] 63.8 kg (03/21 0500) ?Last BM Date : 06/07/21 ? ?Intake/Output from previous day: ?03/20 0701 - 03/21 0700 ?In: 2645.9 [P.O.:130; I.V.:2215.9; IV Piggyback:300] ?Out: 2450 [Urine:1050; Emesis/NG output:1200; QQVZD:638] ?Intake/Output this shift: ?No intake/output data recorded. ? ?General appearance: no distress and sleeping, sedated ?Resp: normal work of breathing ?GI: soft,ostomy with succus, packing clean ? ?Lab Results:  ?Recent Labs  ?  06/05/21 ?7564 06/06/21 ?0310  ?WBC 13.2* 11.3*  ?HGB 14.5 13.8  ?HCT 44.4 41.8  ?PLT 243 236  ? ?BMET ?Recent Labs  ?  06/05/21 ?0433 06/06/21 ?0310  ?NA 137 135  ?K 3.7 3.2*  ?CL 104 102  ?CO2 25 24  ?GLUCOSE 100* 113*  ?BUN 10 8  ?CREATININE 0.63 0.59*  ?CALCIUM 7.9* 7.8*  ? ?PT/INR ?No results for input(s): LABPROT, INR in the last 72 hours. ? ?Studies/Results: ?No results found. ? ?Anti-infectives: ?Anti-infectives (From admission, onward)  ? ? Start     Dose/Rate Route Frequency Ordered Stop  ? 06/06/21 1700  Ampicillin-Sulbactam (UNASYN) 3 g in sodium chloride 0.9 % 100 mL IVPB       ? 3  g ?200 mL/hr over 30 Minutes Intravenous Every 6 hours 06/06/21 1521    ? 06/06/21 1430  amoxicillin (AMOXIL) capsule 500 mg       ? 500 mg Oral  Once 06/06/21 1228 06/06/21 1342  ? 06/02/21 1400  ciprofloxacin (CIPRO) IVPB 400 mg  Status:  Discontinued       ? 400 mg ?200 mL/hr over 60 Minutes Intravenous Every 12 hours 06/02/21 0137 06/06/21 1521  ? 06/02/21 1400  metroNIDAZOLE (FLAGYL) IVPB 500 mg  Status:  Discontinued       ? 500 mg ?100 mL/hr over 60 Minutes Intravenous Every 12 hours 06/02/21 0138 06/06/21 1521  ? 06/02/21 0600  aztreonam (AZACTAM) 2 g in sodium chloride 0.9 % 100 mL IVPB  Status:  Discontinued       ? 2 g ?200 mL/hr over 30 Minutes Intravenous Every 8 hours 06/02/21 0310 06/02/21 1005  ? 06/02/21 0145  aztreonam (AZACTAM) 2 g in sodium chloride 0.9 % 100 mL IVPB  Status:  Discontinued       ? 2 g ?200 mL/hr over 30 Minutes Intravenous  Once 06/02/21 0137 06/02/21 0137  ? 06/02/21 0145  metroNIDAZOLE (FLAGYL) IVPB 500 mg  Status:  Discontinued       ? 500 mg ?100 mL/hr over 60 Minutes Intravenous Every 12 hours 06/02/21 0137 06/02/21 0138  ? 06/02/21 0115  ciprofloxacin (CIPRO) IVPB 400 mg       ?  400 mg ?200 mL/hr over 60 Minutes Intravenous  Once 06/02/21 0105 06/02/21 0244  ? 06/02/21 0115  metroNIDAZOLE (FLAGYL) IVPB 500 mg       ? 500 mg ?100 mL/hr over 60 Minutes Intravenous  Once 06/02/21 0105 06/02/21 0244  ? ?  ? ? ?Assessment/Plan: ?Patient s/p sigmoid colectomy, colostomy for diverticulitis with perforation. Now with agitation and HTN likely from some degree of withdrawal. Doing ok this AM on precedex but is very sleepy/ sedated.  ? ?PRN for pain ?Precedex gtt ?Hospitalist consulted to help with withdrawal and wean gtts ?HTN on labetalol now ?CXR ordered given some hypoxia overnight  ?NG out ok to keep out ?Ostomy with output ?Wound packing order in place ?Labs ordered ?Amoxicillin trial good and on Unasyn now for Enterococcus and E coli intraabdominal ?SCD, lovenox ? ?Updated  friend, Left mom a voicemail. Do not have his father's number.  ? ? LOS: 5 days  ? ? ?Virl Cagey ?06/07/2021 ? ?

## 2021-06-07 NOTE — Progress Notes (Signed)
Paged to bedside for agitation and hypertension. Patient is delirious, but speech and language are normal.  Patient reports that he he has been taken out of the hospital and now he does not know where he is and nobody will tell him.  Reports he has been called a liar if you tell me is in the hospital.  He remains agitated after 2 mg of Ativan, Precedex drip started, Haldol 5 mg.  Geodon now ordered.  Patient also started on Cardene drip for hypertension.  He radically when patient is calm down his heart rate should improve.  Rate notes in the 140s-150s but he is agitated.  If it does not improve with patient calming down, may need to start diltiazem instead of Cardene.  He was given 5 mg IV metoprolol x1 which did not improve the heart rate.  After 2 mg of Ativan, 5 mg of Haldol, and Precedex drip, patient did pull out his NG tube.  Advised RN to leave it out for tonight less patient becomes nauseous, and restraints were added.  Geodon pending. ?

## 2021-06-07 NOTE — Assessment & Plan Note (Signed)
-  Continue Precedex drip wean off as tolerated. ?-Initiating Librium detox protocol ?-Follow CIWA score ?-Cessation counseling provided ?-Continue thiamine and folic acid. ?

## 2021-06-07 NOTE — Progress Notes (Signed)
[facility] ? ?Labs and CXR reassuring.  ? ?Will need that CT chest at some point for the question hilar mass on prior CT. ? ?Curlene Labrum, MD ?[facility] ?[place], [place] [address] ?[phone] (office) ? ?

## 2021-06-07 NOTE — Consult Note (Signed)
Initial Consultation Note ? ? ?Patient: Dakota Bryant JGO:[id number] DOB: 1970-11-15 PCP: Chipper Herb, MD (Inactive) ?DOA: 06/01/2021 ?DOS: the patient was seen and examined on 06/07/2021 ?Primary service: Virl Cagey, MD ? ?Referring physician: Dr. Constance Haw ?Reason for consult: Alcohol withdrawal, hypertension and assistance with medical management. ? ?Assessment and Plan: ?* Acute diverticulitis ?-Management as above. ?-Status post surgical intervention for sigmoidectomy. ? ?Perforated sigmoid colon ([place]) ?-This is secondary to acute sigmoid diverticulitis. ?-Status post sigmoidectomy with colostomy ?-Postoperative care per general surgery service. ?-Continue IV antibiotic ?-Still no signs of resumption intestinal movements ?-Continue analgesics management and Continue supportive care. ? ?Alcohol abuse with withdrawal ([place]) ?-Continue Precedex drip wean off as tolerated. ?-Initiating Librium detox protocol ?-Follow CIWA score ?-Cessation counseling provided ?-Continue thiamine and folic acid. ? ?Sepsis ([place]) ?-This is clearly secondary to perforated diverticulitis. ?-This is manifested by significant leukocytosis and tachycardia as well as tachypnea. ?-Sepsis features resolving appropriately. ?-Continue current IV antibiotics. ?-Continue IV fluids and advance diet as per surgical service recommendations. ? ?Hypertensive urgency ?- Patient ended requiring to be placed on Cardene ?-Labetalol has been also added ?-Will continue as needed hydralazine ?-Wean off Cardene drip as tolerated. ?-Part of his hypertension is driven by pain and alcohol withdrawal. ? ?GERD without esophagitis ?- Continue PPI. ? ? ?TRH will continue to follow the patient. ? ?HPI: Dakota Bryant is a 51 y.o. male with medical history significant for hypertension, GERD and ongoing tobacco abuse, who presented to the ER with acute onset of worsening abdominal pain over the last 3 days with no nausea or vomiting.  He has been constipated  and had a watery bowel movement after using the laxative.  He admitted to tactile fever and chills with diaphoresis.  He denied any chest pain or dyspnea or cough or wheezing.  He has been having diminished urine output.  No dysuria or hematuria, urgency or frequency or flank pain. ? ?ED Course: When he came to the ER, BP was 142/102 with a heart rate of 138 and otherwise normal vital signs.  CMP was remarkable for a sodium of 134, CO2 21 and total bili 1.8, with a lactic acid of 0.9 and CBC showed significant leukocytosis 20.6 with hemoconcentration with hemoglobin 18.9  and hematocrit 55 point.  Influenza antigens and COVID-19 PCR came back negative. ? ?Patient is status post laparotomy with sigmoidectomy and colostomy on 06/04/2019; still has not regained function of his bowels.  Postoperative care deliver by primary team (general surgery).  Has developed alcohol withdrawal and hypertensive urgency.  TRH has been consulted to assist with medical management. ? ?Review of Systems: As mentioned in the history of present illness. All other systems reviewed and are negative. ?History reviewed. No pertinent past medical history. ?Past Surgical History:  ?Procedure Laterality Date  ? COLOSTOMY N/A 06/03/2021  ? Procedure: SIGMOID COLECTOMY WITH COLOSTOMY;  Surgeon: Virl Cagey, MD;  Location: [facility];  Service: General;  Laterality: N/A;  ? HAND TENDON SURGERY    ? KNEE ARTHROSCOPY Left   ? ?Social History:  reports that he has been smoking cigarettes. He has been smoking an average of 1 pack per day. He has never used smokeless tobacco. He reports current alcohol use of about 2.0 standard drinks per week. He reports current drug use. Drug: Marijuana. ? ?No Known Allergies ? ?History reviewed. No pertinent family history. ? ?Prior to Admission medications   ?Medication Sig Start Date End Date Taking? Authorizing Provider  ?esomeprazole ([place]) 20  MG capsule Take 20 mg by mouth daily at 12 noon.   Yes [provider]  ?ibuprofen (ADVIL,MOTRIN) 200 MG tablet Take 200-400 mg by mouth every 6 (six) hours as needed for mild pain or moderate pain.    [provider]  ? ? ?Physical Exam: ?Vitals:  ? 06/07/21 1500 06/07/21 1600 06/07/21 1621 06/07/21 1700  ?BP: (!) 152/85 (!) 145/98  137/86  ?Pulse: 87 91  82  ?Resp: (!) 23 15  (!) 7  ?Temp:   98.2 ?F (36.8 ?C)   ?TempSrc:   Oral   ?SpO2: 93% 96%  97%  ?Weight:      ?Height:      ? ?General exam: Alert, awake, oriented x 3, [place] currently.  Receiving Precedex drip.  Able to follow simple commands.  Overnight and requiring Geodon, Ativan and initiation of Precedex drip due to ongoing agitation and behavioral disturbances. ?Respiratory system: Good air movement bilaterally; no using accessory muscles.  Respiratory effort normal.  No wheezing, no crackles, good saturation on room air. ?Cardiovascular system:Rate controlled; no JVD, no rubs or gallops. ?Gastrointestinal system: Abdomen is nondistended, soft and appropriately tender around colostomy laparotomy incision.  Wound is clean, dry and without purulence.  Colostomy with very small amount of liquid material; swollen pouch appreciated.   ?Central nervous system: Alert and oriented. No focal neurological deficits. ?Extremities: No cyanosis, clubbing or edema. ?Skin: No petechiae. ?Psychiatry: Judgement and insight appear stable currently; no agitation or restlessness on today's evaluation. ? ?Data Reviewed:  ?Potassium 3.5, sodium 137, BUN 8, creatinine 0.59 ?WBCs 10.6 ?-Hemoglobin 13.9, platelets count 251 K. ? ?Family Communication: No family at bedside. ?Primary team communication: Discussed in details plan of care with primary team (general service) Dr. Constance Haw. ? ?Thank you very much for involving [place] in the care of your patient. ? ?Author: ?Barton Dubois, MD ?06/07/2021 7:31 PM ? ?For on call review [email or web address].  ?

## 2021-06-07 NOTE — Progress Notes (Addendum)
[facility] ? ?Able to get contact numbers for family. ? ?Long [place], Stepmother, [phone] who is involved in his life.   ?Updated her and his father about the situation and the precedex.  ?Talked to him about the set back for discharged given the delirium likely related to drinking.  ? ?Ray lives with Selmer Dominion, his sister, and Lynelle Smoke is helping her with some health issues.  ? ?Tammy and his dad are probably not able to get there today. But will expect phone call from the SW about [place]. ? ?Curlene Labrum, MD ?[facility] ?[place], [place] [address] ?[phone] (office) ? ? ? ?

## 2021-06-07 NOTE — Progress Notes (Signed)
Patient alert and oriented, restraints discontinued. Patient able to follow commands and is cooperative with staff. Patient ambulated to bathroom with standby assist, patient able to empty colostomy and close pouch unassisted and verbalize and demonstrate process of cleaning pouch as well.  ?

## 2021-06-08 ENCOUNTER — Inpatient Hospital Stay (HOSPITAL_COMMUNITY): Payer: Self-pay

## 2021-06-08 ENCOUNTER — Encounter (HOSPITAL_COMMUNITY): Payer: Self-pay | Admitting: Radiology

## 2021-06-08 DIAGNOSIS — C3432 Malignant neoplasm of lower lobe, left bronchus or lung: Secondary | ICD-10-CM | POA: Diagnosis present

## 2021-06-08 DIAGNOSIS — R911 Solitary pulmonary nodule: Secondary | ICD-10-CM

## 2021-06-08 DIAGNOSIS — R918 Other nonspecific abnormal finding of lung field: Secondary | ICD-10-CM | POA: Diagnosis present

## 2021-06-08 LAB — CBC WITH DIFFERENTIAL/PLATELET
Abs Immature Granulocytes: 0.17 10*3/uL — ABNORMAL HIGH (ref 0.00–0.07)
Basophils Absolute: 0.1 10*3/uL (ref 0.0–0.1)
Basophils Relative: 1 %
Eosinophils Absolute: 0.3 10*3/uL (ref 0.0–0.5)
Eosinophils Relative: 3 %
HCT: 43.5 % (ref 39.0–52.0)
Hemoglobin: 14.8 g/dL (ref 13.0–17.0)
Immature Granulocytes: 2 %
Lymphocytes Relative: 19 %
Lymphs Abs: 2.2 10*3/uL (ref 0.7–4.0)
MCH: 33 pg (ref 26.0–34.0)
MCHC: 34 g/dL (ref 30.0–36.0)
MCV: 97.1 fL (ref 80.0–100.0)
Monocytes Absolute: 0.9 10*3/uL (ref 0.1–1.0)
Monocytes Relative: 8 %
Neutro Abs: 7.6 10*3/uL (ref 1.7–7.7)
Neutrophils Relative %: 67 %
Platelets: 321 10*3/uL (ref 150–400)
RBC: 4.48 MIL/uL (ref 4.22–5.81)
RDW: 12.7 % (ref 11.5–15.5)
WBC: 11.3 10*3/uL — ABNORMAL HIGH (ref 4.0–10.5)
nRBC: 0 % (ref 0.0–0.2)

## 2021-06-08 LAB — BASIC METABOLIC PANEL
Anion gap: 8 (ref 5–15)
BUN: 7 mg/dL (ref 6–20)
CO2: 22 mmol/L (ref 22–32)
Calcium: 7.7 mg/dL — ABNORMAL LOW (ref 8.9–10.3)
Chloride: 107 mmol/L (ref 98–111)
Creatinine, Ser: 0.57 mg/dL — ABNORMAL LOW (ref 0.61–1.24)
GFR, Estimated: >60 mL/min (ref >60)
Glucose, Bld: 96 mg/dL (ref 70–99)
Potassium: 3.4 mmol/L — ABNORMAL LOW (ref 3.5–5.1)
Sodium: 137 mmol/L (ref 135–145)

## 2021-06-08 LAB — SURGICAL PATHOLOGY

## 2021-06-08 LAB — GLUCOSE, CAPILLARY
Glucose-Capillary: 191 mg/dL — ABNORMAL HIGH (ref 70–99)
Glucose-Capillary: 113 mg/dL — ABNORMAL HIGH (ref 70–99)

## 2021-06-08 LAB — MAGNESIUM: Magnesium: 1.8 mg/dL (ref 1.7–2.4)

## 2021-06-08 MED ORDER — AMLODIPINE BESYLATE 5 MG PO TABS
5.0000 mg | ORAL_TABLET | Freq: Every day | ORAL | Status: DC
  Administered 2021-06-08: 5 mg via ORAL
  Filled 2021-06-08: qty 1

## 2021-06-08 MED ORDER — AMLODIPINE BESYLATE 5 MG PO TABS
10.0000 mg | ORAL_TABLET | Freq: Every day | ORAL | Status: DC
  Administered 2021-06-09: 10 mg via ORAL
  Filled 2021-06-08: qty 2

## 2021-06-08 MED ORDER — IOHEXOL 300 MG/ML  SOLN
100.0000 mL | Freq: Once | INTRAMUSCULAR | Status: AC | PRN
  Administered 2021-06-08: 75 mL via INTRAVENOUS

## 2021-06-08 MED ORDER — POTASSIUM CHLORIDE CRYS ER 20 MEQ PO TBCR
40.0000 meq | EXTENDED_RELEASE_TABLET | ORAL | Status: AC
  Administered 2021-06-08 (×2): 40 meq via ORAL
  Filled 2021-06-08 (×2): qty 2

## 2021-06-08 NOTE — Progress Notes (Signed)
Called [phone] to give receiving nurse report but is OTF at this time, will call this RN back for report  ?

## 2021-06-08 NOTE — Consult Note (Signed)
? ?NAME:  Dakota Bryant, MRN:  [id number], DOB:  [birth year], LOS: 6 ?ADMISSION DATE:  06/01/2021, CONSULTATION DATE:  06/08/21 ?REFERRING MD:  Denton Brick, CHIEF COMPLAINT:  lung mass   ? ?History of Present Illness:  ?17  yowm active smoker with medical history significant for hypertension, GERD   presented to the ER with acute onset of worsening abdominal pain over the last 3 days PTA  with no nausea or vomiting.  He has been constipated and had a watery bowel movement after using the laxative.  He admitted to tactile fever and chills with diaphoresis.  He denied any chest pain or dyspnea or cough or wheezing.  He has been having diminished urine output.  No dysuria or hematuria, urgency or frequency or flank pain. ? ?ED Course: When he came to the ER, BP was 142/102 with a heart rate of 138 and otherwise normal vital signs.  CMP was remarkable for a sodium of 134, CO2 21 and total bili 1.8, with a lactic acid of 0.9 and CBC showed significant leukocytosis 20.6 with hemoconcentration with hemoglobin 18.9  and hematocrit 55 point.  Influenza antigens and COVID-19 PCR came back negative. ?  ?Patient is status post laparotomy with sigmoidectomy and colostomy on 06/03/2020 still has not regained function of his bowels.  Postoperative care deliver by primary team (general surgery).  Has developed alcohol withdrawal and hypertensive urgency.   ? ? ?PCCM asked to consult 3/22 for incidental SPN  ?Gives hx of smoker's cough with rattling x 5-10 min q am but mucus always clear ?Not limited by breathing from desired activities  and works in [employer]  ?No prior h/o unintended wt loss, fever, chills, NS  ?  ? ?Significant Hospital Events: ?Including procedures, antibiotic start and stop dates in addition to other pertinent events   ?06/02/21  Abd/Pelvic CT : Acute perforated sigmoid diverticulitis.  No abscess. ?06/03/2020 >>> laparotomy with sigmoidectomy and colostomy on  ?Cipro/flagyl 3/15-3/20 ?Unasyn 3/20 >>> ? ? ?Scheduled  Meds: ? [START ON 06/09/2021] amLODipine  10 mg Oral Daily  ? chlordiazePOXIDE  25 mg Oral TID  ? Followed by  ? [START ON 06/09/2021] chlordiazePOXIDE  25 mg Oral BH-qamhs  ? Followed by  ? [START ON 06/10/2021] chlordiazePOXIDE  25 mg Oral Daily  ? Chlorhexidine Gluconate Cloth  6 each Topical Q0600  ? enoxaparin (LOVENOX) injection  40 mg Subcutaneous B34L  ? folic acid  1 mg Oral Daily  ? labetalol  200 mg Oral BID  ? multivitamin with minerals  1 tablet Oral Daily  ? pantoprazole (PROTONIX) IV  40 mg Intravenous QHS  ? thiamine  100 mg Oral Daily  ? Or  ? thiamine  100 mg Intravenous Daily  ? ziprasidone  10 mg Intramuscular Once  ? ?Continuous Infusions: ? 0.9 % NaCl with KCl 20 mEq / L 125 mL/hr at 06/08/21 1837  ? ampicillin-sulbactam (UNASYN) IV 200 mL/hr at 06/08/21 1753  ? ?PRN Meds:.chlordiazePOXIDE, EPINEPHrine, hydrALAZINE, HYDROmorphone (DILAUDID) injection, hydrOXYzine, methocarbamol, [DISCONTINUED] ondansetron **OR** ondansetron (ZOFRAN) IV, oxyCODONE, simethicone, sodium chloride, traZODone  ? ? ?Interim History / Subjective:  ?Feeling better, tol clear liquids ? ? ?Objective   ?Blood pressure (!) 171/110, pulse 98, temperature 98.5 ?F (36.9 ?C), temperature source Oral, resp. rate (!) 22, height 5\' 6"  (1.676 m), weight 63.2 kg, SpO2 98 %. ?   ?   ? ?Intake/Output Summary (Last 24 hours) at 06/08/2021 1238 ?Last data filed at 06/08/2021 9379 ?Gross per 24 hour  ?Intake  2773.13 ml  ?Output 2222 ml  ?Net 551.13 ml  ? ?Filed Weights  ? 06/06/21 0500 06/07/21 0500 06/08/21 0500  ?Weight: 63.7 kg 63.8 kg 63.2 kg  ? ? ?Examination: ? Tmax 99.3  ?General appearance:    middle aged wm nad   ?At Rest 02 sats  98% on RA  ?No jvd ?Oropharynx clear,  mucosa nl ?Neck supple ?Lungs with a few scattered exp > insp rhonchi bilaterally ?RRR no s3 or or sign murmur ?Abd soft/ nl  excursion / wounds clean and dry  ?Extr warm with no edema or clubbing noted ?Neuro  Sensorium intact ,  no apparent motor deficits.  ? ? ? ?I  personally reviewed images and agree with radiology impression as follows:  ? Chest CT with contrat 3/22 ?1. Left lower lobe solid infrahilar mass measuring up to 3.0 cm, ?concerning for primary lung malignancy. Consultation with ?Pulmonology or Thoracic Surgery recommended. ?2. Bilateral central predominant patchy ground-glass opacities, most ?pronounced in the upper lungs, likely infectious or inflammatory. ?3. Mild aortic Atherosclerosis (ICD10-I70.0) and Emphysema ?  ? ?Assessment & Plan:  ?1) Incidental LL Pulmonary  Nodule in active smoker quite medial and asymptomatic  ? ?>>> f/u PET and pfts in the next 6 weeks (to allow time to recover from present surgery and irradicate any potentil source of a false positives related to abd infection- if isolated on PETE with good pfts might proceed directly to segmentectomy o/w will f/u with [facility] ct / navigational bx but all thru the [place] office (PET and PFTs can be done here if scheduling allows)  ? ?2) GG densities on CT chest non specific ? Asp ? ALI related to GI sepsis  ?-note  gas exchange well preserved and nothing to add that is not already being done  ? ?3) Active smoker ? Copd/ no pfts on record ?Counseled re importance of maintaining smoking cessation  going forward ? ?Best Practice (right click and "Reselect all SmartList Selections" daily)  ? ?  ? ?Labs   ?CBC: ?Recent Labs  ?Lab 06/04/21 ?0411 06/05/21 ?0433 06/06/21 ?0310 06/07/21 ?9295 06/08/21 ?0414  ?WBC 16.6* 13.2* 11.3* 10.6* 11.3*  ?NEUTROABS 15.0* 10.8* 8.6* 7.2 7.6  ?HGB 15.2 14.5 13.8 13.9 14.8  ?HCT 46.1 44.4 41.8 42.1 43.5  ?MCV 100.0 99.8 98.8 98.1 97.1  ?PLT 218 243 236 251 321  ? ? ?Basic Metabolic Panel: ?Recent Labs  ?Lab 06/03/21 ?0431 06/04/21 ?0411 06/05/21 ?0433 06/06/21 ?0310 06/07/21 ?7473 06/08/21 ?0414  ?NA 134* 134* 137 135 137 137  ?K 4.3 3.9 3.7 3.2* 3.5 3.4*  ?CL 103 104 104 102 106 107  ?CO2 22 22 25 24 24 22   ?GLUCOSE 105* 131* 100* 113* 92 96  ?BUN 9 9 10 8 8 7   ?CREATININE  0.68 0.62 0.63 0.59* 0.59* 0.57*  ?CALCIUM 8.5* 7.8* 7.9* 7.8* 7.9* 7.7*  ?MG 1.8 1.8 1.9  --   --  1.8  ?PHOS 2.4* 2.6 1.5* 2.5  --   --   ? ?GFR: ?Estimated Creatinine Clearance: 96.6 mL/min (A) (by C-G formula based on SCr of 0.57 mg/dL (L)). ?Recent Labs  ?Lab 06/02/21 ?0139 06/02/21 ?4037 06/03/21 ?0431 06/05/21 ?0433 06/06/21 ?0310 06/07/21 ?0964 06/08/21 ?0414  ?PROCALCITON  --  4.62  --   --   --   --   --   ?WBC  --  25.1*   < > 13.2* 11.3* 10.6* 11.3*  ?LATICACIDVEN 0.9 0.9  --   --   --   --   --   ? < > =  values in this interval not displayed.  ? ? ?Liver Function Tests: ?Recent Labs  ?Lab 06/01/21 ?2131 06/07/21 ?0939  ?AST 14* 17  ?ALT 13 12  ?ALKPHOS 57 33*  ?BILITOT 1.8* 0.7  ?PROT 7.6 4.9*  ?ALBUMIN 4.3 2.4*  ? ?Recent Labs  ?Lab 06/01/21 ?2131  ?LIPASE 24  ? ?No results for input(s): AMMONIA in the last 168 hours. ? ?ABG ?No results found for: PHART, PCO2ART, PO2ART, HCO3, TCO2, ACIDBASEDEF, O2SAT  ? ?Coagulation Profile: ?Recent Labs  ?Lab 06/02/21 ?0439  ?INR 1.2  ? ? ?Cardiac Enzymes: ?No results for input(s): CKTOTAL, CKMB, CKMBINDEX, TROPONINI in the last 168 hours. ? ?HbA1C: ?No results found for: HGBA1C ? ?CBG: ?Recent Labs  ?Lab 06/03/21 ?1958  ?GLUCAP 129*  ? ?  ? ?Past Medical History:  ?He,  has no past medical history on file.  ? ?Surgical History:  ? ?Past Surgical History:  ?Procedure Laterality Date  ? COLOSTOMY N/A 06/03/2021  ? Procedure: SIGMOID COLECTOMY WITH COLOSTOMY;  Surgeon: Virl Cagey, MD;  Location: [facility];  Service: General;  Laterality: N/A;  ? HAND TENDON SURGERY    ? KNEE ARTHROSCOPY Left   ?  ? ?Social History:  ? reports that he has been smoking cigarettes. He has been smoking an average of 1 pack per day. He has never used smokeless tobacco. He reports current alcohol use of about 2.0 standard drinks per week. He reports current drug use. Drug: Marijuana.  ? ?Family History:  ?His family history is not on file.  ? ?Allergies ?No Known Allergies  ? ?Home  Medications  ?Prior to Admission medications   ?Medication Sig Start Date End Date Taking? Authorizing Provider  ?esomeprazole (NEXIUM) 20 MG capsule Take 20 mg by mouth daily at 12 noon.   Yes Provider,

## 2021-06-08 NOTE — Progress Notes (Addendum)
[facility] ? ?CT chest with mass concerning for malignancy, patient aware. Pathology of colon back with diverticulitis. ? ?Completely awake and off sedative medication. Feeling better. Having bowel function.  ? ?BP (!) 171/110   Pulse 98   Temp 98.5 ?F (36.9 ?C) (Oral)   Resp (!) 22   Ht 5\' 6"  (1.676 m)   Wt 63.2 kg   SpO2 98%   BMI 22.49 kg/m?  ?Ostomy pink with stool ?Midline packed, minor drainage, no erythema  ? ?Patient s/p colostomy for diverticulitis with perforation. CT chest with mass concerning for malignancy. ?Soft diet ordered  ?May need stool softeners if thickens up ?PRN for pain ?Dr. Malachi Carl healing with HTN and withdrawal, much appreciated ?Ostomy RN saw patient and family, education done, charity care for ostomy set up ?Transition of care team also working to get him [facility]  ?Potentially home in the next few days from a colostomy stand point ?Will need 10  day course of antibiotics, started on Friday 3/17 and can end 3/27, can go home with Augmentin  ? ?Curlene Labrum, MD ?[facility] ?[place], [place] [address] ?[phone] (office) ? ? ?

## 2021-06-08 NOTE — Progress Notes (Signed)
Pt request not to have a nicotine patch, stated they have caused hallucinations in the past.  ? ?

## 2021-06-08 NOTE — Consult Note (Addendum)
[facility] Nurse ostomy follow up: POD 5 ?Stoma type/location: LLQ end colostomy.  Visit for teaching with patient, stepmother and father. ?Stomal assessment/size: 1 and 1/4 inches round, sightly raised, edematous ?Peristomal assessment: Clear ?Treatment options for stomal/peristomal skin: skin barrier ring ?Output: brown/green effluent ?Ostomy pouching: 2pc. 2 and 1/4 inch pouching system with skin barrier ring ?Education provided:  ?Explained role of ostomy nurse and creation of stoma  ?Explained stoma characteristics (budded, flush, color, texture, care) ?Demonstrated pouch change (cutting new skin barrier, measuring stoma, cleaning peristomal skin and stoma, use of barrier ring) ?Education on emptying when 1/3 to 1/2 full and how to empty ?Demonstrated "burping" flatus from pouch ?Demonstrated use of wick to clean spout  ?Answered patient/family questions:  Patient's stepmother is able to measure, trace and cut out skin barrier. Patient placed both skin barrier ring and skin barrier and attached pouch. He is proficient in Regulatory affairs officer. ? ?Supplies in room for discharge. ?Next pouch change is due on Sunday. Bedside RN to assist if he is still in hospital. ?  ?Enrolled patient in [place] Discharge program: Yes ? ?Also provided family and patient with indigent supply program information from [facility]. He is to call either from hospital or when he gets home. ? ?I recommend the [facility] outpatient ostomy clinic as an outpatient resource. If you agree, please fax referral, or enter one electronically in [facility] ([phone])  ? ?[facility] nursing team will continue to follow, and will remain available to this patient, the nursing and medical teams.   ? ?Thanks, ?Maudie Flakes, MSN, RN, [place], [place], CWON-[facility], [place]  ?Pager# [phone]  ? ? ?  ?

## 2021-06-08 NOTE — Progress Notes (Signed)
Report given to [place] on 300, patient transferring to room 330 ?

## 2021-06-08 NOTE — Progress Notes (Signed)
?PROGRESS NOTE ? ? ? ? ?Dakota Bryant, is a 51 y.o. male, DOB - [birth year], GGE:[id number] ? ?Admit date - 06/01/2021   Admitting Physician Christel Mormon, MD ? ?Outpatient Primary MD for the patient is Chipper Herb, MD (Inactive) ? ?LOS - 6 ? ?Chief Complaint  ?Patient presents with  ? Abdominal Pain  ?    ? ? ?Brief Narrative:  ? ?51 y.o. male with medical history significant for hypertension, GERD and ongoing tobacco abuse/COPD admitted on 06/02/2021 with acute sigmoid diverticulitis with perforation ? ?  ?-Assessment and Plan: ?1) sepsis secondary to acute sigmoid diverticulitis ?-s/p sigmoid colectomy with colostomy on 06/03/2021 for diverticulitis with perforation ?-Continue Unasyn ?-Advance diet per general surgery team ?-Ostomy wound care RN consult appreciated ? ? ?2) left lung mass--- CT chest with contrast showed Left lower lobe solid infrahilar mass measuring up to 3.0 cm, concerning for primary lung malignancy ?-Pulmonology consult from Dr. Melvyn Novas appreciated ?-PCCM recommends outpatient PET scan and PFTs in 6 weeks after recovery from surgery ?-Patient has close to 40 pack years ?-May be a candidate for surgery/segmentectomy ?-May be a candidate for CT navigational biopsy ?-Patient is aware of the need to follow-up as outpatient due to concern for very very high likelihood of malignancy ? ?3)HTN-- ?-Continue labetalol 200 twice daily ?-Weaned off IV Cardene drip ?-Start amlodipine ?IV hydralazine as needed elevated BP ? ?4)Alcohol abuse with withdrawal ([place]) ?-Successfully weaned off Precedex drip  ?-Continue Librium detox protocol ?-Cessation counseling provided ?-Continue thiamine and folic acid. ? ?5)GERD without esophagitis ?- Continue PPI. ? ?6) tobacco abuse/COPD--- bronchodilators as needed ?-Smoking cessation advised ? ?Disposition/Need for in-Hospital Stay- patient unable to be discharged at this time due to --- postoperative patient awaiting return of bowel function and tolerance of oral intake,  requiring IV fluids in the meantime ? ?Status is: Inpatient  ? ?Disposition: The patient is from: Home ?             Anticipated d/c is to: Home ?             Anticipated d/c date is: 1 day ?             Patient currently is not medically stable to d/c. ?Barriers: Not Clinically Stable-  ? ?Code Status :  -  Code Status: Full Code  ? ?Family Communication:    NA (patient is alert, awake and coherent)  ? ?DVT Prophylaxis  :   - SCDs   enoxaparin (LOVENOX) injection 40 mg Start: 06/04/21 1500 ?SCDs Start: 06/02/21 1232 ? ? ?Lab Results  ?Component Value Date  ? PLT 321 06/08/2021  ? ? ?Inpatient Medications ? ?Scheduled Meds: ? acetaminophen (TYLENOL) oral liquid 160 mg/5 mL  1,000 mg Oral Q6H  ? amLODipine  5 mg Oral Daily  ? chlordiazePOXIDE  25 mg Oral TID  ? Followed by  ? [START ON 06/09/2021] chlordiazePOXIDE  25 mg Oral BH-qamhs  ? Followed by  ? [START ON 06/10/2021] chlordiazePOXIDE  25 mg Oral Daily  ? Chlorhexidine Gluconate Cloth  6 each Topical Q0600  ? enoxaparin (LOVENOX) injection  40 mg Subcutaneous Y65K  ? folic acid  1 mg Oral Daily  ? labetalol  200 mg Oral BID  ? multivitamin with minerals  1 tablet Oral Daily  ? pantoprazole (PROTONIX) IV  40 mg Intravenous QHS  ? thiamine  100 mg Oral Daily  ? Or  ? thiamine  100 mg Intravenous Daily  ? ziprasidone  10 mg  Intramuscular Once  ? ?Continuous Infusions: ? 0.9 % NaCl with KCl 20 mEq / L 125 mL/hr at 06/08/21 1150  ? ampicillin-sulbactam (UNASYN) IV 3 g (06/08/21 1233)  ? dexmedetomidine (PRECEDEX) IV infusion Stopped (06/07/21 1820)  ? ?PRN Meds:.chlordiazePOXIDE, EPINEPHrine, hydrALAZINE, HYDROmorphone (DILAUDID) injection, hydrOXYzine, methocarbamol, [DISCONTINUED] ondansetron **OR** ondansetron (ZOFRAN) IV, oxyCODONE, simethicone, sodium chloride, traZODone ? ? ?Anti-infectives (From admission, onward)  ? ? Start     Dose/Rate Route Frequency Ordered Stop  ? 06/06/21 1700  Ampicillin-Sulbactam (UNASYN) 3 g in sodium chloride 0.9 % 100 mL IVPB        ? 3 g ?200 mL/hr over 30 Minutes Intravenous Every 6 hours 06/06/21 1521    ? 06/06/21 1430  amoxicillin (AMOXIL) capsule 500 mg       ? 500 mg Oral  Once 06/06/21 1228 06/06/21 1342  ? 06/02/21 1400  ciprofloxacin (CIPRO) IVPB 400 mg  Status:  Discontinued       ? 400 mg ?200 mL/hr over 60 Minutes Intravenous Every 12 hours 06/02/21 0137 06/06/21 1521  ? 06/02/21 1400  metroNIDAZOLE (FLAGYL) IVPB 500 mg  Status:  Discontinued       ? 500 mg ?100 mL/hr over 60 Minutes Intravenous Every 12 hours 06/02/21 0138 06/06/21 1521  ? 06/02/21 0600  aztreonam (AZACTAM) 2 g in sodium chloride 0.9 % 100 mL IVPB  Status:  Discontinued       ? 2 g ?200 mL/hr over 30 Minutes Intravenous Every 8 hours 06/02/21 0310 06/02/21 1005  ? 06/02/21 0145  aztreonam (AZACTAM) 2 g in sodium chloride 0.9 % 100 mL IVPB  Status:  Discontinued       ? 2 g ?200 mL/hr over 30 Minutes Intravenous  Once 06/02/21 0137 06/02/21 0137  ? 06/02/21 0145  metroNIDAZOLE (FLAGYL) IVPB 500 mg  Status:  Discontinued       ? 500 mg ?100 mL/hr over 60 Minutes Intravenous Every 12 hours 06/02/21 0137 06/02/21 0138  ? 06/02/21 0115  ciprofloxacin (CIPRO) IVPB 400 mg       ? 400 mg ?200 mL/hr over 60 Minutes Intravenous  Once 06/02/21 0105 06/02/21 0244  ? 06/02/21 0115  metroNIDAZOLE (FLAGYL) IVPB 500 mg       ? 500 mg ?100 mL/hr over 60 Minutes Intravenous  Once 06/02/21 0105 06/02/21 0244  ? ?  ? ?  ? ?Subjective: ?Clifford Cancel today has no fevers, no emesis,  No chest pain,   ?-Started on oral intake today ? ? ?Objective: ?Vitals:  ? 06/08/21 1000 06/08/21 1100 06/08/21 1124 06/08/21 1600  ?BP:      ?Pulse: 92 90 98   ?Resp: 16 20 (!) 22   ?Temp:  98.5 ?F (36.9 ?C)  98.6 ?F (37 ?C)  ?TempSrc:  Oral  Oral  ?SpO2: 98% 97% 98%   ?Weight:      ?Height:      ? ? ?Intake/Output Summary (Last 24 hours) at 06/08/2021 1718 ?Last data filed at 06/08/2021 1245 ?Gross per 24 hour  ?Intake 2773.13 ml  ?Output 1122 ml  ?Net 1651.13 ml  ? ?Filed Weights  ? 06/06/21 0500  06/07/21 0500 06/08/21 0500  ?Weight: 63.7 kg 63.8 kg 63.2 kg  ? ? ?Physical Exam ? ?Gen:- Awake Alert,   ?HEENT:- [place].AT, No sclera icterus ?Neck-Supple Neck,No JVD,.  ?Lungs-  CTAB , fair symmetrical air movement ?CV- S1, S2 normal, regular  ?Abd-  +ve B.Sounds, Abd Soft, appropriate tenderness, ostomy bag with liquid contents ?Extremity/Skin:-  No  edema, pedal pulses present  ?Psych-affect is appropriate, oriented x3 ?Neuro-no new focal deficits, no tremors ? ?Data Reviewed: I have personally reviewed following labs and imaging studies ? ?CBC: ?Recent Labs  ?Lab 06/04/21 ?0411 06/05/21 ?0433 06/06/21 ?0310 06/07/21 ?2023 06/08/21 ?0414  ?WBC 16.6* 13.2* 11.3* 10.6* 11.3*  ?NEUTROABS 15.0* 10.8* 8.6* 7.2 7.6  ?HGB 15.2 14.5 13.8 13.9 14.8  ?HCT 46.1 44.4 41.8 42.1 43.5  ?MCV 100.0 99.8 98.8 98.1 97.1  ?PLT 218 243 236 251 321  ? ?Basic Metabolic Panel: ?Recent Labs  ?Lab 06/03/21 ?0431 06/04/21 ?0411 06/05/21 ?0433 06/06/21 ?0310 06/07/21 ?3435 06/08/21 ?0414  ?NA 134* 134* 137 135 137 137  ?K 4.3 3.9 3.7 3.2* 3.5 3.4*  ?CL 103 104 104 102 106 107  ?CO2 22 22 25 24 24 22   ?GLUCOSE 105* 131* 100* 113* 92 96  ?BUN 9 9 10 8 8 7   ?CREATININE 0.68 0.62 0.63 0.59* 0.59* 0.57*  ?CALCIUM 8.5* 7.8* 7.9* 7.8* 7.9* 7.7*  ?MG 1.8 1.8 1.9  --   --  1.8  ?PHOS 2.4* 2.6 1.5* 2.5  --   --   ? ?GFR: ?Estimated Creatinine Clearance: 96.6 mL/min (A) (by C-G formula based on SCr of 0.57 mg/dL (L)). ?Liver Function Tests: ?Recent Labs  ?Lab 06/01/21 ?2131 06/07/21 ?0939  ?AST 14* 17  ?ALT 13 12  ?ALKPHOS 57 33*  ?BILITOT 1.8* 0.7  ?PROT 7.6 4.9*  ?ALBUMIN 4.3 2.4*  ? ?Cardiac Enzymes: ?No results for input(s): CKTOTAL, CKMB, CKMBINDEX, TROPONINI in the last 168 hours. ?BNP (last 3 results) ?No results for input(s): PROBNP in the last 8760 hours. ?HbA1C: ?No results for input(s): HGBA1C in the last 72 hours. ?Sepsis Labs: ?@LABRCNTIP (procalcitonin:4,lacticidven:4) ?) ?Recent Results (from the past 240 hour(s))  ?Resp Panel by RT-PCR  (Flu A&B, Covid) Nasopharyngeal Swab     Status: None  ? Collection Time: 06/02/21  1:24 AM  ? Specimen: Nasopharyngeal Swab; Nasopharyngeal(NP) swabs in vial transport medium  ?Result Value Ref Range Status  ?

## 2021-06-08 NOTE — Discharge Instructions (Addendum)
-1)Follow - up with Dr. Kara Mead or Dr Christinia Gully Pulmonologist in [place] about 6 weeks for outpatient PET scan due to concerns for lung cancer ?-Address: [address] [place], [place] [address] ?Phone: [phone] OR call [phone] ?-Patient needs to be seen in the [place] office, Not in [place] ? ?2) please follow-up with general surgeon Dr. Constance Haw as advised ? ?3) lifting and activity restrictions as advised by general surgeon ? ?4) complete abstinence from alcohol and tobacco strongly encouraged--- please consider outpatient or inpatient alcohol rehab programs ? ? ?Discharge Open Abdominal Surgery Instructions: ? ?Common Complaints: ?Pain at the incision site is common. This will improve with time. Take your pain medications as described below. ?Some nausea is common and poor appetite. The main goal is to stay hydrated the first few days after surgery.  ? ?Diet/ Activity: ?Diet as tolerated. You have started and tolerated a diet in the hospital, and should continue to increase what you are able to eat.   ?You may not have a large appetite, but it is important to stay hydrated. Drink 64 ounces of water a day. Your appetite will return with time.  ?Keep a dry dressing in place over your staples daily or as needed. Some minor pink/ blood tinged drainage is expected. This will stop in a few days after surgery.  ?Shower per your regular routine daily.  Do not take hot showers. Take warm showers that are less than 10 minutes. Path the incision dry. ?Wear an abdominal binder daily with activity. You do not have to wear this while sleeping or sitting.  ?Rest and listen to your body, but do not remain in bed all day.  ?Walk everyday for at least 15-20 minutes. Deep cough and move around every 1-2 hours in the first few days after surgery.  ?Do not lift > 10 lbs, perform excessive bending, pushing, pulling, squatting for 6-8 weeks after surgery.  ?The activity restrictions and the abdominal binder  are to prevent hernia formation at your incision while you are healing.  ?Do not place lotions or balms on your incision unless instructed to specifically by Dr. Constance Haw.  ? ?Pain Expectations and Narcotics: ?-After surgery you will have pain associated with your incisions and this is normal. The pain is muscular and nerve pain, and will get better with time. ?-You are encouraged and expected to take non narcotic medications like tylenol and ibuprofen (when able) to treat pain as multiple modalities can aid with pain treatment. ?-Narcotics are only used when pain is severe or there is breakthrough pain. ?-You are not expected to have a pain score of 0 after surgery, as we cannot prevent pain. A pain score of 3-4 that allows you to be functional, move, walk, and tolerate some activity is the goal. The pain will continue to improve over the days after surgery and is dependent on your surgery. ?-Due to [place] law, we are only able to give a certain amount of pain medication to treat post operative pain, and we only give additional narcotics on a patient by patient basis.  ?-For most laparoscopic surgery, studies have shown that the majority of patients only need 10-15 narcotic pills, and for open surgeries most patients only need 15-20.   ?-Having appropriate expectations of pain and knowledge of pain management with non narcotics is important as we do not want anyone to become addicted to narcotic pain medication.  ?-Using ice packs in the first 48 hours and heating pads after 48 hours,  wearing an abdominal binder (when recommended), and using over the counter medications are all ways to help with pain management.   ?-Simple acts like meditation and mindfulness practices after surgery can also help with pain control and research has proven the benefit of these practices. ? ?Medication: ?Take tylenol and ibuprofen as needed for pain control, alternating every 4-6 hours.  ?Example:  ?Tylenol 1000mg  @ 6am, 12noon, 6pm,  25midnight (Do not exceed 4000mg  of tylenol a day). Ibuprofen 800mg  @ 9am, 3pm, 9pm, 3am (Do not exceed 3600mg  of ibuprofen a day).  ?Take Roxicodone for breakthrough pain every 4 hours.  ?Take Colace for constipation related to narcotic pain medication. If you do not have a bowel movement in 2 days, take Miralax over the counter.  ?Drink plenty of water to also prevent constipation.  ? ?Contact Information: ?If you have questions or concerns, please call our office, [phone], Monday- Thursday 8AM-5PM and Friday 8AM-12Noon.  ?If it is after hours or on the weekend, please call [facility], [phone], [phone], and ask to speak to the surgeon on call for Dr. Constance Haw at [facility].  ? ?

## 2021-06-08 NOTE — Progress Notes (Signed)
Patient OTF at this time for CT  ?

## 2021-06-09 LAB — RENAL FUNCTION PANEL
Albumin: 2.8 g/dL — ABNORMAL LOW (ref 3.5–5.0)
Anion gap: 8 (ref 5–15)
BUN: 5 mg/dL — ABNORMAL LOW (ref 6–20)
CO2: 25 mmol/L (ref 22–32)
Calcium: 8.3 mg/dL — ABNORMAL LOW (ref 8.9–10.3)
Chloride: 102 mmol/L (ref 98–111)
Creatinine, Ser: 0.62 mg/dL (ref 0.61–1.24)
GFR, Estimated: >60 mL/min (ref >60)
Glucose, Bld: 95 mg/dL (ref 70–99)
Phosphorus: 2.8 mg/dL (ref 2.5–4.6)
Potassium: 3.9 mmol/L (ref 3.5–5.1)
Sodium: 135 mmol/L (ref 135–145)

## 2021-06-09 LAB — GLUCOSE, CAPILLARY
Glucose-Capillary: 106 mg/dL — ABNORMAL HIGH (ref 70–99)
Glucose-Capillary: 86 mg/dL (ref 70–99)

## 2021-06-09 MED ORDER — PANTOPRAZOLE SODIUM 40 MG PO TBEC: 40.0000 mg | DELAYED_RELEASE_TABLET | Freq: Every day | ORAL | 3 refills | Status: DC

## 2021-06-09 MED ORDER — AMLODIPINE BESYLATE 10 MG PO TABS: 10.0000 mg | ORAL_TABLET | Freq: Every day | ORAL | 4 refills | Status: DC

## 2021-06-09 MED ORDER — ADULT MULTIVITAMIN W/MINERALS CH: 1.0000 | ORAL_TABLET | Freq: Every day | ORAL | 3 refills | Status: DC

## 2021-06-09 MED ORDER — OXYCODONE HCL 5 MG PO TABS: 5.0000 mg | ORAL_TABLET | ORAL | 0 refills | Status: DC | PRN

## 2021-06-09 MED ORDER — LABETALOL HCL 200 MG PO TABS
200.0000 mg | ORAL_TABLET | Freq: Two times a day (BID) | ORAL | 3 refills | Status: DC
Start: 2021-06-09 — End: 2021-10-20

## 2021-06-09 MED ORDER — AMOXICILLIN-POT CLAVULANATE 875-125 MG PO TABS: 1.0000 | ORAL_TABLET | Freq: Two times a day (BID) | ORAL | 0 refills | Status: AC

## 2021-06-09 MED ORDER — TRAZODONE HCL 50 MG PO TABS: 50.0000 mg | ORAL_TABLET | Freq: Every day | ORAL | 4 refills | Status: DC

## 2021-06-09 MED ORDER — HYDROXYZINE HCL 25 MG PO TABS: 25.0000 mg | ORAL_TABLET | Freq: Four times a day (QID) | ORAL | 0 refills | Status: DC | PRN

## 2021-06-09 NOTE — Discharge Summary (Signed)
?                                                                                ? ? ?Dakota Bryant, is a 51 y.o. male  DOB 04/03/1970  MRN [id number]. ? ?Admission date:  06/01/2021  Admitting Physician  Christel Mormon, MD ? ?Discharge Date:  06/09/2021  ? ?Primary MD  Chipper Herb, MD (Inactive) ? ?Recommendations for primary care physician for things to follow:  ? ?-1)Follow - up with Dr. Kara Mead or Dr Christinia Gully Pulmonologist in [place] about 6 weeks for outpatient PET scan due to concerns for lung cancer ?-Address: [address], [place],  [address] ?Phone: [phone] OR call [phone] ?-Patient needs to be seen in the [place] office, Not in [place] ? ?2) please follow-up with general surgeon Dr. Constance Haw as advised ? ?3) lifting and activity restrictions as advised by general surgeon ? ?4) complete abstinence from alcohol and tobacco strongly encouraged--- please consider outpatient or inpatient alcohol rehab programs ? ?Admission Diagnosis  Diverticulitis of colon with perforation [K57.20] ?Perforated abdominal viscus [R19.8] ? ? ?Discharge Diagnosis  Diverticulitis of colon with perforation [K57.20] ?Perforated abdominal viscus [R19.8]   ? ?Principal Problem: ?  Acute diverticulitis ?Active Problems: ?  Perforated sigmoid colon ([place]) ?  Sepsis ([place]) ?  Alcohol abuse with withdrawal ([place]) ?  Hypertensive urgency ?  GERD without esophagitis ?  Solitary pulmonary nodule on lung CT ?    ? ?History reviewed. No pertinent past medical history. ? ?Past Surgical History:  ?Procedure Laterality Date  ? COLOSTOMY N/A 06/03/2021  ? Procedure: SIGMOID COLECTOMY WITH COLOSTOMY;  Surgeon: Virl Cagey, MD;  Location: [facility];  Service: General;  Laterality: N/A;  ? HAND TENDON SURGERY    ? KNEE ARTHROSCOPY Left   ? ? ? HPI  from the history and physical done on the day of admission:  ? ?Dakota Bryant is a 51 y.o. male with medical history significant for hypertension, GERD and ongoing  tobacco abuse, who presented to the ER with acute onset of worsening abdominal pain over the last 3 days with no nausea or vomiting.  He has been constipated and had a watery bowel movement after using the laxative.  He admitted to tactile fever and chills with diaphoresis.  He denied any chest pain or dyspnea or cough or wheezing.  He has been having diminished urine output.  No dysuria or hematuria, urgency or frequency or flank pain. ? ?ED Course: When he came to the ER, BP was 142/102 with a heart rate of 138 and otherwise normal vital signs.  CMP was remarkable for a sodium of 134, CO2 21 and total bili 1.8, with a lactic acid of 0.9 and CBC showed significant leukocytosis 20.6 with hemoconcentration with hemoglobin 18.9  and hematocrit 55 point.  Influenza antigens and COVID-19 PCR came back negative ?  ?Imaging: Abdominal pelvic CT scan with contrast revealed the following: ?1. Acute perforated sigmoid diverticulitis.  No abscess. ?2. Indeterminate and partially visualized 2.5 x 1.5 cm left ?infrahilar nodule, concerning for neoplasm. Dedicated chest CT is ?recommended for better evaluation. ?3. A 1 cm nonobstructing left renal inferior  pole calculus. No ?hydronephrosis. ?4. Aortic Atherosclerosis. ? ?Dr. Constance Haw was contacted and is aware about the patient.  She will take him on her service in AM.  The patient was given 1 mg of IV Dilaudid, 4 mg of IV Zofran, 1 L bolus of IV lactated Ringer and IV Cipro and Flagyl.  The patient will be admitted to a stepdown unit bed for further evaluation and management. ?  ? ? ? ? Hospital Course:  ? ?Assessment and Plan: ? ?1)Sepsis secondary to acute sigmoid diverticulitis ?-s/p sigmoid colectomy with colostomy on 06/03/2021 for diverticulitis with perforation ?-Treated with Unasyn, will discharge on Augmentin ?-Tolerating solid foods, having ostomy output ?-Ostomy wound care RN consult appreciated ?-Patient is comfortable with ostomy care and changes at this time ?  ?   ?2)Left Lung Mass/suspected lung cancer --- CT chest with contrast showed Left lower lobe solid infrahilar mass measuring up to 3.0 cm, concerning for primary lung malignancy ?-Pulmonology consult from Dr. Melvyn Novas appreciated ?-PCCM recommends outpatient PET scan and PFTs in 6 weeks after recovery from surgery ?-Patient has close to 40 pack years ?-May be a candidate for surgery/segmentectomy ?-May be a candidate for CT navigational biopsy ?-Patient is aware of the need to follow-up as outpatient due to concern for very very high likelihood of malignancy ?-Patient verbalizes understanding of the need for outpatient pulmonology follow-up for further possible malignancy work-up ?  ?3)HTN-- ?-Continue labetalol 200 twice daily ?-Weaned off IV Cardene drip ?-discharge on Amlodipine ? ?  ?4)Alcohol abuse with withdrawal ([place]) ?-Successfully weaned off Precedex drip  ?Treated with Librium detox protocol ?-Cessation counseling provided ?Multivitamin advised ?-Outpatient or inpatient alcohol rehab program advised ?  ?5)GERD without esophagitis ?- Continue PPI. ?  ?6) tobacco abuse/COPD--- treated with bronchodilators as needed ?-Smoking cessation advised ?  ?Disposition--discharge home ?  ?Disposition: The patient is from: Home ?             Anticipated d/c is to: Home ?             ?Code Status :  -  Code Status: Full Code  ?  ?Family Communication:    NA (patient is alert, awake and coherent)  ? ?Discharge Condition: stable ? ?Follow UP ? ? Follow-up Information   ? ? Virl Cagey, MD Follow up on 06/16/2021.   ?Specialty: General Surgery ?Why: ostomy check ?Contact information: ?[address] ?[place] [address] ?[phone] ? ? ?  ?  ? ? Tanda Rockers, MD. Schedule an appointment as soon as possible for a visit in 6 week(s).   ?Specialty: Pulmonary Disease ?Why: outpatient PET scan due to concerns for lung cancer ?Contact information: ?[address] ?[address] ?[place] [address] ?[phone] ? ? ?  ?   ? ?  ?  ? ?  ?  ? ?Consults obtained --General surgery and pulmonology ? ?Diet and Activity recommendation:  As advised ? ?Discharge Instructions   ? ?Discharge Instructions   ? ? Call MD for:  difficulty breathing, headache or visual disturbances   Complete by: As directed ?  ? Call MD for:  persistant dizziness or light-headedness   Complete by: As directed ?  ? Call MD for:  persistant nausea and vomiting   Complete by: As directed ?  ? Call MD for:  redness, tenderness, or signs of infection (pain, swelling, redness, odor or green/yellow discharge around incision site)   Complete by: As directed ?  ? Call MD for:  temperature >  100.4   Complete by: As directed ?  ? Diet - low sodium heart healthy   Complete by: As directed ?  ? Discharge instructions   Complete by: As directed ?  ? 1)Follow - up with Dr. Kara Mead or Dr Christinia Gully Pulmonologist in [place] about 6 weeks for outpatient PET scan due to concerns for lung cancer ?-Address: [address] [place], [place] [address] ?Phone: [phone] OR call [phone] ?-Patient needs to be seen in the [place] office, Not in [place] ? ?2) please follow-up with general surgeon Dr. Constance Haw as advised ? ?3) lifting and activity restrictions as advised by general surgeon ? ?4) complete abstinence from alcohol and tobacco strongly encouraged--- please consider outpatient or inpatient alcohol rehab programs  ? Discharge wound care:   Complete by: As directed ?  ? Wound and ostomy care as advised by general surgeon  ? Increase activity slowly   Complete by: As directed ?  ? ?  ? ? ? ? Discharge Medications  ? ?  ?Allergies as of 06/09/2021   ?No Known Allergies ?  ? ?  ?Medication List  ?  ? ?STOP taking these medications   ? ?esomeprazole 20 MG capsule ?Commonly known as: [place] ?  ?ibuprofen 200 MG tablet ?Commonly known as: ADVIL ?  ? ?  ? ?TAKE these medications   ? ?amLODipine 10 MG tablet ?Commonly known as: NORVASC ?Take 1 tablet (10 mg total) by  mouth daily. ?Start taking on: June 10, 2021 ?  ?amoxicillin-clavulanate 875-125 MG tablet ?Commonly known as: Augmentin ?Take 1 tablet by mouth 2 (two) times daily for 5 days. ?  ?hydrOXYzine 25 MG tablet ?Com

## 2021-06-09 NOTE — Progress Notes (Addendum)
I was present with the medical student for this service. I personally verified the history of present illness, performed the physical exam, and made the plan for this encounter. I have verified the medical student's documentation and made modifications where appropriately. I have personally documented in my own words a brief history, physical, and plan below.    ? ?Eating, able to change ostomy and pack wound. Ready for dc home. ? ?Ostomy with stool ?Midline packing and no cellulitis ?Augmentin until 3/27 ?Roxicodone for pain ?Supplies for wound and ostomy home with him ? ?Future Appointments  ?Date Time Provider [place]  ?06/16/2021  2:30 PM Virl Cagey, MD RS-RS None  ? ? ?Curlene Labrum, MD ?[facility] ?[place], [place] [address] ?[phone] (office) ? ? ?[facility] Progress Note ? ?6 Days Post-Op  ?Subjective: ?Dakota Bryant is a 51 y.o. 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 He states that he feels well today and was able to tolerate full liquids and soft diet without bloating, nausea, vomiting. He has been urinating and passing some gas. He denies chest pain, shortness of breath, leg pain, leg swelling.  ? ?Objective: ?Vital signs in last 24 hours: ?Temp:  [97.9 ?F (36.6 ?C)-98.6 ?F (37 ?C)] 98.4 ?F (36.9 ?C) (03/23 0272) ?Pulse Rate:  [89-100] 98 (03/23 0552) ?Resp:  [10-22] 16 (03/23 0552) ?BP: (159-168)/(101-118) 168/101 (03/23 0552) ?SpO2:  [97 %-99 %] 97 % (03/23 0552) ?Weight:  [61.8 kg] 61.8 kg (03/23 0552) ?Last BM Date : 06/08/21 ? ?Intake/Output from previous day: ?03/22 0701 - 03/23 0700 ?In: 1581.8 [P.O.:1125; IV Piggyback:456.8] ?Out: 1270 [Urine:520; Stool:750] ?Intake/Output this shift: ?No intake/output data recorded. ? ?Constitutional: well appearing and sitting in bed, in no acute distress ?Cardiovascular: regular rate and rhythm, no  m/r/g ?Pulmonary/Chest: normal work of breathing on room air ?Abdominal: soft, mild tenderness over surgical scar, otherwise non-tender, non-distended. Ostomy bag with small amount of bowel sweat. No redness or obvious signs of infection around the site. ?Neurological: alert and answering questions appropriately ? ? ?Lab Results:  ?Recent Labs  ?  06/07/21 ?5366 06/08/21 ?0414  ?WBC 10.6* 11.3*  ?HGB 13.9 14.8  ?HCT 42.1 43.5  ?PLT 251 321  ? ?BMET ?Recent Labs  ?  06/08/21 ?0414 06/09/21 ?4403  ?NA 137 135  ?K 3.4* 3.9  ?CL 107 102  ?CO2 22 25  ?GLUCOSE 96 95  ?BUN 7 5*  ?CREATININE 0.57* 0.62  ?CALCIUM 7.7* 8.3*  ? ?PT/INR ?No results for input(s): LABPROT, INR in the last 72 hours. ? ?Studies/Results: ?CT CHEST W CONTRAST ? ?Result Date: 06/08/2021 ?CLINICAL DATA:  Follow-up chest x-ray EXAM: CT CHEST WITH CONTRAST TECHNIQUE: Multidetector CT imaging of the chest was performed during intravenous contrast administration. RADIATION DOSE REDUCTION: This exam was performed according to the departmental dose-optimization program which includes automated exposure control, adjustment of the mA and/or kV according to patient size and/or use of iterative reconstruction technique. CONTRAST:  4mL OMNIPAQUE IOHEXOL 300 MG/ML  SOLN COMPARISON:  CT abdomen and pelvis dated June 03, 2021; chest CT dated May 27, 2018 FINDINGS: Cardiovascular: Normal heart size. No pericardial effusion. Coronary artery calcifications of the LAD. Atherosclerotic disease of the thoracic aorta. Mediastinum/Nodes: Esophagus and thyroid are unremarkable. No pathologically enlarged lymph nodes seen in the chest. Lungs/Pleura: Central airways are patent. Mild centrilobular emphysema. Bilateral central predominant patchy ground-glass opacities, most pronounced in the upper lungs. Left lower lobe solid  mass measuring 2.4 x 1.5 (AP) x 3.0 (sagittal) cm located adjacent to the aorta. Additional basilar linear opacities are seen which are similar to May 27, 2018 prior exam and likely due to scarring. Upper Abdomen: No acute abnormality. Musculoskeletal: No chest wall abnormality. No acute or significant osseous findings. IMPRESSION: 1. Left lower lobe solid infrahilar mass measuring up to 3.0 cm, concerning for primary lung malignancy. Consultation with Pulmonology or Thoracic Surgery recommended. 2. Bilateral central predominant patchy ground-glass opacities, most pronounced in the upper lungs, likely infectious or inflammatory. 3. Mild aortic Atherosclerosis (ICD10-I70.0) and Emphysema (ICD10-J43.9). Electronically Signed   By: Yetta Glassman M.D.   On: 06/08/2021 09:21  ? ?DG Chest Port 1 View ? ?Result Date: 06/07/2021 ?CLINICAL DATA:  Abdominal pain EXAM: PORTABLE CHEST 1 VIEW COMPARISON:  Chest x-ray dated May 27, 2018; CT abdomen and pelvis dated June 03, 2020 FINDINGS: Cardiac and mediastinal contours are within normal limits. Patchy opacities of the mid right lung and lower left lung. Increased density of the right lung apex, possibly related to patient positioning. No large pleural effusion or pneumothorax. IMPRESSION: 1. Patchy opacities of the mid right lung and lower left lung. Recommend dedicated chest CT better evaluation per prior CT reports. 2. Increased density of the right lung apex, possibly related to patient positioning. Recommend attention on follow-up. Electronically Signed   By: Yetta Glassman M.D.   On: 06/07/2021 09:15   ? ?Anti-infectives: ?Anti-infectives (From admission, onward)  ? ? Start     Dose/Rate Route Frequency Ordered Stop  ? 06/06/21 1700  Ampicillin-Sulbactam (UNASYN) 3 g in sodium chloride 0.9 % 100 mL IVPB       ? 3 g ?200 mL/hr over 30 Minutes Intravenous Every 6 hours 06/06/21 1521    ? 06/06/21 1430  amoxicillin (AMOXIL) capsule 500 mg       ? 500 mg Oral  Once 06/06/21 1228 06/06/21 1342  ? 06/02/21 1400  ciprofloxacin (CIPRO) IVPB 400 mg  Status:  Discontinued       ? 400 mg ?200 mL/hr over 60 Minutes Intravenous  Every 12 hours 06/02/21 0137 06/06/21 1521  ? 06/02/21 1400  metroNIDAZOLE (FLAGYL) IVPB 500 mg  Status:  Discontinued       ? 500 mg ?100 mL/hr over 60 Minutes Intravenous Every 12 hours 06/02/21 0138 06/06/21 1521  ? 06/02/21 0600  aztreonam (AZACTAM) 2 g in sodium chloride 0.9 % 100 mL IVPB  Status:  Discontinued       ? 2 g ?200 mL/hr over 30 Minutes Intravenous Every 8 hours 06/02/21 0310 06/02/21 1005  ? 06/02/21 0145  aztreonam (AZACTAM) 2 g in sodium chloride 0.9 % 100 mL IVPB  Status:  Discontinued       ? 2 g ?200 mL/hr over 30 Minutes Intravenous  Once 06/02/21 0137 06/02/21 0137  ? 06/02/21 0145  metroNIDAZOLE (FLAGYL) IVPB 500 mg  Status:  Discontinued       ? 500 mg ?100 mL/hr over 60 Minutes Intravenous Every 12 hours 06/02/21 0137 06/02/21 0138  ? 06/02/21 0115  ciprofloxacin (CIPRO) IVPB 400 mg       ? 400 mg ?200 mL/hr over 60 Minutes Intravenous  Once 06/02/21 0105 06/02/21 0244  ? 06/02/21 0115  metroNIDAZOLE (FLAGYL) IVPB 500 mg       ? 500 mg ?100 mL/hr over 60 Minutes Intravenous  Once 06/02/21 0105 06/02/21 0244  ? ?  ? ? ?Assessment/Plan: ?s/p Procedure(s): ?SIGMOID COLECTOMY WITH COLOSTOMY ? ?-  Advance to full diet ?- Discharge home today if diet is tolerated ?- Transition of care team working to get [facility] ?- Finish antibiotic course with Augmentin at home ?- Will need follow-up with pulmonary specialist for lung mass ?- Hospitalist team managing HTN, withdrawal symptoms ? ? LOS: 7 days  ? ? ?Elane Fritz ?06/09/2021  ?

## 2021-06-13 ENCOUNTER — Telehealth (INDEPENDENT_AMBULATORY_CARE_PROVIDER_SITE_OTHER): Payer: Self-pay | Admitting: *Deleted

## 2021-06-13 DIAGNOSIS — K631 Perforation of intestine (nontraumatic): Secondary | ICD-10-CM

## 2021-06-13 MED ORDER — OXYCODONE HCL 5 MG PO TABS: 5.0000 mg | ORAL_TABLET | ORAL | 0 refills | Status: DC | PRN

## 2021-06-13 NOTE — Telephone Encounter (Signed)
[facility] ? ?Needs to be taking ibuprofen 800 mg q 6 hours and tylenol 1000 mg q 6 hours. I have increased roxicodone to 5-10 mg q 4 PRN. ? ?Curlene Labrum, MD ?[facility] ?[place], [place] [address] ?[phone] (office) ? ?

## 2021-06-13 NOTE — Telephone Encounter (Signed)
Received call from patient [phone]~ telephone. ? ?Surgical Date: 06/03/2021 ?Procedure: SIGMOID COLECTOMY WITH COLOSTOMY ? ?Patient reports that his current pain medication. States that he is in increased pain and is requesting higher dose and strength.  ? ?Patient advised to alternate IBU and APAP as well.  ? ?Pain Management: Oxycodone 5mg  PO Q 4hrs PRN, #15 tabs- last refilled 06/09/2021. ? ?Allergies: NKDA ?Pharmacy: [facility] ?

## 2021-06-14 ENCOUNTER — Telehealth: Payer: Self-pay | Admitting: *Deleted

## 2021-06-14 NOTE — Telephone Encounter (Signed)
Received call from Otila Kluver [facility] with [facility] [phone]~ telephone.  ? ?Reports that patient reports possible UTI, increased urgency and frequency, small stream of urine, and difficulty beginning stream.  ? ?Requested orders.  ? ?Call placed to [facility] to advise to place call to PCP, or to have patient seen at UC/ ER for possible UTI.  ?

## 2021-06-14 NOTE — Telephone Encounter (Signed)
Call placed to patient. LMTRC.  

## 2021-06-15 ENCOUNTER — Other Ambulatory Visit: Payer: Self-pay | Admitting: Family Medicine

## 2021-06-15 ENCOUNTER — Encounter: Payer: Self-pay | Admitting: *Deleted

## 2021-06-15 ENCOUNTER — Other Ambulatory Visit: Payer: Self-pay | Admitting: *Deleted

## 2021-06-15 DIAGNOSIS — R350 Frequency of micturition: Secondary | ICD-10-CM

## 2021-06-15 MED ORDER — TAMSULOSIN HCL 0.4 MG PO CAPS: 0.4000 mg | ORAL_CAPSULE | Freq: Every day | ORAL | 0 refills | Status: DC

## 2021-06-15 NOTE — Addendum Note (Signed)
Addended by: Sheral Flow on: 06/15/2021 02:27 PM ? ? Modules accepted: Orders ? ?

## 2021-06-15 NOTE — Telephone Encounter (Signed)
Call placed to patient. [place].  ? ?Orders placed for UA C&S. ?

## 2021-06-15 NOTE — Addendum Note (Signed)
Addended by: Shary Decamp B on: 06/15/2021 04:04 PM ? ? Modules accepted: Orders ? ?

## 2021-06-15 NOTE — Telephone Encounter (Signed)
This encounter was created in error - please disregard.

## 2021-06-15 NOTE — Telephone Encounter (Signed)
Received new orders from Dr. Constance Haw to follow up with patient to determine if he has been seen or treated by another provider. New orders obtained for UA C&S. Also orders given for Tamsulosin 0.4mg  for low stream. ? ?Call placed to patient. States that he has difficulty starting stream, and does not feel as if he is emptying bladder completely. States that he also has increased flank and lower back pain.  ? ?Patient agreeable to coming to lab to leave urine sample. Prescription sent to pharmacy for Tamsulosin.  ?

## 2021-06-16 ENCOUNTER — Other Ambulatory Visit: Payer: Self-pay

## 2021-06-16 ENCOUNTER — Ambulatory Visit (INDEPENDENT_AMBULATORY_CARE_PROVIDER_SITE_OTHER): Payer: Self-pay | Admitting: General Surgery

## 2021-06-16 ENCOUNTER — Encounter: Payer: Self-pay | Admitting: General Surgery

## 2021-06-16 VITALS — BP 109/73 | HR 87 | Temp 98.7°F | Resp 16 | Ht 66.0 in | Wt 125.0 lb

## 2021-06-16 DIAGNOSIS — K631 Perforation of intestine (nontraumatic): Secondary | ICD-10-CM

## 2021-06-16 DIAGNOSIS — R350 Frequency of micturition: Secondary | ICD-10-CM

## 2021-06-16 LAB — URINALYSIS, ROUTINE W REFLEX MICROSCOPIC
Bilirubin, UA: NEGATIVE
Glucose, UA: NEGATIVE
Ketones, UA: NEGATIVE
Leukocytes,UA: NEGATIVE
Nitrite, UA: NEGATIVE
Protein,UA: NEGATIVE
RBC, UA: NEGATIVE
Specific Gravity, UA: 1.019 (ref 1.005–1.030)
Urobilinogen, Ur: 0.2 mg/dL (ref 0.2–1.0)
pH, UA: 5.5 (ref 5.0–7.5)

## 2021-06-16 MED ORDER — HYDROXYZINE HCL 25 MG PO TABS: 25.0000 mg | ORAL_TABLET | Freq: Four times a day (QID) | ORAL | 0 refills | Status: DC | PRN

## 2021-06-16 NOTE — Patient Instructions (Signed)
Continue ostomy care. Continue to pack wound daily. Can dampen with saline to get out and to place since this packing is drier.  ? ?Try to wean from the roxicodone. Will plan to refill again on Monday.  ? ?Colace 100mg  twice daily as needed for constipation.  ?

## 2021-06-16 NOTE — Progress Notes (Signed)
[facility] ? ?Doing good. Having some pain and needing narcotics. Cannot take ibuprofen due to ulcer history. ? ?BP 109/73   Pulse 87   Temp 98.7 ?F (37.1 ?C) (Oral)   Resp 16   Ht 5\' 6"  (1.676 m)   Wt 125 lb (56.7 kg)   SpO2 95%   BMI 20.18 kg/m?  ?Ostomy pink with stool ?Midline healing, staples removed on upper portion ,packing replaced, no erythema or drainage  ? ? ?Future Appointments  ?Date Time Provider [place]  ?07/05/2021 10:30 AM Virl Cagey, MD RS-RS None  ? ?Patient s/p colostomy for perforated diverticulitis. Healing.  ? ?Continue ostomy care. Continue to pack wound daily. Can dampen with saline to get out and to place since this packing is drier.  ? ?Try to wean from the roxicodone. Will plan to refill again on Monday.  ? ?Colace 100mg  twice daily as needed for constipation.  ? ?Future Appointments  ?Date Time Provider [place]  ?07/05/2021 10:30 AM Virl Cagey, MD RS-RS None  ? ?Curlene Labrum, MD ?[facility] ?[place], [place] [address] ?[phone] (office) ? ?

## 2021-06-19 LAB — URINE CULTURE: Organism ID, Bacteria: NO GROWTH

## 2021-06-20 ENCOUNTER — Other Ambulatory Visit: Payer: Self-pay | Admitting: *Deleted

## 2021-06-20 ENCOUNTER — Telehealth (INDEPENDENT_AMBULATORY_CARE_PROVIDER_SITE_OTHER): Payer: Self-pay | Admitting: General Surgery

## 2021-06-20 DIAGNOSIS — R918 Other nonspecific abnormal finding of lung field: Secondary | ICD-10-CM

## 2021-06-20 DIAGNOSIS — K631 Perforation of intestine (nontraumatic): Secondary | ICD-10-CM

## 2021-06-20 MED ORDER — OXYCODONE HCL 5 MG PO TABS: 5.0000 mg | ORAL_TABLET | ORAL | 0 refills | Status: DC | PRN

## 2021-06-20 NOTE — Telephone Encounter (Signed)
[facility] ? ?Patient had needed another pain prescription, waited until the appropriate number of days to send in. ? ?I am out next week, so patient needs to be sure to ration his medication and if needed my partners can prescribe. ? ?Curlene Labrum, MD ?[facility] ?[place], [place] [address] ?[phone] (office) ? ?

## 2021-07-05 ENCOUNTER — Encounter: Payer: Self-pay | Admitting: General Surgery

## 2021-07-05 ENCOUNTER — Ambulatory Visit (INDEPENDENT_AMBULATORY_CARE_PROVIDER_SITE_OTHER): Payer: Self-pay | Admitting: General Surgery

## 2021-07-05 VITALS — BP 101/69 | HR 82 | Temp 98.3°F | Resp 12 | Ht 66.0 in | Wt 124.0 lb

## 2021-07-05 DIAGNOSIS — K631 Perforation of intestine (nontraumatic): Secondary | ICD-10-CM

## 2021-07-05 DIAGNOSIS — R918 Other nonspecific abnormal finding of lung field: Secondary | ICD-10-CM

## 2021-07-05 MED ORDER — OXYCODONE HCL 5 MG PO TABS: 5.0000 mg | ORAL_TABLET | ORAL | 0 refills | Status: DC | PRN

## 2021-07-05 NOTE — Progress Notes (Signed)
[facility] ? ?Ostomy doing well and bag not leaking. Changes bag about every other day. Wafer stays on longer. Midline healed. Eating more. ? ?BP 101/69   Pulse 82   Temp 98.3 ?F (36.8 ?C) (Oral)   Resp 12   Ht 5\' 6"  (1.676 m)   Wt 124 lb (56.2 kg)   SpO2 98%   BMI 20.01 kg/m?  ?Ostomy pink with gas in bag ?Midline healed ? ?Patient s/p colostomy for diverticulitis. Doing better.  ? ?Follow up with Pulmonary about the lung nodule. ?Will refer you to Gi for a colonoscopy in the next few months. ? ?No heavy lifting > 10 lbs  6-8 weeks after surgery. Ok to start using machinery or working with these restrictions and off narcotics.  ? ?Contact [facility] Regarding any help with out of pocket supplies. [phone] ? ?Will see in a month. Will plan to try to get reversed pending his pulmonary and GI evaluations in late June (3 months out). ? ?Future Appointments  ?Date Time Provider [place]  ?07/12/2021  2:00 PM Wert, Christena Deem, MD [facility] None  ?08/09/2021  9:15 AM Virl Cagey, MD RS-RS None  ? ?Curlene Labrum, MD ?[facility] ?[place], [place] [address] ?[phone] (office) ? ?

## 2021-07-05 NOTE — Patient Instructions (Signed)
Follow up with Pulmonary about the lung nodule. ?Will refer you to Gi for a colonoscopy in the next few months. ? ?No heavy lifting > 10 lbs  6-8 weeks after surgery. Ok to start using machinery or working with these restrictions and off narcotics.  ? ?Contact [facility] Regarding any help with out of pocket supplies. [phone] ? ? ?

## 2021-07-07 ENCOUNTER — Encounter (INDEPENDENT_AMBULATORY_CARE_PROVIDER_SITE_OTHER): Payer: Self-pay | Admitting: *Deleted

## 2021-07-12 ENCOUNTER — Other Ambulatory Visit: Payer: Self-pay

## 2021-07-12 ENCOUNTER — Encounter: Payer: Self-pay | Admitting: Pulmonary Disease

## 2021-07-12 ENCOUNTER — Institutional Professional Consult (permissible substitution): Payer: Self-pay | Admitting: Internal Medicine

## 2021-07-12 ENCOUNTER — Ambulatory Visit (INDEPENDENT_AMBULATORY_CARE_PROVIDER_SITE_OTHER): Payer: Self-pay | Admitting: Pulmonary Disease

## 2021-07-12 VITALS — BP 132/64 | HR 89 | Ht 66.0 in | Wt 126.0 lb

## 2021-07-12 DIAGNOSIS — R918 Other nonspecific abnormal finding of lung field: Secondary | ICD-10-CM

## 2021-07-12 DIAGNOSIS — J432 Centrilobular emphysema: Secondary | ICD-10-CM | POA: Insufficient documentation

## 2021-07-12 MED ORDER — ANORO ELLIPTA 62.5-25 MCG/ACT IN AEPB
1.0000 | INHALATION_SPRAY | Freq: Every day | RESPIRATORY_TRACT | 5 refills | Status: DC
  Filled 2021-07-12: qty 60, 30d supply, fill #0

## 2021-07-12 MED ORDER — SPIRIVA RESPIMAT 2.5 MCG/ACT IN AERS
2.0000 | INHALATION_SPRAY | Freq: Every day | RESPIRATORY_TRACT | 5 refills | Status: DC
  Filled 2021-07-12: qty 4, 30d supply, fill #0

## 2021-07-12 MED ORDER — SPIRIVA RESPIMAT 2.5 MCG/ACT IN AERS: 2.0000 | INHALATION_SPRAY | Freq: Every day | RESPIRATORY_TRACT | 0 refills | Status: DC

## 2021-07-12 NOTE — Patient Instructions (Addendum)
Left lung mass ?ORDER PET CT with Super D protocol ?Discuss with Dr. Valeta Harms regarding navigational bronchoscopy +/- thoracic surgery ? ?Emphysema ?Provide samples of Spiriva 2.5 mcg TWO puffs ONCE a day ?Refer to [facility] for assistance ? ?Tobacco abuse ?Patient is an active smoker. ?We discussed smoking cessation for 3 minutes. We discussed triggers and stressors and ways to deal with them. We discussed barriers to continued smoking and benefits of smoking cessation. Provided patient with information cessation techniques and interventions including [place] quitline. ? ?Follow-up with me in 1 month ?

## 2021-07-12 NOTE — Addendum Note (Signed)
Addended by: Rodman Pickle on: 07/12/2021 04:17 PM ? ? Modules accepted: Orders ? ?

## 2021-07-12 NOTE — Progress Notes (Signed)
? ? ?Subjective:  ? ?PATIENT ID: Dakota Bryant GENDER: male DOB: 29-Nov-1970, MRN: [id number] ? ? ?HPI ? ?Chief Complaint  ?Patient presents with  ? Consult  ?  Left lung mass  ? ? ?Reason for Visit: New consult for lung mass ? ?Ms. Dakota Bryant is a 51 year old active smoker with HTN, reflux, s/p colostomy for diverticulitis who presents as a new consult for lung mass. ? ?He was recently hospitalized for abdominal pain and found with diverticulitis with perforation requiring colostomy. ?He has reports hot flashes that resolved after his surgery. Found with incidental left lung mass on CT. Seen by Pulmonary consults with Dr. Melvyn Novas with plan for outpatient care. ? ?Reports chronic cough, usually dry and occurs in the morning. Denies wheezing. Some SOB with exertion however does not limit his activity. Able to walk without stopping. Can climb flight of stairs. Denies hemoptysis, weight loss. ? 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 ?I have personally reviewed patient's past medical/family/social history, allergies, current medications. ? ?Past Medical History:  ?Diagnosis Date  ? Acid reflux   ? Colostomy in [facility])   ? Emphysema lung ([place])   ? Hypertension   ? Mass of left lung   ? Perforated diverticulum   ?  ? ?History reviewed. No pertinent family history.  ? ?Social History  ? ?Occupational History  ? Not on file  ?Tobacco Use  ? Smoking status: Every Day  ?  Packs/day: 1.00  ?  Years: 1.00  ?  Pack years: 1.00  ?  Types: Cigarettes  ? Smokeless tobacco: Never  ? Tobacco comments:  ?  Started smoking pack a day since 14  ?Vaping Use  ? Vaping Use: Never used  ?Substance and Sexual Activity  ? Alcohol use: Yes  ?  Alcohol/week: 2.0 standard drinks  ?  Types: 2 Cans of beer per week  ?  Comment: nighlty   ? Drug use: Yes  ?  Types: Marijuana  ?  Comment: occ  ? Sexual activity: Not on file  ? ? ?No Known  Allergies  ? ?Outpatient Medications Prior to Visit  ?Medication Sig Dispense Refill  ? amLODipine (NORVASC) 10 MG tablet Take 1 tablet (10 mg total) by mouth daily. 30 tablet 4  ? hydrOXYzine (ATARAX) 25 MG tablet Take 1 tablet (25 mg total) by mouth every 6 (six) hours as needed for anxiety or nausea. 30 tablet 0  ? labetalol (NORMODYNE) 200 MG tablet Take 1 tablet (200 mg total) by mouth 2 (two) times daily. 60 tablet 3  ? Multiple Vitamin (MULTIVITAMIN WITH MINERALS) TABS tablet Take 1 tablet by mouth daily. 120 tablet 3  ? oxyCODONE (ROXICODONE) 5 MG immediate release tablet Take 1 tablet (5 mg total) by mouth every 4 (four) hours as needed for severe pain or breakthrough pain. No alcohol while taking this 20 tablet 0  ? pantoprazole (PROTONIX) 40 MG tablet Take 1 tablet (40 mg total) by mouth daily. 30 tablet 3  ? tamsulosin (FLOMAX) 0.4 MG CAPS capsule Take 1 capsule (0.4 mg total) by mouth daily. 30 capsule 0  ? traZODone (DESYREL) 50 MG tablet Take 1 tablet (50 mg total) by mouth at bedtime. 30 tablet 4  ? ?No facility-administered medications prior to visit.  ? ? ?Review of Systems  ?Constitutional:  Negative for chills, diaphoresis, fever, malaise/fatigue and weight loss.  ?HENT:  Negative  for congestion.   ?Respiratory:  Positive for cough and shortness of breath. Negative for hemoptysis, sputum production and wheezing.   ?Cardiovascular:  Negative for chest pain, palpitations and leg swelling.  ? ? ?Objective:  ? ?Vitals:  ? 07/12/21 1430  ?BP: 132/64  ?Pulse: 89  ?SpO2: 98%  ?Weight: 126 lb (57.2 kg)  ?Height: 5\' 6"  (1.676 m)  ? ?SpO2: 98 % ?O2 Device: None (Room air) ? ?Physical Exam: ?General: Well-appearing, no acute distress ?HENT: [place], AT ?Eyes: EOMI, no scleral icterus ?Respiratory: Clear to auscultation bilaterally.  No crackles, wheezing or rales ?Cardiovascular: RRR, -M/R/G, no JVD ?Extremities:-Edema,-tenderness ?Neuro: AAO x4, CNII-XII grossly intact ?Psych: Normal mood, normal affect ? ?Data  Reviewed: ? ?Imaging: ?CT Chest 05/27/2018 - No mediastinal adenopathy. Mild centrilobular emphysema. Bilateral ground glass nodularity. Splenic laceration. ?CT Chest 06/08/21 - Left lower lobe infrahilar mass measuring 2.2 x 1.5 cm, adjacent to aorta. Mild emphysema. Bilateral patchy ground glass opacities in the upper lungs ? ?PFT: ?None on file ? ?Labs: ?CBC ?   ?Component Value Date/Time  ? WBC 11.3 (H) 06/08/2021 0414  ? RBC 4.48 06/08/2021 0414  ? HGB 14.8 06/08/2021 0414  ? HCT 43.5 06/08/2021 0414  ? PLT 321 06/08/2021 0414  ? MCV 97.1 06/08/2021 0414  ? MCH 33.0 06/08/2021 0414  ? MCHC 34.0 06/08/2021 0414  ? RDW 12.7 06/08/2021 0414  ? LYMPHSABS 2.2 06/08/2021 0414  ? MONOABS 0.9 06/08/2021 0414  ? EOSABS 0.3 06/08/2021 0414  ? BASOSABS 0.1 06/08/2021 0414  ? ?Absolute eos 06/08/21 -300 ? ?   ?Assessment & Plan:  ? ?Discussion: ?51 year old active smoker with HTN, reflux, s/p colostomy for diverticulitis who presents as a new consult for lung mass. ?Based on imaging, lung mass is highly concerning for malignancy. We discussed diagnostic testing including CT-guided biopsy, bronchoscopy and surgery vs conservative management with further imaging. After addressing patient/family's questions, patient wishes to pursue diagnostic testing via bronchoscopy if appropriate. We discussed risks and benefits of procedure including infection, bleeding and lung collapse. Will start with PET/CT to rule out mets. Discussed tobacco cessation with nicotine patches, lozenges and gum ? ?Left lung mass ?ORDER PET CT with Super D protocol as soon as possible ?Discuss with Dr. Valeta Harms regarding navigational bronchoscopy +/- thoracic surgery ? ?Emphysema ?ORDER PFTs ?Provide samples of Spiriva 2.5 mcg TWO puffs ONCE a day ?Refer to [facility] for assistance ? ?Tobacco abuse ?Patient is an active smoker. ?We discussed smoking cessation for 3 minutes. We discussed triggers and stressors and ways to deal with them. We  discussed barriers to continued smoking and benefits of smoking cessation. Provided patient with information cessation techniques and interventions including  quitline. ? ? ?Health Maintenance ?Immunization History  ?Administered Date(s) Administered  ? Td 06/26/2015  ? ?CT Lung Screen - not qualified due to abnormal CT and age ? ?Orders Placed This Encounter  ?Procedures  ? Pulmonary function test  ?  Standing Status:   Future  ?  Standing Expiration Date:   07/13/2022  ?  Order Specific Question:   Where should this test be performed?  ?  Answer:   [facility]  ?  Order Specific Question:   Full PFT: includes the following: basic spirometry, spirometry pre & post bronchodilator, diffusion capacity (DLCO), lung volumes  ?  Answer:   Full PFT  ?No orders of the defined types were placed in this encounter. ? ? ?Return in about 1 month (around 08/11/2021). ? ?I have  spent a total time of 50-minutes on the day of the appointment reviewing prior documentation, coordinating care and discussing medical diagnosis and plan with the patient/family. Imaging, labs and tests included in this note have been reviewed and interpreted independently by me. ? ?Abb Gobert Rodman Pickle, MD ?[facility] ?07/12/2021 3:20 PM  ?Office Number [phone] ? ? ?

## 2021-07-18 ENCOUNTER — Encounter (HOSPITAL_COMMUNITY)
Admission: RE | Admit: 2021-07-18 | Discharge: 2021-07-18 | Disposition: A | Discharge status: Home / Self Care | Payer: Medicaid Other | Source: Ambulatory Visit | Attending: Pulmonary Disease | Admitting: Pulmonary Disease

## 2021-07-18 ENCOUNTER — Telehealth: Payer: Self-pay | Admitting: Pulmonary Disease

## 2021-07-18 DIAGNOSIS — R918 Other nonspecific abnormal finding of lung field: Secondary | ICD-10-CM | POA: Diagnosis not present

## 2021-07-18 LAB — GLUCOSE, CAPILLARY: Glucose-Capillary: 100 mg/dL — ABNORMAL HIGH (ref 70–99)

## 2021-07-18 MED ORDER — FLUDEOXYGLUCOSE F - 18 (FDG) INJECTION
6.3000 | Freq: Once | INTRAVENOUS | Status: AC
  Administered 2021-07-18: 6.3 via INTRAVENOUS

## 2021-07-18 NOTE — Telephone Encounter (Signed)
Please schedule patient for video visit with me tomorrow (5/2) to discuss PET imaging ?

## 2021-07-19 ENCOUNTER — Telehealth (INDEPENDENT_AMBULATORY_CARE_PROVIDER_SITE_OTHER): Payer: Self-pay | Admitting: Pulmonary Disease

## 2021-07-19 DIAGNOSIS — R918 Other nonspecific abnormal finding of lung field: Secondary | ICD-10-CM

## 2021-07-19 NOTE — Progress Notes (Signed)
? ?Virtual Visit via Video Note ? ?I connected with Dakota Bryant on 07/20/21 at  9:15 AM EDT by a video enabled telemedicine application and verified that I am speaking with the correct person using two identifiers. ? ?Location: ?Patient: Home ?Provider: [facility] ?  ?I discussed the limitations of evaluation and management by telemedicine and the availability of in person appointments. The patient expressed understanding and agreed to proceed. ?  ?I discussed the assessment and treatment plan with the patient. The patient was provided an opportunity to ask questions and all were answered. The patient agreed with the plan and demonstrated an understanding of the instructions. ?  ?The patient was advised to call back or seek an in-person evaluation if the symptoms worsen or if the condition fails to improve as anticipated. ? ?Video connection was lost when less than 50% of the duration of the visit was complete, at which time the remainder of the visit was completed via audio only. ? ?I provided 50 minutes of non-face-to-face time during this encounter. ? ? ?Dakota Mochizuki Rodman Pickle, MD ? ?Subjective:  ? ?PATIENT ID: Dakota Bryant GENDER: male DOB: 05-15-1970, MRN: [id number] ? ? ?HPI ? ?Chief Complaint  ?Patient presents with  ? Follow-up  ?  PET scan results  ? ? ?Reason for Visit: Follow-up ? ?Dakota Bryant is a 51 year old active smoker with HTN, reflux, s/p colostomy for diverticulitis who presents for follow-up for lung mass ? ?Initial consult ?He was recently hospitalized for abdominal pain and found with diverticulitis with perforation requiring colostomy. He has reports hot flashes that resolved after his surgery. Found with incidental left lung mass on CT. Seen by Pulmonary consults with Dr. Melvyn Novas with plan for outpatient care. ?Reports chronic cough, usually dry and occurs in the morning. Denies wheezing. Some SOB with exertion however does not limit his activity. Able to walk without  stopping. Can climb flight of stairs. Denies hemoptysis, weight loss. ? ?07/19/21 ?His symptoms of cough and shortness of breaeth are unchanged since our last visit. He is here today to review his CT scan and discuss next steps for his care. Continues to smoke. ? 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 ?I have personally reviewed patient's past medical/family/social history, allergies, current medications. ? ?Past Medical History:  ?Diagnosis Date  ? Acid reflux   ? Colostomy in place [facility])   ? Emphysema lung ([place])   ? Hypertension   ? Mass of left lung   ? Perforated diverticulum   ?  ? ?No family history on file.  ? ?Social History  ? ?Occupational History  ? Not on file  ?Tobacco Use  ? Smoking status: Every Day  ?  Packs/day: 1.00  ?  Years: 1.00  ?  Pack years: 1.00  ?  Types: Cigarettes  ? Smokeless tobacco: Never  ? Tobacco comments:  ?  Started smoking pack a day since 14  ?Vaping Use  ? Vaping Use: Never used  ?Substance and Sexual Activity  ? Alcohol use: Yes  ?  Alcohol/week: 2.0 standard drinks  ?  Types: 2 Cans of beer per week  ?  Comment: nighlty   ? Drug use: Yes  ?  Types: Marijuana  ?  Comment: occ  ? Sexual activity: Not on file  ? ? ?No Known Allergies  ? ?Outpatient Medications Prior to Visit  ?Medication Sig Dispense  Refill  ? amLODipine (NORVASC) 10 MG tablet Take 1 tablet (10 mg total) by mouth daily. 30 tablet 4  ? hydrOXYzine (ATARAX) 25 MG tablet Take 1 tablet (25 mg total) by mouth every 6 (six) hours as needed for anxiety or nausea. 30 tablet 0  ? labetalol (NORMODYNE) 200 MG tablet Take 1 tablet (200 mg total) by mouth 2 (two) times daily. 60 tablet 3  ? Multiple Vitamin (MULTIVITAMIN WITH MINERALS) TABS tablet Take 1 tablet by mouth daily. 120 tablet 3  ? tamsulosin (FLOMAX) 0.4 MG CAPS capsule Take 1 capsule (0.4 mg total) by mouth daily. 30 capsule 0  ? traZODone (DESYREL) 50 MG tablet  Take 1 tablet (50 mg total) by mouth at bedtime. 30 tablet 4  ? umeclidinium-vilanterol (ANORO ELLIPTA) 62.5-25 MCG/ACT AEPB Inhale 1 puff into the lungs daily. 60 each 5  ? oxyCODONE (ROXICODONE) 5 MG immediate release tablet Take 1 tablet (5 mg total) by mouth every 4 (four) hours as needed for severe pain or breakthrough pain. No alcohol while taking this (Patient not taking: Reported on 07/19/2021) 20 tablet 0  ? pantoprazole (PROTONIX) 40 MG tablet Take 1 tablet (40 mg total) by mouth daily. (Patient not taking: Reported on 07/19/2021) 30 tablet 3  ? ?No facility-administered medications prior to visit.  ? ? ?Review of Systems  ?Constitutional:  Negative for chills, diaphoresis, fever, malaise/fatigue and weight loss.  ?HENT:  Negative for congestion.   ?Respiratory:  Positive for cough and shortness of breath. Negative for hemoptysis, sputum production and wheezing.   ?Cardiovascular:  Negative for chest pain, palpitations and leg swelling.  ? ? ?Objective:  ? ?There were no vitals filed for this visit. ? ?  ? ?Physical Exam: ?General: No distress noted on audio exam.  ? ? ?Data Reviewed: ? ?Imaging: ?CT Chest 05/27/2018 - No mediastinal adenopathy. Mild centrilobular emphysema. Bilateral ground glass nodularity. Splenic laceration. ?CT Chest 06/08/21 - Left lower lobe infrahilar mass measuring 2.2 x 1.5 cm, adjacent to aorta. Mild emphysema. Bilateral patchy ground glass opacities in the upper lungs ?PET/CT 05/19/47 - Hypermetabolic LLL mass with contralateral mediastinal and hilar adenopathy. 85mm RUL nodule ? ?PFT: ?None on file ? ?Labs: ?CBC ?   ?Component Value Date/Time  ? WBC 11.3 (H) 06/08/2021 0414  ? RBC 4.48 06/08/2021 0414  ? HGB 14.8 06/08/2021 0414  ? HCT 43.5 06/08/2021 0414  ? PLT 321 06/08/2021 0414  ? MCV 97.1 06/08/2021 0414  ? MCH 33.0 06/08/2021 0414  ? MCHC 34.0 06/08/2021 0414  ? RDW 12.7 06/08/2021 0414  ? LYMPHSABS 2.2 06/08/2021 0414  ? MONOABS 0.9 06/08/2021 0414  ? EOSABS 0.3 06/08/2021 0414   ? BASOSABS 0.1 06/08/2021 0414  ? ?Absolute eos 06/08/21 -300 ? ?   ?Assessment & Plan:  ? ?Discussion: ?51 year old active smoker with HTN, reflux, s/p colostomy for diverticulitis who presents for follow-up. Reviewed PET/CT with patient. Based on imaging, lung mass is highly concerning for malignancy with contralateral mediastinal adenopathy. May potentially represent infection. We discussed diagnostic testing including CT-guided biopsy, bronchoscopy and surgery. After addressing patient/family's questions, patient wishes to pursue diagnostic testing via bronchoscopy. We discussed risks and benefits of procedure including infection, bleeding and lung collapse. Patient consented to procedure. Coordinated procedure with RN and OR scheduler. ? ?Left lung mass with mediastinal adenopathy ?ARRANGE for EBUS ? ?Emphysema ?PFTs scheduled ?CONTINUE Anoro  ? ?Tobacco abuse ?Patient is an active smoker. ?Encouraged cessation ? ? ?Health Maintenance ?Immunization History  ?Administered Date(s)  Administered  ? Td 06/26/2015  ? ?CT Lung Screen - not qualified due to abnormal CT and age ? ?Orders Placed This Encounter  ?Procedures  ? Ambulatory referral to Pulmonology  ?  Referral Priority:   Urgent  ?  Referral Type:   Consultation  ?  Referral Reason:   Specialty Services Required  ?  Requested Specialty:   Pulmonary Disease  ?  Number of Visits Requested:   1  ?No orders of the defined types were placed in this encounter. ? ? ?No follow-ups on file. ? ?I have spent a total time of 50-minutes on the day of the appointment including chart review, data review, collecting history, coordinating care and discussing medical diagnosis and plan with the patient/family. Past medical history, allergies, medications were reviewed. Pertinent imaging, labs and tests included in this note have been reviewed and interpreted independently by me. ? ?Dakota Aamodt Rodman Pickle, MD ?[facility] ?07/19/2021 9:25 AM  ?Office Number  [phone] ? ? ?

## 2021-07-19 NOTE — H&P (View-Only) (Signed)
? ?Virtual Visit via Video Note ? ?I connected with Dakota Bryant on 07/20/21 at  9:15 AM EDT by a video enabled telemedicine application and verified that I am speaking with the correct person using two identifiers. ? ?Location: ?Patient: Home ?Provider: [facility] ?  ?I discussed the limitations of evaluation and management by telemedicine and the availability of in person appointments. The patient expressed understanding and agreed to proceed. ?  ?I discussed the assessment and treatment plan with the patient. The patient was provided an opportunity to ask questions and all were answered. The patient agreed with the plan and demonstrated an understanding of the instructions. ?  ?The patient was advised to call back or seek an in-person evaluation if the symptoms worsen or if the condition fails to improve as anticipated. ? ?Video connection was lost when less than 50% of the duration of the visit was complete, at which time the remainder of the visit was completed via audio only. ? ?I provided 50 minutes of non-face-to-face time during this encounter. ? ? ?Nechuma Boven Rodman Pickle, MD ? ?Subjective:  ? ?PATIENT ID: Dakota Bryant GENDER: male DOB: 13-Nov-1970, MRN: [id number] ? ? ?HPI ? ?Chief Complaint  ?Patient presents with  ? Follow-up  ?  PET scan results  ? ? ?Reason for Visit: Follow-up ? ?[name] is a 51 year old active smoker with HTN, reflux, s/p colostomy for diverticulitis who presents for follow-up for lung mass ? ?Initial consult ?He was recently hospitalized for abdominal pain and found with diverticulitis with perforation requiring colostomy. He has reports hot flashes that resolved after his surgery. Found with incidental left lung mass on CT. Seen by Pulmonary consults with Dr. Melvyn Novas with plan for outpatient care. ?Reports chronic cough, usually dry and occurs in the morning. Denies wheezing. Some SOB with exertion however does not limit his activity. Able to walk without  stopping. Can climb flight of stairs. Denies hemoptysis, weight loss. ? ?07/19/21 ?His symptoms of cough and shortness of breaeth are unchanged since our last visit. He is here today to review his CT scan and discuss next steps for his care. Continues to smoke. ? 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 ?I have personally reviewed patient's past medical/family/social history, allergies, current medications. ? ?Past Medical History:  ?Diagnosis Date  ? Acid reflux   ? Colostomy in place [facility])   ? Emphysema lung ([place])   ? Hypertension   ? Mass of left lung   ? Perforated diverticulum   ?  ? ?No family history on file.  ? ?Social History  ? ?Occupational History  ? Not on file  ?Tobacco Use  ? Smoking status: Every Day  ?  Packs/day: 1.00  ?  Years: 1.00  ?  Pack years: 1.00  ?  Types: Cigarettes  ? Smokeless tobacco: Never  ? Tobacco comments:  ?  Started smoking pack a day since 14  ?Vaping Use  ? Vaping Use: Never used  ?Substance and Sexual Activity  ? Alcohol use: Yes  ?  Alcohol/week: 2.0 standard drinks  ?  Types: 2 Cans of beer per week  ?  Comment: nighlty   ? Drug use: Yes  ?  Types: Marijuana  ?  Comment: occ  ? Sexual activity: Not on file  ? ? ?No Known Allergies  ? ?Outpatient Medications Prior to Visit  ?Medication Sig Dispense  Refill  ? amLODipine (NORVASC) 10 MG tablet Take 1 tablet (10 mg total) by mouth daily. 30 tablet 4  ? hydrOXYzine (ATARAX) 25 MG tablet Take 1 tablet (25 mg total) by mouth every 6 (six) hours as needed for anxiety or nausea. 30 tablet 0  ? labetalol (NORMODYNE) 200 MG tablet Take 1 tablet (200 mg total) by mouth 2 (two) times daily. 60 tablet 3  ? Multiple Vitamin (MULTIVITAMIN WITH MINERALS) TABS tablet Take 1 tablet by mouth daily. 120 tablet 3  ? tamsulosin (FLOMAX) 0.4 MG CAPS capsule Take 1 capsule (0.4 mg total) by mouth daily. 30 capsule 0  ? traZODone (DESYREL) 50 MG tablet  Take 1 tablet (50 mg total) by mouth at bedtime. 30 tablet 4  ? umeclidinium-vilanterol (ANORO ELLIPTA) 62.5-25 MCG/ACT AEPB Inhale 1 puff into the lungs daily. 60 each 5  ? oxyCODONE (ROXICODONE) 5 MG immediate release tablet Take 1 tablet (5 mg total) by mouth every 4 (four) hours as needed for severe pain or breakthrough pain. No alcohol while taking this (Patient not taking: Reported on 07/19/2021) 20 tablet 0  ? pantoprazole (PROTONIX) 40 MG tablet Take 1 tablet (40 mg total) by mouth daily. (Patient not taking: Reported on 07/19/2021) 30 tablet 3  ? ?No facility-administered medications prior to visit.  ? ? ?Review of Systems  ?Constitutional:  Negative for chills, diaphoresis, fever, malaise/fatigue and weight loss.  ?HENT:  Negative for congestion.   ?Respiratory:  Positive for cough and shortness of breath. Negative for hemoptysis, sputum production and wheezing.   ?Cardiovascular:  Negative for chest pain, palpitations and leg swelling.  ? ? ?Objective:  ? ?There were no vitals filed for this visit. ? ?  ? ?Physical Exam: ?General: No distress noted on audio exam.  ? ? ?Data Reviewed: ? ?Imaging: ?CT Chest 05/27/2018 - No mediastinal adenopathy. Mild centrilobular emphysema. Bilateral ground glass nodularity. Splenic laceration. ?CT Chest 06/08/21 - Left lower lobe infrahilar mass measuring 2.2 x 1.5 cm, adjacent to aorta. Mild emphysema. Bilateral patchy ground glass opacities in the upper lungs ?PET/CT 06/19/30 - Hypermetabolic LLL mass with contralateral mediastinal and hilar adenopathy. 53mm RUL nodule ? ?PFT: ?None on file ? ?Labs: ?CBC ?   ?Component Value Date/Time  ? WBC 11.3 (H) 06/08/2021 0414  ? RBC 4.48 06/08/2021 0414  ? HGB 14.8 06/08/2021 0414  ? HCT 43.5 06/08/2021 0414  ? PLT 321 06/08/2021 0414  ? MCV 97.1 06/08/2021 0414  ? MCH 33.0 06/08/2021 0414  ? MCHC 34.0 06/08/2021 0414  ? RDW 12.7 06/08/2021 0414  ? LYMPHSABS 2.2 06/08/2021 0414  ? MONOABS 0.9 06/08/2021 0414  ? EOSABS 0.3 06/08/2021 0414   ? BASOSABS 0.1 06/08/2021 0414  ? ?Absolute eos 06/08/21 -300 ? ?   ?Assessment & Plan:  ? ?Discussion: ?51 year old active smoker with HTN, reflux, s/p colostomy for diverticulitis who presents for follow-up. Reviewed PET/CT with patient. Based on imaging, lung mass is highly concerning for malignancy with contralateral mediastinal adenopathy. May potentially represent infection. We discussed diagnostic testing including CT-guided biopsy, bronchoscopy and surgery. After addressing patient/family's questions, patient wishes to pursue diagnostic testing via bronchoscopy. We discussed risks and benefits of procedure including infection, bleeding and lung collapse. Patient consented to procedure. Coordinated procedure with RN and OR scheduler. ? ?Left lung mass with mediastinal adenopathy ?ARRANGE for EBUS ? ?Emphysema ?PFTs scheduled ?CONTINUE Anoro  ? ?Tobacco abuse ?Patient is an active smoker. ?Encouraged cessation ? ? ?Health Maintenance ?Immunization History  ?Administered Date(s)  Administered  ? Td 06/26/2015  ? ?CT Lung Screen - not qualified due to abnormal CT and age ? ?Orders Placed This Encounter  ?Procedures  ? Ambulatory referral to Pulmonology  ?  Referral Priority:   Urgent  ?  Referral Type:   Consultation  ?  Referral Reason:   Specialty Services Required  ?  Requested Specialty:   Pulmonary Disease  ?  Number of Visits Requested:   1  ?No orders of the defined types were placed in this encounter. ? ? ?No follow-ups on file. ? ?I have spent a total time of 50-minutes on the day of the appointment including chart review, data review, collecting history, coordinating care and discussing medical diagnosis and plan with the patient/family. Past medical history, allergies, medications were reviewed. Pertinent imaging, labs and tests included in this note have been reviewed and interpreted independently by me. ? ?Hind Chesler Rodman Pickle, MD ?[facility] ?07/19/2021 9:25 AM  ?Office Number  [phone] ? ? ?

## 2021-07-20 ENCOUNTER — Encounter: Payer: Self-pay | Admitting: Pulmonary Disease

## 2021-07-22 ENCOUNTER — Other Ambulatory Visit: Payer: Self-pay | Admitting: Pulmonary Disease

## 2021-07-22 LAB — SARS CORONAVIRUS 2 (TAT 6-24 HRS): SARS Coronavirus 2: NEGATIVE

## 2021-07-25 ENCOUNTER — Encounter (INDEPENDENT_AMBULATORY_CARE_PROVIDER_SITE_OTHER): Payer: Self-pay | Admitting: Gastroenterology

## 2021-07-25 ENCOUNTER — Ambulatory Visit (INDEPENDENT_AMBULATORY_CARE_PROVIDER_SITE_OTHER): Payer: Self-pay | Admitting: Gastroenterology

## 2021-07-25 VITALS — BP 103/66 | HR 74 | Temp 98.5°F | Ht 66.0 in | Wt 126.6 lb

## 2021-07-25 DIAGNOSIS — Z9049 Acquired absence of other specified parts of digestive tract: Secondary | ICD-10-CM

## 2021-07-25 DIAGNOSIS — R1310 Dysphagia, unspecified: Secondary | ICD-10-CM

## 2021-07-25 DIAGNOSIS — K5732 Diverticulitis of large intestine without perforation or abscess without bleeding: Secondary | ICD-10-CM

## 2021-07-25 DIAGNOSIS — K631 Perforation of intestine (nontraumatic): Secondary | ICD-10-CM

## 2021-07-25 MED ORDER — OMEPRAZOLE 40 MG PO CPDR: 40.0000 mg | DELAYED_RELEASE_CAPSULE | Freq: Every day | ORAL | 1 refills | Status: DC

## 2021-07-25 NOTE — Progress Notes (Signed)
Referring Provider: Lucretia Roers, MD Primary Care Physician:  Pcp, No Primary GI Physician: new  Chief Complaint  Patient presents with   Diverticulitis    Abdominal pain on left side and lower mid abdomen every time he eats and last a couple of hours, nausea, gas pains, belching sometimes for 3 hours after eating.  sometimes feels as he will pass out due to the gas pain.   HPI:   Dakota Bryant is a 51 y.o. male with past medical history of acid reflux, PUD  Patient presenting today as a new patient for acute diverticulitis, complicated by abscess and perforation.  Patient presented to ED on 3/16 for abdominal pain x2 days with loose stools, had CT A/P with contrast at that time which showed  Acute perforated sigmoid diverticulitis.  No abscess. Started on cipro and flagyl, surgery consulted. Initially planned abx with observation but patient began having worsening abd pain, Patient underwent procedure for sigmoid colectomy, end colostomy on 06/03/21  Today he states,  that he has always had a lot of stomach issues and gas for many years, in march prior to going to the ED, he started having a very sharp pain in mid to left lower quadrant, this previously would occur every 2-3 weeks, pain become much more constant, causing him to proceed to the ED. He denies any associated vomiting or diarrhea with the pain, endorses some constipation at that time. he has a lot of gas and continued LLQ pain around his colostomy.  States that stool output appears normal, denies blood in stools or black stools. States that appetite is not good, he feels that he does not get hungry very often. Reports issues with swallowing as foods get stuck almost daily when eating. Denies dysphagia with liquids. States that sometimes he has such bad gas pains that he has to pat his back and rub his abdomen and can belch, pain will usually improve after this. Denies any upper abdominal pain. States weight has been stable.  Denies any vomiting.   Notably, he is undergoing further evaluation of mass in lower lobe of left lung, discovered initially on CT A/P during ED visit for diverticulitis, noted to be hypermetabolic mass with contralateral mediastinal and right hilar associated adenopathy, also with 5mm R upper Lobe nodule. He is having endobrachial [place] today and may require surgical intervention, depending on findings.   NSAID use: none Social hx: etoh occasionally, smokes 1 PPD  Fam hx: unsure of history, father had a colostomy and thinks mother may have had CRC  Last Colonoscopy:had colonoscopy, maybe 10 years ago, had some polyps, unsure where this was done Last Endoscopy:never  Past Medical History:  Diagnosis Date   Acid reflux    Colostomy in place [facility])    Emphysema lung (HCC)    Hypertension    Mass of left lung    Perforated diverticulum     Past Surgical History:  Procedure Laterality Date   COLOSTOMY N/A 06/03/2021   Procedure: SIGMOID COLECTOMY WITH COLOSTOMY;  Surgeon: Lucretia Roers, MD;  Location: [facility];  Service: General;  Laterality: N/A;   HAND TENDON SURGERY     KNEE ARTHROSCOPY Left     Current Outpatient Medications  Medication Sig Dispense Refill   acetaminophen (TYLENOL) 500 MG tablet Take 1,000 mg by mouth every 8 (eight) hours as needed for headache or moderate pain.     amLODipine (NORVASC) 10 MG tablet Take 1 tablet (10 mg total) by mouth daily.  30 tablet 4   esomeprazole (NEXIUM) 20 MG capsule Take 20 mg by mouth every evening.     hydrOXYzine (ATARAX) 25 MG tablet Take 1 tablet (25 mg total) by mouth every 6 (six) hours as needed for anxiety or nausea. 30 tablet 0   labetalol (NORMODYNE) 200 MG tablet Take 1 tablet (200 mg total) by mouth 2 (two) times daily. 60 tablet 3   Multiple Vitamin (MULTIVITAMIN WITH MINERALS) TABS tablet Take 1 tablet by mouth daily. 120 tablet 3   naphazoline-pheniramine (VISINE) 0.025-0.3 % ophthalmic solution Place 1 drop into both eyes  4 (four) times daily as needed for eye irritation.     oxyCODONE (ROXICODONE) 5 MG immediate release tablet Take 1 tablet (5 mg total) by mouth every 4 (four) hours as needed for severe pain or breakthrough pain. No alcohol while taking this 20 tablet 0   pantoprazole (PROTONIX) 40 MG tablet Take 1 tablet (40 mg total) by mouth daily. (Patient taking differently: Take 40 mg by mouth at bedtime.) 30 tablet 3   tamsulosin (FLOMAX) 0.4 MG CAPS capsule Take 1 capsule (0.4 mg total) by mouth daily. 30 capsule 0   traZODone (DESYREL) 50 MG tablet Take 1 tablet (50 mg total) by mouth at bedtime. 30 tablet 4   umeclidinium-vilanterol (ANORO ELLIPTA) 62.5-25 MCG/ACT AEPB Inhale 1 puff into the lungs daily. (Patient not taking: Reported on 07/25/2021) 60 each 5   No current facility-administered medications for this visit.    Allergies as of 07/25/2021   (No Known Allergies)    No family history on file.  Social History   Socioeconomic History   Marital status: Divorced    Spouse name: Not on file   Number of children: Not on file   Years of education: Not on file   Highest education level: Not on file  Occupational History   Not on file  Tobacco Use   Smoking status: Every Day    Packs/day: 1.00    Years: 1.00    Pack years: 1.00    Types: Cigarettes    Passive exposure: Current   Smokeless tobacco: Never   Tobacco comments:    Started smoking pack a day since 14  Vaping Use   Vaping Use: Never used  Substance and Sexual Activity   Alcohol use: Yes    Alcohol/week: 2.0 standard drinks    Types: 2 Cans of beer per week    Comment: occasional   Drug use: Yes    Types: Marijuana    Comment: occ   Sexual activity: Not on file  Other Topics Concern   Not on file  Social History Narrative   Not on file   Social Determinants of Health   Financial Resource Strain: Not on file  Food Insecurity: Not on file  Transportation Needs: Not on file  Physical Activity: Not on file   Stress: Not on file  Social Connections: Not on file   Review of systems General: negative for malaise, night sweats, fever, chills, weight loss Neck: Negative for lumps, goiter, pain and significant neck swelling Resp: Negative for cough, wheezing, dyspnea at rest CV: Negative for chest pain, leg swelling, palpitations, orthopnea GI: denies melena, hematochezia, nausea, vomiting, diarrhea, constipation, odyonophagia, early satiety or unintentional weight loss. +dysphagia +belching +LLQ pain +decreased appetite MSK: Negative for joint pain or swelling, back pain, and muscle pain. Derm: Negative for itching or rash Psych: Denies depression, anxiety, memory loss, confusion. No homicidal or suicidal ideation.  Heme: Negative  for prolonged bleeding, bruising easily, and swollen nodes. Endocrine: Negative for cold or heat intolerance, polyuria, polydipsia and goiter. Neuro: negative for tremor, gait imbalance, syncope and seizures. The remainder of the review of systems is noncontributory.  Physical Exam: BP 103/66 (BP Location: Left Arm, Patient Position: Sitting, Cuff Size: Normal)   Pulse 74   Temp 98.5 F (36.9 C) (Oral)   Ht 5\' 6"  (1.676 m)   Wt 126 lb 9.6 oz (57.4 kg)   BMI 20.43 kg/m  General:   Alert and oriented. No distress noted. Pleasant and cooperative.  Head:  Normocephalic and atraumatic. Eyes:  Conjuctiva clear without scleral icterus. Mouth:  Oral mucosa pink and moist. Good dentition. No lesions. Heart: Normal rate and rhythm, s1 and s2 heart sounds present.  Lungs: Clear lung sounds in all lobes. Respirations equal and unlabored. Abdomen:  +BS, soft, non-tender and non-distended. No rebound or guarding. No HSM or masses noted. Ostomy present at LLQ with healthy, moist appearing stoma, red in color, no erythema noted around ostomy site Derm: No palmar erythema or jaundice Msk:  Symmetrical without gross deformities. Normal posture. Extremities:  Without  edema. Neurologic:  Alert and  oriented x4 Psych:  Alert and cooperative. Normal mood and affect.  Invalid input(s): 6 MONTHS   ASSESSMENT: Dakota Bryant is a 51 y.o. male presenting today as a new patient for acute, complicated diverticulitis in mid march, with perforation, requiring sigmoid colectomy.   Acute diverticulitis with abscess/perforation: underwent sigmoid colectomy, end colostomy on 06/03/21. Recommended to have colonoscopy prior to ostomy take down. Patient reports normal stool output with no blood or melena, denies fevers or chills. Doing well with ostomy care. Last colonoscopy was approximately 10 years ago. Will proceed with colonoscopy   Dysphagia/belching/decreased appetite: pt with reports of a lot of belching and gas pains, maintained on nexium 20mg  daily, reports dysphagia almost daily with solid foods. Advised patient to stop nexium, will start omeprazole 40mg  daily, discussed reflux precautions to include avoiding greasy, spicy, fried, citrus foods, caffeine, carbonated drinks, chocolate and alcohol. Stay upright 2-3 hours after eating, prior to lying down and avoid eating late in the evenings. Should make sure he is taking small bites, chewing thoroughly and taking sips of liquids between bites to avoid choking. Will proceed with EGD for further evaluation of dysphagia as well cannot rule out esophageal stricture, ring, web, stenosis or malignancy.   Indications, risks and benefits of procedure discussed in detail with patient. Patient verbalized understanding and is in agreement to proceed with EGD/Colonoscopy at this time.    PLAN:  Stop nexium 2. Rx Omeprazole 40mg  daily 3. Schedule EGD/Colonoscopy 4. Reflux precautions 5. Chewing precautions  All questions were answered, patient verbalized understanding and is in agreement with plan as outlined above.   Follow Up: TBD  Yanky Vanderburg L. Jeanmarie Hubert, MSN, APRN, AGNP-C Adult-Gerontology Nurse Practitioner [facility]

## 2021-07-25 NOTE — Patient Instructions (Signed)
Stop nexium ?I have sent omeprazole 40mg  to your pharmacy, please take this 30 minutes prior to eating in the mornings ?We will get you set up for EGD for further evaluation of your swallowing, make sure you are taking small bites, chewing thoroughly and avoiding thicker, dryer foods ?We will get you set up for colonoscopy for further evaluation prior to colostomy takedown ?Please let me know if you have any blood in your stools, black stools, worsening abdominal pain, fevers or chills ? ?

## 2021-07-26 ENCOUNTER — Encounter (HOSPITAL_COMMUNITY): Payer: Self-pay | Admitting: Pulmonary Disease

## 2021-07-26 ENCOUNTER — Ambulatory Visit (HOSPITAL_BASED_OUTPATIENT_CLINIC_OR_DEPARTMENT_OTHER): Payer: Medicaid Other | Admitting: Certified Registered"

## 2021-07-26 ENCOUNTER — Encounter (HOSPITAL_COMMUNITY)
Admission: RE | Disposition: A | Discharge status: Home / Self Care | Payer: Self-pay | Source: Home / Self Care | Attending: Pulmonary Disease

## 2021-07-26 ENCOUNTER — Ambulatory Visit (HOSPITAL_COMMUNITY)
Admission: RE | Admit: 2021-07-26 | Discharge: 2021-07-26 | Disposition: A | Discharge status: Home / Self Care | Payer: Medicaid Other | Attending: Pulmonary Disease | Admitting: Pulmonary Disease

## 2021-07-26 ENCOUNTER — Ambulatory Visit (HOSPITAL_COMMUNITY): Payer: Medicaid Other | Admitting: Certified Registered"

## 2021-07-26 DIAGNOSIS — K219 Gastro-esophageal reflux disease without esophagitis: Secondary | ICD-10-CM | POA: Insufficient documentation

## 2021-07-26 DIAGNOSIS — R918 Other nonspecific abnormal finding of lung field: Secondary | ICD-10-CM | POA: Insufficient documentation

## 2021-07-26 DIAGNOSIS — I10 Essential (primary) hypertension: Secondary | ICD-10-CM

## 2021-07-26 DIAGNOSIS — F17210 Nicotine dependence, cigarettes, uncomplicated: Secondary | ICD-10-CM | POA: Insufficient documentation

## 2021-07-26 DIAGNOSIS — Z79899 Other long term (current) drug therapy: Secondary | ICD-10-CM | POA: Diagnosis not present

## 2021-07-26 DIAGNOSIS — F17200 Nicotine dependence, unspecified, uncomplicated: Secondary | ICD-10-CM | POA: Insufficient documentation

## 2021-07-26 DIAGNOSIS — J449 Chronic obstructive pulmonary disease, unspecified: Secondary | ICD-10-CM

## 2021-07-26 DIAGNOSIS — R590 Localized enlarged lymph nodes: Secondary | ICD-10-CM

## 2021-07-26 DIAGNOSIS — Z933 Colostomy status: Secondary | ICD-10-CM | POA: Insufficient documentation

## 2021-07-26 HISTORY — PX: BRONCHIAL BRUSHINGS: SHX5108

## 2021-07-26 HISTORY — PX: BRONCHIAL WASHINGS: SHX5105

## 2021-07-26 HISTORY — PX: FINE NEEDLE ASPIRATION: SHX6590

## 2021-07-26 HISTORY — PX: ENDOBRONCHIAL ULTRASOUND: SHX5096

## 2021-07-26 HISTORY — PX: VIDEO BRONCHOSCOPY: SHX5072

## 2021-07-26 LAB — BODY FLUID CELL COUNT WITH DIFFERENTIAL
Eos, Fluid: 6 %
Lymphs, Fluid: 31 %
Monocyte-Macrophage-Serous Fluid: 59 % (ref 50–90)
Neutrophil Count, Fluid: 4 % (ref 0–25)
Total Nucleated Cell Count, Fluid: 160 cu mm (ref 0–1000)

## 2021-07-26 SURGERY — ENDOBRONCHIAL ULTRASOUND (EBUS)
Anesthesia: General | Laterality: Bilateral

## 2021-07-26 MED ORDER — SUGAMMADEX SODIUM 200 MG/2ML IV SOLN
INTRAVENOUS | Status: DC | PRN
  Administered 2021-07-26: 150 mg via INTRAVENOUS

## 2021-07-26 MED ORDER — FENTANYL CITRATE (PF) 100 MCG/2ML IJ SOLN
INTRAMUSCULAR | Status: DC | PRN
  Administered 2021-07-26: 100 ug via INTRAVENOUS

## 2021-07-26 MED ORDER — LIDOCAINE 2% (20 MG/ML) 5 ML SYRINGE
INTRAMUSCULAR | Status: DC | PRN
  Administered 2021-07-26: 60 mg via INTRAVENOUS
  Administered 2021-07-26: 40 mg via INTRAVENOUS
  Administered 2021-07-26: 100 mg via INTRAVENOUS

## 2021-07-26 MED ORDER — ONDANSETRON HCL 4 MG/2ML IJ SOLN
INTRAMUSCULAR | Status: DC | PRN
  Administered 2021-07-26: 4 mg via INTRAVENOUS

## 2021-07-26 MED ORDER — FENTANYL CITRATE (PF) 100 MCG/2ML IJ SOLN
INTRAMUSCULAR | Status: AC
  Filled 2021-07-26: qty 2

## 2021-07-26 MED ORDER — PROPOFOL 10 MG/ML IV BOLUS
INTRAVENOUS | Status: DC | PRN
  Administered 2021-07-26: 200 mg via INTRAVENOUS

## 2021-07-26 MED ORDER — ROCURONIUM BROMIDE 10 MG/ML (PF) SYRINGE
PREFILLED_SYRINGE | INTRAVENOUS | Status: DC | PRN
  Administered 2021-07-26: 60 mg via INTRAVENOUS

## 2021-07-26 MED ORDER — MIDAZOLAM HCL 2 MG/2ML IJ SOLN
INTRAMUSCULAR | Status: AC
Start: 2021-07-26 — End: ?
  Filled 2021-07-26: qty 2

## 2021-07-26 MED ORDER — LACTATED RINGERS IV SOLN: INTRAVENOUS | Status: DC

## 2021-07-26 MED ORDER — PROPOFOL 10 MG/ML IV BOLUS
INTRAVENOUS | Status: AC
  Filled 2021-07-26: qty 20

## 2021-07-26 MED ORDER — LACTATED RINGERS IV SOLN
INTRAVENOUS | Status: DC
Start: 2021-07-26 — End: 2021-07-26

## 2021-07-26 MED ORDER — MIDAZOLAM HCL 2 MG/2ML IJ SOLN
INTRAMUSCULAR | Status: DC | PRN
Start: 2021-07-26 — End: 2021-07-26
  Administered 2021-07-26: 2 mg via INTRAVENOUS

## 2021-07-26 MED ORDER — DEXAMETHASONE SODIUM PHOSPHATE 10 MG/ML IJ SOLN
INTRAMUSCULAR | Status: DC | PRN
  Administered 2021-07-26: 10 mg via INTRAVENOUS

## 2021-07-26 NOTE — Anesthesia Preprocedure Evaluation (Signed)
Anesthesia Evaluation  ?Patient identified by MRN, date of birth, ID band ?Patient awake ? ? ? ?Reviewed: ?Allergy & Precautions, NPO status , Patient's Chart, lab work & pertinent test results, reviewed documented beta blocker date and time  ? ?History of Anesthesia Complications ?Negative for: history of anesthetic complications ? ?Airway ?Mallampati: II ? ?TM Distance: >3 FB ?Neck ROM: Full ? ? ? Dental ? ?(+) Dental Advisory Given, Implants, Teeth Intact ?  ?Pulmonary ?COPD, Current Smoker and Patient abstained from smoking.,  ?Lung mass ?  ?Pulmonary exam normal ? ? ? ? ? ? ? Cardiovascular ?hypertension, Pt. on medications and Pt. on home beta blockers ?Normal cardiovascular exam ? ? ?  ?Neuro/Psych ?negative neurological ROS ?   ? GI/Hepatic ?GERD  ,(+)  ?  ? substance abuse ? alcohol use, Sigmoid perforation s/p ex lap, colostomy placement ?  ?Endo/Other  ?negative endocrine ROS ? Renal/GU ?negative Renal ROS  ?negative genitourinary ?  ?Musculoskeletal ?negative musculoskeletal ROS ?(+)  ? Abdominal ?  ?Peds ? Hematology ?negative hematology ROS ?(+)   ?Anesthesia Other Findings ? ? Reproductive/Obstetrics ? ?  ? ? ? ? ? ? ? ? ? ? ? ? ? ?  ?  ? ? ? ? ? ? ?Anesthesia Physical ?Anesthesia Plan ? ?ASA: 3 ? ?Anesthesia Plan: General  ? ?Post-op Pain Management: Minimal or no pain anticipated  ? ?Induction: Intravenous ? ?PONV Risk Score and Plan: 1 and Ondansetron, Dexamethasone, Treatment may vary due to age or medical condition and Midazolam ? ?Airway Management Planned: Oral ETT ? ?Additional Equipment: None ? ?Intra-op Plan:  ? ?Post-operative Plan: Extubation in OR ? ?Informed Consent: I have reviewed the patients History and Physical, chart, labs and discussed the procedure including the risks, benefits and alternatives for the proposed anesthesia with the patient or authorized representative who has indicated his/her understanding and acceptance.  ? ? ? ?Dental advisory  given ? ?Plan Discussed with:  ? ?Anesthesia Plan Comments:   ? ? ? ? ? ? ?Anesthesia Quick Evaluation ? ?

## 2021-07-26 NOTE — Op Note (Signed)
[facility] ?Cardiopulmonary ?Patient Name: Dakota Bryant ?Procedure Date: 07/26/2021 ?MRN: [id number] ?Attending MD: Rodman Pickle , MD ?Date of Birth: 02-Oct-1970 ?CSN: [id number] ?Age: 51 ?Admit Type: Outpatient ?Ethnicity: Not Hispanic or Latino ?Procedure:             Bronchoscopy ?Indications:           Left lower lobe mass, Hilar lymphadenopathy of the  ?                       right side ?Providers:             Rodman Pickle, MD, Lurline Del, RN, Hinton Dyer  ?                       Technician, Technician, Danley Danker, CRNA ?Referring MD:           ?Medicines:             General Anesthesia, None ?Complications:         No immediate complications ?Estimated Blood Loss:  Estimated blood loss: none. ?Procedure: ?     Pre-Anesthesia Assessment: ?     - A History and Physical has been performed. Patient meds and allergies  ?     have been reviewed. The risks and benefits of the procedure and the  ?     sedation options and risks were discussed with the patient. All  ?     questions were answered and informed consent was obtained. Patient  ?     identification and proposed procedure were verified prior to the  ?     procedure by the physician in the pre-procedure area. Mental Status  ?     Examination: normal. Respiratory Examination: clear to auscultation. CV  ?     Examination: RRR, no murmurs, no S3 or S4. ASA Grade Assessment: II - A  ?     patient with mild systemic disease. After reviewing the risks and  ?     benefits, the patient was deemed in satisfactory condition to undergo  ?     the procedure. The anesthesia plan was to use general anesthesia.  ?     Immediately prior to administration of medications, the patient was  ?     re-assessed for adequacy to receive sedatives. The heart rate,  ?     respiratory rate, oxygen saturations, blood pressure, adequacy of  ?     pulmonary ventilation, and response to care were monitored throughout  ?     the procedure. The physical status of the patient  was re-assessed after  ?     the procedure. ?     After obtaining informed consent, the bronchoscope was passed under  ?     direct vision. Throughout the procedure, the patient's blood pressure,  ?     pulse, and oxygen saturations were monitored continuously. the BF-H190  ?     ([id number]) Olympus bronchoscope was introduced through the mouth, via the  ?     endotracheal tube (the patient was intubated for the procedure) and  ?     advanced to the tracheobronchial tree. the Bronchoscope was introduced  ?     through the and advanced to the. The procedure was accomplished without  ?     difficulty. The patient tolerated the procedure well. The total duration  ?     of the procedure was 55  minutes. ?Findings: ?     The endotracheal tube is in good position. The visualized portion of the  ?     trachea is of normal caliber. The carina is sharp. The tracheobronchial  ?     tree was examined to at least the first subsegmental level. Bronchial  ?     mucosa and anatomy are normal; there are no endobronchial lesions, and  ?     no secretions. ?     Brushings of a lesion were obtained in the anterior segment of the right  ?     upper lobe with a cytology brush and sent for routine cytology. ?     Bronchoalveolar lavage was performed in the RUL anterior segment (B3) of  ?     the lung and sent for cell count, bacterial culture, viral smears &  ?     culture, and fungal & AFB analysis and cytology. 120 mL of fluid were  ?     instilled. 35 mL were returned. The return was cloudy. ?     An endobronchial ultrasound endoscope was utilized in order to assist  ?     with guiding the biopsy needle in the entire tracheobronchial tree. ?     Transbronchial needle aspirations of a lesion were performed in the  ?     mediastinum using a Wang 21 gauge needle and sent for routine cytology  ?     as noted below ?     1. Station 4R/right hilar x 7 (with 4 slides). Cell block with 7 rinses  ?     total ?     2. Station 10R x 3 (with 2  slides). Cell block with 3 rinses total ?Impression: ?     - Left lower lobe mass ?     - Hilar lymphadenopathy of the right side ?     - The airway examination was normal. ?     - Brushings were obtained. ?     - Bronchoalveolar lavage was performed. ?     - Endobronchial ultrasound was performed. ?     - A transbronchial needle aspiration was performed. ?Moderate Sedation: ?     General anesthesia ?Recommendation: ?     - Await BAL, biopsy, brushing and cytology results. ?Procedure Code(s): ?     --- Professional --- ?     [phone], Bronchoscopy, rigid or flexible, including fluoroscopic guidance,  ?     when performed; with transbronchial needle aspiration biopsy(s),  ?     trachea, main stem and/or lobar bronchus(i) ?     31624, Bronchoscopy, rigid or flexible, including fluoroscopic guidance,  ?     when performed; with bronchial alveolar lavage ?     31623, Bronchoscopy, rigid or flexible, including fluoroscopic guidance,  ?     when performed; with brushing or protected brushings ?     31654, Bronchoscopy, rigid or flexible, including fluoroscopic guidance,  ?     when performed; with transendoscopic endobronchial ultrasound (EBUS)  ?     during bronchoscopic diagnostic or therapeutic intervention(s) for  ?     peripheral lesion(s) (List separately in addition to code for primary  ?     procedure[s]) ?Diagnosis Code(s): ?     --- Professional --- ?     R91.8, Other nonspecific abnormal finding of lung field ?     R59.0, Localized enlarged lymph nodes ?CPT copyright 2019 American Medical Association. All rights  reserved. ?The codes documented in this report are preliminary and upon coder review may  ?be revised to meet current compliance requirements. ?Rodman Pickle, MD ?07/26/2021 3:44:51 PM ?This report has been signed electronically. ?Number of Addenda: 0 ?Scope In: ?Scope Out: ?

## 2021-07-26 NOTE — Transfer of Care (Signed)
Immediate Anesthesia Transfer of Care Note ? ?Patient: Dakota Bryant ? ?Procedure(s) Performed: ENDOBRONCHIAL ULTRASOUND (Bilateral) ?VIDEO BRONCHOSCOPY WITHOUT FLUORO ?BRONCHIAL BRUSHINGS ?BRONCHIAL WASHINGS ?FINE NEEDLE ASPIRATION ? ?Patient Location: Endoscopy Unit ? ?Anesthesia Type:General ? ?Level of Consciousness: drowsy ? ?Airway & Oxygen Therapy: Patient Spontanous Breathing and Patient connected to face mask oxygen ? ?Post-op Assessment: Report given to RN and Post -op Vital signs reviewed and stable ? ?Post vital signs: Reviewed and stable ? ?Last Vitals:  ?Vitals Value Taken Time  ?BP 151/91 07/26/21 1439  ?Temp    ?Pulse 83 07/26/21 1439  ?Resp    ?SpO2 100 % 07/26/21 1439  ?Vitals shown include unvalidated device data. ? ?Last Pain:  ?Vitals:  ? 07/26/21 1202  ?TempSrc: Oral  ?PainSc: 0-No pain  ?   ? ?  ? ?Complications: No notable events documented. ?

## 2021-07-26 NOTE — Anesthesia Procedure Notes (Signed)
Procedure Name: Intubation ?Date/Time: 07/26/2021 1:24 PM ?Performed by: Sharlette Dense, CRNA ?Pre-anesthesia Checklist: Patient identified, Emergency Drugs available, Suction available and Patient being monitored ?Patient Re-evaluated:Patient Re-evaluated prior to induction ?Oxygen Delivery Method: Circle system utilized ?Preoxygenation: Pre-oxygenation with 100% oxygen ?Induction Type: IV induction ?Ventilation: Mask ventilation without difficulty ?Laryngoscope Size: Sabra Heck ?Grade View: Grade I ?Tube type: Oral ?Tube size: 8.5 mm ?Number of attempts: 1 ?Airway Equipment and Method: Stylet ?Placement Confirmation: ETT inserted through vocal cords under direct vision, positive ETCO2 and breath sounds checked- equal and bilateral ?Tube secured with: Tape ?Dental Injury: Teeth and Oropharynx as per pre-operative assessment  ? ? ? ? ?

## 2021-07-26 NOTE — Anesthesia Postprocedure Evaluation (Signed)
Anesthesia Post Note ? ?Patient: Dakota Bryant ? ?Procedure(s) Performed: ENDOBRONCHIAL ULTRASOUND (Bilateral) ?VIDEO BRONCHOSCOPY WITHOUT FLUORO ?BRONCHIAL BRUSHINGS ?BRONCHIAL WASHINGS ?FINE NEEDLE ASPIRATION ? ?  ? ?Patient location during evaluation: Endoscopy ?Anesthesia Type: General ?Level of consciousness: awake and alert ?Pain management: pain level controlled ?Vital Signs Assessment: post-procedure vital signs reviewed and stable ?Respiratory status: spontaneous breathing, nonlabored ventilation and respiratory function stable ?Cardiovascular status: blood pressure returned to baseline and stable ?Postop Assessment: no apparent nausea or vomiting ?Anesthetic complications: no ? ? ?No notable events documented. ? ?Last Vitals:  ?Vitals:  ? 07/26/21 1450 07/26/21 1500  ?BP: 114/65 98/69  ?Pulse: 78 81  ?Resp: 13 12  ?Temp:    ?SpO2: 96% 93%  ?  ?Last Pain:  ?Vitals:  ? 07/26/21 1500  ?TempSrc:   ?PainSc: 0-No pain  ? ? ?  ?  ?  ?  ?  ?  ? ?Lidia Collum ? ? ? ? ?

## 2021-07-26 NOTE — Interval H&P Note (Signed)
History and Physical Interval Note: ? ?07/26/2021 ?11:51 AM ? ?Dakota Bryant  has presented today for surgery, with the diagnosis of L lung mass.  The various methods of treatment have been discussed with the patient and family. After consideration of risks, benefits and other options for treatment, the patient has consented to  Procedure(s): ?ENDOBRONCHIAL ULTRASOUND (Bilateral) as a surgical intervention.  The patient's history has been reviewed, patient examined, no change in status, stable for surgery.  I have reviewed the patient's chart and labs.  Questions were answered to the patient's satisfaction.   ? ? ?Braxden Lovering Rodman Pickle, MD ? ? ?

## 2021-07-27 DIAGNOSIS — R1310 Dysphagia, unspecified: Secondary | ICD-10-CM | POA: Insufficient documentation

## 2021-07-27 DIAGNOSIS — Z9049 Acquired absence of other specified parts of digestive tract: Secondary | ICD-10-CM | POA: Insufficient documentation

## 2021-07-27 LAB — FUNGUS STAIN

## 2021-07-28 ENCOUNTER — Encounter (HOSPITAL_COMMUNITY): Payer: Self-pay | Admitting: Pulmonary Disease

## 2021-07-28 LAB — CULTURE, RESPIRATORY W GRAM STAIN
Culture: NORMAL
Gram Stain: NONE SEEN

## 2021-07-28 LAB — ACID FAST SMEAR (AFB, MYCOBACTERIA): Acid Fast Smear: NEGATIVE

## 2021-07-29 ENCOUNTER — Telehealth: Payer: Self-pay | Admitting: Pulmonary Disease

## 2021-07-29 DIAGNOSIS — C3490 Malignant neoplasm of unspecified part of unspecified bronchus or lung: Secondary | ICD-10-CM

## 2021-07-29 LAB — CYTOLOGY - NON PAP

## 2021-07-29 NOTE — Telephone Encounter (Signed)
[facility] Telephone Encounter ? ?Patient called back and we discussed his bronchoscopy results which are consistent with small cell carcinoma. He is aware Oncology office will be contacting him and that MRI brain will be ordered. ? ?Bay - please cancel his appointment with me on 08/02/21 and place recall with me in 3 months ? ? 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 He presented with a left lower lung lesion and EBUS demonstrated +4R and +10R nodules. Thank you for working on follow-up. Patient is awaiting appointment time. ? ? ? ?

## 2021-07-29 NOTE — Telephone Encounter (Signed)
[facility] Telephone Encounter ? ?I attempted to call patient's cell phone number to discuss bronchoscopy results from 07/26/21. Unable to leave voicemail. I have called his home phone and left message with office callback number so staff can page me when I am in the hospital. ? ?Rodman Pickle, M.D. ?[facility] ?07/29/2021 12:40 PM  ? ?See Amion for personal pager ?For hours between 7 PM to 7 AM, please call Elink for urgent questions ? ?

## 2021-08-01 ENCOUNTER — Other Ambulatory Visit (INDEPENDENT_AMBULATORY_CARE_PROVIDER_SITE_OTHER): Payer: Self-pay

## 2021-08-01 ENCOUNTER — Telehealth: Payer: Self-pay | Admitting: Hematology and Oncology

## 2021-08-01 DIAGNOSIS — R1310 Dysphagia, unspecified: Secondary | ICD-10-CM

## 2021-08-01 DIAGNOSIS — K5732 Diverticulitis of large intestine without perforation or abscess without bleeding: Secondary | ICD-10-CM

## 2021-08-01 DIAGNOSIS — K631 Perforation of intestine (nontraumatic): Secondary | ICD-10-CM

## 2021-08-01 NOTE — Telephone Encounter (Signed)
Scheduled per 5/12 in basket, pt has been called and confirmed ? ?

## 2021-08-02 ENCOUNTER — Encounter (INDEPENDENT_AMBULATORY_CARE_PROVIDER_SITE_OTHER): Payer: Self-pay

## 2021-08-02 ENCOUNTER — Ambulatory Visit: Payer: Self-pay | Admitting: Pulmonary Disease

## 2021-08-02 ENCOUNTER — Inpatient Hospital Stay: Payer: Medicaid Other

## 2021-08-02 ENCOUNTER — Telehealth (INDEPENDENT_AMBULATORY_CARE_PROVIDER_SITE_OTHER): Payer: Self-pay

## 2021-08-02 ENCOUNTER — Inpatient Hospital Stay: Payer: Medicaid Other | Attending: Hematology and Oncology | Admitting: Hematology and Oncology

## 2021-08-02 ENCOUNTER — Other Ambulatory Visit: Payer: Self-pay

## 2021-08-02 VITALS — BP 106/71 | HR 69 | Temp 97.4°F | Resp 16 | Wt 125.1 lb

## 2021-08-02 DIAGNOSIS — C3432 Malignant neoplasm of lower lobe, left bronchus or lung: Secondary | ICD-10-CM | POA: Diagnosis present

## 2021-08-02 DIAGNOSIS — Z5111 Encounter for antineoplastic chemotherapy: Secondary | ICD-10-CM | POA: Insufficient documentation

## 2021-08-02 DIAGNOSIS — R918 Other nonspecific abnormal finding of lung field: Secondary | ICD-10-CM

## 2021-08-02 DIAGNOSIS — R5383 Other fatigue: Secondary | ICD-10-CM | POA: Diagnosis not present

## 2021-08-02 LAB — CMP (CANCER CENTER ONLY)
ALT: 12 U/L (ref 0–44)
AST: 13 U/L — ABNORMAL LOW (ref 15–41)
Albumin: 4.3 g/dL (ref 3.5–5.0)
Alkaline Phosphatase: 52 U/L (ref 38–126)
Anion gap: 3 — ABNORMAL LOW (ref 5–15)
BUN: <5 mg/dL — ABNORMAL LOW (ref 6–20)
CO2: 30 mmol/L (ref 22–32)
Calcium: 9.1 mg/dL (ref 8.9–10.3)
Chloride: 91 mmol/L — ABNORMAL LOW (ref 98–111)
Creatinine: 0.71 mg/dL (ref 0.61–1.24)
GFR, Estimated: >60 mL/min (ref >60)
Glucose, Bld: 138 mg/dL — ABNORMAL HIGH (ref 70–99)
Potassium: 4.2 mmol/L (ref 3.5–5.1)
Sodium: 124 mmol/L — ABNORMAL LOW (ref 135–145)
Total Bilirubin: 0.6 mg/dL (ref 0.3–1.2)
Total Protein: 6.7 g/dL (ref 6.5–8.1)

## 2021-08-02 LAB — CBC WITH DIFFERENTIAL (CANCER CENTER ONLY)
Abs Immature Granulocytes: 0.06 10*3/uL (ref 0.00–0.07)
Basophils Absolute: 0.1 10*3/uL (ref 0.0–0.1)
Basophils Relative: 1 %
Eosinophils Absolute: 0.1 10*3/uL (ref 0.0–0.5)
Eosinophils Relative: 2 %
HCT: 41.2 % (ref 39.0–52.0)
Hemoglobin: 15.2 g/dL (ref 13.0–17.0)
Immature Granulocytes: 1 %
Lymphocytes Relative: 19 %
Lymphs Abs: 1.8 10*3/uL (ref 0.7–4.0)
MCH: 32.5 pg (ref 26.0–34.0)
MCHC: 36.9 g/dL — ABNORMAL HIGH (ref 30.0–36.0)
MCV: 88 fL (ref 80.0–100.0)
Monocytes Absolute: 0.8 10*3/uL (ref 0.1–1.0)
Monocytes Relative: 9 %
Neutro Abs: 6.4 10*3/uL (ref 1.7–7.7)
Neutrophils Relative %: 68 %
Platelet Count: 324 10*3/uL (ref 150–400)
RBC: 4.68 MIL/uL (ref 4.22–5.81)
RDW: 12.4 % (ref 11.5–15.5)
WBC Count: 9.3 10*3/uL (ref 4.0–10.5)
nRBC: 0 % (ref 0.0–0.2)

## 2021-08-02 LAB — TSH: TSH: 5.425 u[IU]/mL — ABNORMAL HIGH (ref 0.350–4.500)

## 2021-08-02 LAB — LACTATE DEHYDROGENASE: LDH: 106 U/L (ref 98–192)

## 2021-08-02 MED ORDER — AZITHROMYCIN 250 MG PO TABS: ORAL_TABLET | ORAL | 0 refills | Status: DC

## 2021-08-02 MED ORDER — CETIRIZINE HCL 10 MG PO TABS: 10.0000 mg | ORAL_TABLET | Freq: Every day | ORAL | 2 refills | Status: DC

## 2021-08-02 MED ORDER — PEG 3350-KCL-NA BICARB-NACL 420 G PO SOLR: 4000.0000 mL | ORAL | 0 refills | Status: DC

## 2021-08-02 MED ORDER — ONDANSETRON HCL 8 MG PO TABS
8.0000 mg | ORAL_TABLET | Freq: Three times a day (TID) | ORAL | 0 refills | Status: DC | PRN
Start: 2021-08-02 — End: 2022-02-28

## 2021-08-02 MED ORDER — PROCHLORPERAZINE MALEATE 10 MG PO TABS: 10.0000 mg | ORAL_TABLET | Freq: Four times a day (QID) | ORAL | 0 refills | Status: DC | PRN
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
## 2021-08-02 NOTE — Telephone Encounter (Signed)
Ekam Bonebrake Ann Genova Kiner, CMA  ?

## 2021-08-03 ENCOUNTER — Other Ambulatory Visit: Payer: Self-pay | Admitting: Hematology and Oncology

## 2021-08-03 ENCOUNTER — Telehealth: Payer: Self-pay | Admitting: Dietician

## 2021-08-03 NOTE — Telephone Encounter (Signed)
Patient screened on MST. First attempt to reach. Home number voice mail said reaching [place]. Sent text to mobile as well. Provided my cell# on voice mail to return call to set up a nutrition consult. ? ?April Manson, RDN, LDN ?Registered Dietitian, [place] ?Part Time Remote (Usual office hours: Tuesday-Thursday) ?Cell: [phone]   ?

## 2021-08-03 NOTE — Progress Notes (Signed)
START ON PATHWAY REGIMEN - Small Cell Lung ? ? ?  A cycle is every 21 days: ?    Etoposide  ?    Cisplatin  ? ?**Always confirm dose/schedule in your pharmacy ordering system** ? ?Patient Characteristics: ?Newly Diagnosed, Preoperative or Nonsurgical Candidate (Clinical Staging), First Line, Limited Stage, Nonsurgical Candidate ?Therapeutic Status: Newly Diagnosed, Preoperative or Nonsurgical Candidate (Clinical Staging) ?AJCC T Category: cTX ?AJCC N Category: cNX ?AJCC M Category: cM0 ?AJCC 8 Stage Grouping: IIIB ?Stage Classification: Limited ?Surgical Candidacy: Nonsurgical Candidate ?Intent of Therapy: ?Non-Curative / Palliative Intent, Discussed with Patient ?

## 2021-08-04 ENCOUNTER — Telehealth: Payer: Self-pay | Admitting: Dietician

## 2021-08-04 DIAGNOSIS — C3432 Malignant neoplasm of lower lobe, left bronchus or lung: Secondary | ICD-10-CM | POA: Diagnosis not present
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
## 2021-08-05 ENCOUNTER — Telehealth: Payer: Self-pay | Admitting: Hematology and Oncology

## 2021-08-05 NOTE — Telephone Encounter (Signed)
Patient is returning phone call. Patient phone number is [phone].

## 2021-08-05 NOTE — Telephone Encounter (Signed)
Called and spoke with patient to get him scheduled for 3 month follow up. Recall has been deleted. Patient is scheduled for PFT next month and has been scheduled for OV on 10/25/21. Nothing further needed at this time. Next Appt With Pulmonology ([facility] PFT room) 09/02/2021 at 10:00 AM

## 2021-08-05 NOTE — Telephone Encounter (Signed)
Scheduled per 5/16 los, attempted to call pt 2x, and left 1 voicemail, spoke with relative who said they would tell pt to call us back about appts

## 2021-08-06 ENCOUNTER — Ambulatory Visit (HOSPITAL_COMMUNITY)
Admission: RE | Admit: 2021-08-06 | Discharge: 2021-08-06 | Disposition: A | Discharge status: Home / Self Care | Payer: Medicaid Other | Source: Ambulatory Visit | Attending: Pulmonary Disease | Admitting: Pulmonary Disease

## 2021-08-06 DIAGNOSIS — C3490 Malignant neoplasm of unspecified part of unspecified bronchus or lung: Secondary | ICD-10-CM | POA: Diagnosis not present

## 2021-08-06 MED ORDER — GADOBUTROL 1 MMOL/ML IV SOLN
5.0000 mL | Freq: Once | INTRAVENOUS | Status: AC | PRN
  Administered 2021-08-06: 5 mL via INTRAVENOUS

## 2021-08-07 NOTE — Progress Notes (Signed)
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 Primary cancer of left lower lobe of lung (HCC)  C34.32       HISTORY OF PRESENT ILLNESS::Dakota Bryant is a 51 y.o. male who presented in March with perforated diverticulitis and purulent ascites.  He underwent sigmoid colectomy with diverting colostomy on 06/03/21 with Dr. Curlene Labrum.  During that work-up, abdominal CT on 06/03/21 showed a 2.4 cm left infrahilar mass. Subsequent Chest CT on 06/08/2021 confirmed the left lower lobe solid infrahilar mass up to 3 cm, concerning for primary lung malignancy.  Subsequently the patient underwent a PET CT scan on 07/18/2021 which showed left lower lobe mass as well as contralateral mediastinal and right hilar adenopathy, noted to be at least stage IIIB disease.  Bronchoscopy was performed on 07/26/2021.  FNA biopsy of right sided level 4R and 10R nodes revealed small cell lung cancer. Staging brain MRI was completed yesterday showing no metastases.  The brain MRI did show residual left maxillary sinusitis and mastoiditis, and the patient is complaining of painful ringing ears s/p antibiotics.  He has been referred today for consideration of concurrent chemoradiotherapy.  PREVIOUS RADIATION THERAPY: No  Past Medical History:  Diagnosis Date   Acid reflux    Colostomy in place [facility])    Emphysema lung ([place])    Hypertension    Mass of left lung    Perforated diverticulum   :   Past Surgical History:  Procedure Laterality Date   BRONCHIAL BRUSHINGS  07/26/2021   Procedure: BRONCHIAL BRUSHINGS;  Surgeon: Margaretha Seeds, MD;  Location: [facility];  Service: Cardiopulmonary;;   BRONCHIAL WASHINGS  07/26/2021    Procedure: BRONCHIAL WASHINGS;  Surgeon: Margaretha Seeds, MD;  Location: [facility];  Service: Cardiopulmonary;;   COLOSTOMY N/A 06/03/2021   Procedure: SIGMOID COLECTOMY WITH COLOSTOMY;  Surgeon: Virl Cagey, MD;  Location: [facility];  Service: General;  Laterality: N/A;   ENDOBRONCHIAL ULTRASOUND Bilateral 07/26/2021   Procedure: ENDOBRONCHIAL ULTRASOUND;  Surgeon: Margaretha Seeds, MD;  Location: [facility];  Service: Cardiopulmonary;  Laterality: Bilateral;   FINE NEEDLE ASPIRATION  07/26/2021   Procedure: FINE NEEDLE ASPIRATION;  Surgeon: Margaretha Seeds, MD;  Location: [facility];  Service: Cardiopulmonary;;   HAND TENDON SURGERY     KNEE ARTHROSCOPY Left    VIDEO BRONCHOSCOPY  07/26/2021   Procedure: VIDEO BRONCHOSCOPY WITHOUT FLUORO;  Surgeon: Margaretha Seeds, MD;  Location: [facility];  Service: Cardiopulmonary;;  :   Current Outpatient Medications:    amoxicillin-clavulanate (AUGMENTIN) 875-125 MG tablet, Take 1 tablet by mouth 2 (two) times daily., Disp: 14 tablet, Rfl: 0   acetaminophen (TYLENOL) 500 MG tablet, Take 1,000 mg by mouth every 8 (eight) hours as needed for headache or moderate pain., Disp: , Rfl:    amLODipine (NORVASC) 10 MG tablet, Take 1 tablet (10 mg total) by mouth daily., Disp: 30 tablet, Rfl: 4   azithromycin (ZITHROMAX Z-PAK) 250 MG tablet, Take 2 pills on first day and 1 pill every day thereafter., Disp: 6 each, Rfl: 0   cetirizine (ZYRTEC ALLERGY) 10 MG tablet, Take 1 tablet (10 mg total) by mouth daily., Disp: 30 tablet, Rfl:  2   hydrOXYzine (ATARAX) 25 MG tablet, Take 1 tablet (25 mg total) by mouth every 6 (six) hours as needed for anxiety or nausea., Disp: 30 tablet, Rfl: 0   labetalol (NORMODYNE) 200 MG tablet, Take 1 tablet (200 mg total) by mouth 2 (two) times daily., Disp: 60 tablet, Rfl: 3   Multiple Vitamin (MULTIVITAMIN WITH MINERALS) TABS tablet, Take 1 tablet by mouth daily., Disp: 120 tablet, Rfl: 3   naphazoline-pheniramine  (VISINE) 0.025-0.3 % ophthalmic solution, Place 1 drop into both eyes 4 (four) times daily as needed for eye irritation., Disp: , Rfl:    omeprazole (PRILOSEC) 40 MG capsule, Take 1 capsule (40 mg total) by mouth daily., Disp: 30 capsule, Rfl: 1   ondansetron (ZOFRAN) 8 MG tablet, Take 1 tablet (8 mg total) by mouth every 8 (eight) hours as needed., Disp: 30 tablet, Rfl: 0   oxyCODONE (ROXICODONE) 5 MG immediate release tablet, Take 1 tablet (5 mg total) by mouth every 4 (four) hours as needed for severe pain or breakthrough pain. No alcohol while taking this, Disp: 20 tablet, Rfl: 0   pantoprazole (PROTONIX) 40 MG tablet, Take 40 mg by mouth daily., Disp: , Rfl:    polyethylene glycol-electrolytes (TRILYTE) 420 g solution, Take 4,000 mLs by mouth as directed., Disp: 4000 mL, Rfl: 0   prochlorperazine (COMPAZINE) 10 MG tablet, Take 1 tablet (10 mg total) by mouth every 6 (six) hours as needed for nausea or vomiting., Disp: 30 tablet, Rfl: 0   tamsulosin (FLOMAX) 0.4 MG CAPS capsule, Take 1 capsule (0.4 mg total) by mouth daily. (Patient not taking: Reported on 08/02/2021), Disp: 30 capsule, Rfl: 0   traZODone (DESYREL) 50 MG tablet, Take 1 tablet (50 mg total) by mouth at bedtime., Disp: 30 tablet, Rfl: 4   umeclidinium-vilanterol (ANORO ELLIPTA) 62.5-25 MCG/ACT AEPB, Inhale 1 puff into the lungs daily. (Patient not taking: Reported on 07/25/2021), Disp: 60 each, Rfl: 5:  No Known Allergies:  No family history on file.:   Social History   Socioeconomic History   Marital status: Divorced    Spouse name: Not on file   Number of children: Not on file   Years of education: Not on file   Highest education level: Not on file  Occupational History   Not on file  Tobacco Use   Smoking status: Every Day    Packs/day: 1.00    Years: 1.00    Pack years: 1.00    Types: Cigarettes    Passive exposure: Current   Smokeless tobacco: Never   Tobacco comments:    Started smoking pack a day since 14   Vaping Use   Vaping Use: Never used  Substance and Sexual Activity   Alcohol use: Yes    Alcohol/week: 2.0 standard drinks    Types: 2 Cans of beer per week    Comment: occasional   Drug use: Yes    Types: Marijuana    Comment: occ   Sexual activity: Not on file  Other Topics Concern   Not on file  Social History Narrative   Not on file   Social Determinants of Health   Financial Resource Strain: Not on file  Food Insecurity: Not on file  Transportation Needs: Not on file  Physical Activity: Not on file  Stress: Not on file  Social Connections: Not on file  Intimate Partner Violence: Not on file  :  REVIEW OF SYSTEMS:  A 15 point review of systems is documented in the electronic  medical record. This was obtained by the nursing staff. However, I reviewed this with the patient to discuss relevant findings and make appropriate changes.  Pertinent items are noted in HPI.   PHYSICAL EXAM:  Blood pressure 96/75, pulse 69, temperature (!) 97 F (36.1 C), temperature source Temporal, resp. rate 18, height 5\' 6"  (1.676 m), weight 131 lb 2 oz (59.5 kg), SpO2 100 %. General appearance: alert and cooperative Lungs:  Normal respiratory effort Neurologic: Grossly normal   KPS = 100  100 - Normal; no complaints; no evidence of disease. 90   - Able to carry on normal activity; minor signs or symptoms of disease. 80   - Normal activity with effort; some signs or symptoms of disease. 34   - Cares for self; unable to carry on normal activity or to do active work. 60   - Requires occasional assistance, but is able to care for most of his personal needs. 50   - Requires considerable assistance and frequent medical care. 72   - Disabled; requires special care and assistance. 30   - Severely disabled; hospital admission is indicated although death not imminent. 61   - Very sick; hospital admission necessary; active supportive treatment necessary. 10   - Moribund; fatal processes progressing  rapidly. 0     - Dead  Karnofsky DA, Abelmann [place], Craver LS and Burchenal [facility] [phone]) The use of the nitrogen mustards in the palliative treatment of carcinoma: with particular reference to bronchogenic carcinoma Cancer 1 [id number]  LABORATORY DATA:  Lab Results  Component Value Date   WBC 9.3 08/02/2021   HGB 15.2 08/02/2021   HCT 41.2 08/02/2021   MCV 88.0 08/02/2021   PLT 324 08/02/2021   Lab Results  Component Value Date   NA 124 (L) 08/02/2021   K 4.2 08/02/2021   CL 91 (L) 08/02/2021   CO2 30 08/02/2021   Lab Results  Component Value Date   ALT 12 08/02/2021   AST 13 (L) 08/02/2021   ALKPHOS 52 08/02/2021   BILITOT 0.6 08/02/2021     RADIOGRAPHY: MR BRAIN W WO CONTRAST  Result Date: 08/08/2021 CLINICAL DATA:  Small cell lung cancer staging. EXAM: MRI HEAD WITHOUT AND WITH CONTRAST TECHNIQUE: Multiplanar, multiecho pulse sequences of the brain and surrounding structures were obtained without and with intravenous contrast. CONTRAST:  2mL GADAVIST GADOBUTROL 1 MMOL/ML IV SOLN COMPARISON:  None Available. FINDINGS: Brain: There is no evidence of an acute infarct, intracranial hemorrhage, mass, midline shift, or extra-axial fluid collection. The ventricles and sulci are normal. Small T2 hyperintensities in the cerebral white matter bilaterally are nonspecific but compatible with mild chronic small vessel ischemic disease. No abnormal enhancement is identified. Vascular: Major intracranial vascular flow voids are preserved. Skull and upper cervical spine: Unremarkable bone marrow signal. Sinuses/Orbits: Unremarkable orbits. Mild mucosal thickening in the left maxillary sinus. Trace bilateral mastoid effusions. Other: None. IMPRESSION: 1. No evidence of intracranial metastases. 2. Mild chronic small vessel ischemic disease. Electronically Signed   By: Logan Bores M.D.   On: 08/08/2021 09:48   NM PET Image Initial (PI) Skull Base To Thigh  Result Date: 07/18/2021 CLINICAL DATA:  Initial  treatment strategy for left lung mass. History of colectomy and colostomy on 06/03/2021. EXAM: NUCLEAR MEDICINE PET SKULL BASE TO THIGH TECHNIQUE: 6.3 mCi F-18 FDG was injected intravenously. Full-ring PET imaging was performed from the skull base to thigh after the radiotracer. CT data was obtained and used for attenuation correction and anatomic localization.  Fasting blood glucose: 100 mg/dl COMPARISON:  CT chest 06/08/2021, CT abdomen pelvis 06/03/2021, CT chest 05/27/2018. FINDINGS: Mediastinal blood pool activity: SUV max 1.7 Liver activity: SUV max NA NECK: Intense uptake in the mouth may be due to salivary pooling. No abnormal hypermetabolism. Incidental CT findings: None. CHEST: Hypermetabolic low right paratracheal and right hilar lymph nodes. Low right paratracheal lymph node measures 13 mm with an SUV max 8.0. Central left lower lobe mass measures 2.0 x 3.1 cm (7/50), SUV max 14.0. 5 mm right upper lobe nodule (7/26), SUV max 1.0. Incidental CT findings: Left anterior descending coronary artery calcification. Heart size normal. No pericardial or pleural effusion. ABDOMEN/PELVIS: No abnormal hypermetabolism in the liver, adrenal glands, spleen or pancreas. Focal hypermetabolism is seen at the suture line in the rectosigmoid colon, compatible with sigmoid colectomy on 06/03/2021. No hypermetabolic lymph nodes. Incidental CT findings: Liver, gallbladder, adrenal glands and right kidney are unremarkable. Stones in the left kidney. Spleen, pancreas, stomach and small bowel are otherwise unremarkable. Left lower quadrant colostomy. Atherosclerotic calcification of the aorta. SKELETON: No abnormal hypermetabolism. Incidental CT findings: Mild degenerative changes in the spine. IMPRESSION: 1. Hypermetabolic left lower lobe mass with hypermetabolic contralateral mediastinal and right hilar adenopathy. Indeterminate 5 mm right upper lobe nodule. Findings are most indicative of T2aN3Mx or at least stage IIIB  disease. 2. Left renal stones. 3. Left anterior descending coronary artery calcification. Electronically Signed   By: Lorin Picket M.D.   On: 07/18/2021 16:12   NM PET SUPER D CT  Result Date: 07/18/2021 CLINICAL DATA:  Left lung mass, weight loss, chest pain. * Tracking Code: BO * EXAM: CT CHEST WITHOUT CONTRAST TECHNIQUE: Multidetector CT imaging of the chest was performed using thin slice collimation for electromagnetic bronchoscopy planning purposes, without intravenous contrast. RADIATION DOSE REDUCTION: This exam was performed according to the departmental dose-optimization program which includes automated exposure control, adjustment of the mA and/or kV according to patient size and/or use of iterative reconstruction technique. COMPARISON:  PET same day, CT chest 06/08/2021 and 05/27/2018. FINDINGS: Cardiovascular: Heart size normal.  No pericardial effusion. Mediastinum/Nodes: Low right paratracheal lymph node measures 1.2 cm, stable. Hilar regions are difficult to definitively evaluate without IV contrast but a hypermetabolic right hilar intrapulmonary lymph node was seen on today's PET. No axillary adenopathy. Esophagus is grossly unremarkable. Lungs/Pleura: Smoking related respiratory bronchiolitis. Central left lower lobe mass measures 1.9 x 3.1 cm (4/102), as on 06/08/2021. Lungs are otherwise clear. No pleural fluid. Adherent debris in the airway. Upper Abdomen: Visualized portions of the liver, adrenal glands, kidneys, spleen, pancreas, stomach and bowel are grossly unremarkable. Musculoskeletal: Mild degenerative changes in the spine. No worrisome lytic or sclerotic lesions. IMPRESSION: 1. Left lower lobe mass with contralateral mediastinal and hilar adenopathy, compatible with primary bronchogenic carcinoma. Please refer to today's PET for staging. 2. 5 mm right upper lobe nodule, indeterminate. Recommend attention on follow-up. Electronically Signed   By: Lorin Picket M.D.   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  We discussed the natural history of limited stage small cell carcinoma and general treatment, highlighting the role of radiotherapy in the management.  We discussed the available radiation techniques, and focused on the details of logistics and delivery.  We reviewed the anticipated acute and late sequelae associated with radiation  in this setting.  The patient was encouraged to ask questions that I answered to the best of my ability.  The patient would like to proceed with radiation and will be scheduled for CT simulation tomorrow morning.  I will plan to treat the chest with chemotherapy.  We briefly discussed prophylactic cranial irradiation with a plan to talk further about it later, if clinically appropriate.  I gave him Augmentin for his symptomatic sinusitis/mastoiditis.  I personally spent 60 minutes in this encounter including chart review, reviewing radiological studies, meeting face-to-face with the patient, entering orders and completing documentation.    ------------------------------------------------   Tyler Pita, MD [place]: [phone]  Fax: [phone] [email or web address]  Skype  [email or web address]

## 2021-08-08 ENCOUNTER — Ambulatory Visit
Admission: RE | Admit: 2021-08-08 | Discharge: 2021-08-08 | Disposition: A | Discharge status: Home / Self Care | Payer: Medicaid Other | Source: Ambulatory Visit | Attending: Radiation Oncology | Admitting: Radiation Oncology

## 2021-08-08 ENCOUNTER — Other Ambulatory Visit: Payer: Self-pay

## 2021-08-08 VITALS — BP 96/75 | HR 69 | Temp 97.0°F | Resp 18 | Ht 66.0 in | Wt 131.1 lb

## 2021-08-08 DIAGNOSIS — N200 Calculus of kidney: Secondary | ICD-10-CM | POA: Insufficient documentation

## 2021-08-08 DIAGNOSIS — Z933 Colostomy status: Secondary | ICD-10-CM | POA: Diagnosis not present

## 2021-08-08 DIAGNOSIS — R590 Localized enlarged lymph nodes: Secondary | ICD-10-CM | POA: Insufficient documentation

## 2021-08-08 DIAGNOSIS — Z79899 Other long term (current) drug therapy: Secondary | ICD-10-CM | POA: Insufficient documentation

## 2021-08-08 DIAGNOSIS — I10 Essential (primary) hypertension: Secondary | ICD-10-CM | POA: Insufficient documentation

## 2021-08-08 DIAGNOSIS — C3432 Malignant neoplasm of lower lobe, left bronchus or lung: Secondary | ICD-10-CM | POA: Diagnosis not present

## 2021-08-08 DIAGNOSIS — J439 Emphysema, unspecified: Secondary | ICD-10-CM | POA: Insufficient documentation

## 2021-08-08 DIAGNOSIS — F17210 Nicotine dependence, cigarettes, uncomplicated: Secondary | ICD-10-CM | POA: Diagnosis not present

## 2021-08-08 MED ORDER — LIDOCAINE-PRILOCAINE 2.5-2.5 % EX CREA: 1.0000 "application " | TOPICAL_CREAM | CUTANEOUS | 0 refills | Status: DC | PRN

## 2021-08-08 MED ORDER — AMOXICILLIN-POT CLAVULANATE 875-125 MG PO TABS: 1.0000 | ORAL_TABLET | Freq: Two times a day (BID) | ORAL | 0 refills | Status: DC

## 2021-08-08 NOTE — Progress Notes (Signed)
Thoracic Location of Tumor / Histology: Small cell carcinoma of the left lower lung  NM PET Skull Base to Thigh (07/18/2021) Incidental CT findings:  Mild degenerative changes in the spine.   IMPRESSION: 1. Hypermetabolic left lower lobe mass with hypermetabolic contralateral mediastinal and right hilar adenopathy. Indeterminate 5 mm right upper lobe nodule. Findings are most indicative of T2aN3Mx or at least stage IIIB disease. 2. Left renal stones. 3. Left anterior descending coronary artery calcification.  NM PET SUPER D CT (07/18/2021) IMPRESSION: 1. Left lower lobe mass with contralateral mediastinal and hilar adenopathy, compatible with primary bronchogenic carcinoma. Please refer to today's PET for staging. 2. 5 mm right upper lobe nodule, indeterminate. Recommend attention on follow-up.  06/07/2021 DG CHEST PORT 1 VIEW CLINICAL DATA:  Abdominal pain   EXAM: PORTABLE CHEST 1 VIEW   COMPARISON:  Chest x-ray dated May 27, 2018; CT abdomen and pelvis dated June 03, 2020   FINDINGS: Cardiac and mediastinal contours are within normal limits. Patchy opacities of the mid right lung and lower left lung. Increased density of the right lung apex, possibly related to patient positioning. No large pleural effusion or pneumothorax.   IMPRESSION: 1. Patchy opacities of the mid right lung and lower left lung.  Recommend dedicated chest CT better evaluation per prior CT reports. 2. Increased density of the right lung apex, possibly related to patient positioning. Recommend attention on follow-up.   06/03/2021  CT ABDOMEN PELVIS W CONTRAST ADDENDUM: Omitted from impression is presence of a 2.4 x 1.5 cm diameter LEFT infrahilar mass, question adenopathy versus mass; dedicated CT chest with contrast recommended for further evaluation.  06/02/2021  CT ABDOMEN PELVIS W CONTRAST FINDINGS: Lower chest: Indeterminate and partially visualized 2.5 x 1.5 cm left infrahilar nodule abutting the  descending thoracic aorta. This is new since the prior CT and partially visualized and not evaluated. Dedicated chest CT is recommended for better evaluation.  The visualized lung bases are otherwise clear.   No intra-abdominal free air.  No significant free fluid.   Hepatobiliary: No focal liver abnormality is seen. No gallstones, gallbladder wall thickening, or biliary dilatation.   Pancreas: Unremarkable. No pancreatic ductal dilatation or surrounding inflammatory changes.   Spleen: Normal in size without focal abnormality.   Adrenals/Urinary Tract: The adrenal glands unremarkable. There is a 1 cm nonobstructing left renal inferior pole calculus. No hydronephrosis. The right kidney is unremarkable. There is symmetric enhancement and excretion of contrast by both kidneys. The visualized ureters and urinary bladder appear unremarkable.   Stomach/Bowel: Several sigmoid diverticula noted. There is inflammatory changes of a segment of the sigmoid colon centered high a sigmoid diverticula within the pelvis (67/2 and coronal 59/5) consistent with acute diverticulitis. There is perforation of the inflamed sigmoid diverticula with several scattered focally contained pockets of air. No drainable fluid collection or abscess.  Mildly thickened and inflamed loops of distal small bowel in the lower abdomen likely reactive to sigmoid diverticulitis. There is no bowel obstruction. The appendix is not visualized with certainty. No inflammatory changes identified in the right lower quadrant.   Vascular/Lymphatic: Moderate aortoiliac atherosclerotic disease. The IVC is unremarkable. No portal venous gas. There is no adenopathy.   Reproductive: The prostate and seminal vesicles are grossly unremarkable. No pelvic mass.   Other: None   Musculoskeletal: Mild degenerative changes. No acute osseous pathology.   IMPRESSION: 1. Acute perforated sigmoid diverticulitis.  No abscess. 2. Indeterminate and partially  visualized 2.5 x 1.5 cm left infrahilar nodule, concerning for  neoplasm. Dedicated chest CT is recommended for better evaluation. 3. A 1 cm nonobstructing left renal inferior pole calculus. No hydronephrosis. 4. Aortic Atherosclerosis (ICD10-I70.0).   Tobacco/Marijuana/Snuff/ETOH use: Yes to tobacco smoking, marijuana use, and alcohol.  Past/Anticipated interventions by cardiothoracic surgery, if any:  Past/Anticipated interventions by medical oncology, if any:   08/15/2021   Planned start Ciplatin D1 + Etoposide D1-3 every 21 days.  Signs/Symptoms Weight changes, if any:  Loss weight 10 lbs over several months started in September 2022. Respiratory complaints, if any: SOB before lung biopsy has inhalers. Hemoptysis, if any:  Productive cough no blood. Pain issues, if any:  6/10 chest and stomach ( rupture intestines  SAFETY ISSUES: Prior radiation?   No Pacemaker/ICD?  No Possible current pregnancy?  Male Is the patient on methotrexate?  No  Current Complaints / other details:  (08/10/2021) Guided Port Insertion

## 2021-08-08 NOTE — Progress Notes (Signed)
  Radiation Oncology         [phone] ________________________________  Name: Dakota Bryant MRN: [id number]  Date: 08/09/2021  DOB: 06/11/1970  SIMULATION AND TREATMENT PLANNING NOTE    ICD-10-CM   1. 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  Identity was confirmed.  All relevant records and images related to the planned course of therapy were reviewed.  The patient freely provided informed written consent to proceed with treatment after reviewing the details related to the planned course of therapy. The consent form was witnessed and verified by the simulation staff.  Then, the patient was set-up in a stable reproducible  supine position for radiation therapy.  CT images were obtained.  Surface markings were placed.  The CT images were loaded into the planning software.  Then the target and avoidance structures were contoured.  Treatment planning then occurred.  The radiation prescription was entered and confirmed.  Then, I designed and supervised the construction of a total of 6 medically necessary complex treatment devices, including a BodyFix immobilization mold custom fitted to the patient along with 5 multileaf collimators conformally shaped radiation around the treatment target while shielding critical structures such as the heart and spinal cord maximally.  I have requested : 3D Simulation  I have requested a DVH of the following structures: Left lung, right lung, spinal cord, heart, esophagus, and target.  I have ordered:Nutrition Consult  SPECIAL TREATMENT PROCEDURE:  The planned course of therapy using radiation constitutes a special treatment procedure. Special care is required in the management of this patient for the following reasons.  The patient will be receiving concurrent chemotherapy requiring  careful monitoring for increased toxicities of treatment including periodic laboratory values.  The special nature of the planned course of radiotherapy will require increased physician supervision and oversight to ensure patient's safety with optimal treatment outcomes.  PLAN:  The patient will receive 66 Gy in 33 fractions.  ________________________________  Sheral Apley Tammi Klippel, M.D.

## 2021-08-08 NOTE — Progress Notes (Signed)
Spoke with pt to advise that prescription for EMLA cream had been sent to [facility] in [place].

## 2021-08-09 ENCOUNTER — Inpatient Hospital Stay: Payer: Medicaid Other

## 2021-08-09 ENCOUNTER — Ambulatory Visit
Admission: RE | Admit: 2021-08-09 | Discharge: 2021-08-09 | Disposition: A | Discharge status: Home / Self Care | Payer: Medicaid Other | Source: Ambulatory Visit | Attending: Radiation Oncology | Admitting: Radiation Oncology

## 2021-08-09 ENCOUNTER — Encounter: Payer: Self-pay | Admitting: General Surgery

## 2021-08-09 ENCOUNTER — Other Ambulatory Visit: Payer: Self-pay | Admitting: Internal Medicine

## 2021-08-09 ENCOUNTER — Ambulatory Visit (INDEPENDENT_AMBULATORY_CARE_PROVIDER_SITE_OTHER): Payer: Self-pay | Admitting: General Surgery

## 2021-08-09 VITALS — BP 125/85 | HR 75 | Temp 98.0°F | Resp 14 | Ht 66.0 in | Wt 129.0 lb

## 2021-08-09 DIAGNOSIS — C3432 Malignant neoplasm of lower lobe, left bronchus or lung: Secondary | ICD-10-CM | POA: Insufficient documentation

## 2021-08-09 DIAGNOSIS — Z933 Colostomy status: Secondary | ICD-10-CM

## 2021-08-09 DIAGNOSIS — F32A Depression, unspecified: Secondary | ICD-10-CM | POA: Insufficient documentation

## 2021-08-09 DIAGNOSIS — Z510 Encounter for antineoplastic radiation therapy: Secondary | ICD-10-CM | POA: Insufficient documentation

## 2021-08-09 NOTE — Progress Notes (Signed)
Pharmacist Chemotherapy Monitoring - Initial Assessment    Anticipated start date: 08/17/21   The following has been reviewed per standard work regarding the patient's treatment regimen: The patient's diagnosis, treatment plan and drug doses, and organ/hematologic function Lab orders and baseline tests specific to treatment regimen  The treatment plan start date, drug sequencing, and pre-medications Prior authorization status  Patient's documented medication list, including drug-drug interaction screen and prescriptions for anti-emetics and supportive care specific to the treatment regimen The drug concentrations, fluid compatibility, administration routes, and timing of the medications to be used The patient's access for treatment and lifetime cumulative dose history, if applicable  The patient's medication allergies and previous infusion related reactions, if applicable   Changes made to treatment plan:  N/A  Follow up needed:  N/A   Larene Beach, RPH, 08/09/2021  11:55 AM

## 2021-08-09 NOTE — Progress Notes (Unsigned)
[facility]   Patient tearful and upset. He has been going through a lot in the last few weeks. He has small cell lung cancer and is getting radiation and a port for chemotherapy. He feels like he is a burden on his family and friends. He says that he has been very down and has had SI at times but is not going to hurt himself today.  His ostomy care has been going well without issues. His midline is healed.   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  He is very depressed and signs key signs of depression. I think he can benefit from counseling and likely medication. Information on depression given.   Plan:  Trilby Leaver with the [facility] is a counselor that will be able to talk to you on Tuesday 5/30/203. Call her at [phone], and get this scheduled.   Will get you referred to [facility] for Counseling/ help with feelings of depression.  Will touch base with Oncology to see if they have resources too. Continue with your ostomy care. Call with issues.  Will plan to see you in October after your treatments to start to discuss options and timing in conjunction with your Oncologist. You will still need a colonoscopy at some point, but would wait to do this until after your chemotherapy treatment too.   Will plan to see in the fall after his treatments. Told him to reach out with any issues or questions.   Future Appointments  Date Time Provider [place]  08/10/2021 12:30 PM [facility] ROOM [facility]MD[facility] None  08/10/2021  2:30 PM WL-IR 1 WL-IR [place]  08/16/2021 12:00 PM [facility] LINAC 4 [facility] None   08/17/2021  8:30 AM [facility] LINAC 3 [facility] None  08/17/2021  9:00 AM [facility] [place] FLUSH [facility]MED[facility] None  08/17/2021  9:40 AM Orson Slick, MD [facility]MED[facility] None  08/17/2021 10:00 AM [facility] INFUSION [facility]MED[facility] None  08/18/2021 12:45 PM [facility] LINAC 3 [facility]RAD[facility] None  08/18/2021  1:30 PM [facility] [place] FLUSH [facility]MEDONC None  08/18/2021  2:30 PM [facility] INFUSION [facility]MED[facility] None  08/19/2021  8:30 AM [facility]RAD[facility] LINAC 3 [facility]RAD[facility] None  08/19/2021  1:30 PM [facility] [place] FLUSH [facility]MED[facility]C None  08/19/2021  2:30 PM [facility] INFUSION [facility]MED[facility] None  08/22/2021  8:30 AM [facility] LINAC 3 [facility]RAD[facility] None  08/23/2021  8:00 AM [facility] LINAC 3 [facility]RAD[facility] None  08/24/2021  4:00 PM [facility] LINAC 3 [facility]RAD[facility] None  08/25/2021  2:45 PM [facility]RAD[facility] LINAC 3 [facility]RAD[facility] None  08/26/2021  3:00 PM [facility] LINAC 3 [facility]-RAD[facility] None  08/29/2021  3:00 PM [facility] LINAC 3 [facility]RAD[facility] None  08/30/2021  9:30 AM [facility] LINAC 3 [facility]RAD[facility] None  08/31/2021  9:30 AM [facility]RAD[facility] LINAC 3 [facility]-RAD[facility]C None  08/31/2021 10:30 AM Dinger, Su [place], RD CHCC-ACC None  09/01/2021  9:30 AM [facility]RAD[facility] LINAC 3 [facility]RAD[facility] None  09/02/2021  9:30 AM [facility] LINAC 3 [facility]-RAD[facility] None  09/02/2021 10:00 AM [facility] PFT ROOM [facility]UL[facility] None  09/02/2021 11:00 AM Margaretha Seeds, MD [facility]UL[facility]ARE None  09/05/2021  9:30 AM [facility] LINAC 3  [facility]-RADON[facility] None  09/06/2021  8:00 AM AP-DOIBP PAT 3 AP-DOIBP None  09/06/2021  9:30 AM [facility]RAD[facility] LINAC 3 [facility]RAD[facility] None  09/07/2021  9:30 AM [facility] LINAC 3 [facility]-RADONC None  09/08/2021  9:30 AM [facility] LINAC 3 [facility]RAD[facility] None  09/09/2021  9:30 AM [facility] LINAC 3 [facility]RAD[facility] None  09/12/2021  9:30 AM [facility] LINAC 3 [facility]RAD[facility] None  09/13/2021  9:30 AM [facility] LINAC 3 [facility]RAD[facility] None  09/14/2021  9:30 AM [facility] LINAC 3 [facility]RAD[facility] None  09/15/2021  9:30 AM [facility]RAD[facility] LINAC 3 [facility]-RADONC None  09/16/2021  9:30 AM [facility]RAD[facility]C LINAC 3  [facility]-RADONC None  09/19/2021  9:30 AM [facility]RAD[facility]C LINAC 3 [facility]RAD[facility] None  09/21/2021  9:30 AM [facility]RAD[facility] LINAC 3 [facility]-RADONC None  09/22/2021  9:30 AM [facility]RAD[facility] LINAC 3 [facility]-RAD[facility]C None  09/23/2021  9:30 AM [facility]RAD[facility] LINAC 3 [facility]-RAD[facility]C None  09/26/2021  9:30 AM [facility]RAD[facility] LINAC 3 [facility]-RADONC None  09/27/2021  9:30 AM [facility] LINAC 3 [facility]-RAD[facility]C None  09/28/2021  9:30 AM [facility] LINAC 3 [facility]RAD[facility] None  09/29/2021  9:30 AM [facility] LINAC 3 [facility]RAD[facility] None  09/30/2021  9:30 AM [facility] LINAC 3 [facility]RAD[facility] None  10/25/2021  2:00 PM Margaretha Seeds, MD [facility]PULCARE None  12/20/2021 10:15 AM Virl Cagey, MD RS-RS None   Curlene Labrum, MD [facility] [address] [place], [place] [address] [phone] (office)

## 2021-08-09 NOTE — Patient Instructions (Addendum)
Dakota Bryant with the [facility] is a counselor that will be able to talk to you on Tuesday 5/30/203. Call her at [phone], and get this scheduled.    Will get you referred to [facility] for Counseling/ help with feelings of depression.  Will touch base with Oncology to see if they have resources too. Continue with your ostomy care. Call with issues.  Will plan to see you in October after your treatments to start to discuss options and timing in conjunction with your Oncologist. You will still need a colonoscopy at some point, but would wait to do this until after your chemotherapy treatment too.   Managing Depression, Adult Depression is a mental health condition that affects your thoughts, feelings, and actions. Being diagnosed with depression can bring you relief if you did not know why you have felt or behaved a certain way. It could also leave you feeling overwhelmed with uncertainty about your future. Preparing yourself to manage your symptoms can help you feel more positive about your future. How to manage lifestyle changes Managing stress  Stress is your body's reaction to life changes and events, both good and bad. Stress can add to your feelings of depression. Learning to manage your stress can help lessen your feelings of depression. Try some of the following approaches to reducing your stress (stress reduction techniques): Listen to music that you enjoy and that inspires you. Try using a meditation app or take a meditation class. Develop a practice that helps you connect with your spiritual self. Walk in nature, pray, or go to a place of worship. Do some deep breathing. To do this, inhale slowly through your nose. Pause at the top of your inhale for a few seconds and then exhale slowly, letting your muscles relax. Practice yoga to help relax and work your muscles. Choose a stress reduction technique that suits your lifestyle and personality. These techniques take time and  practice to develop. Set aside 5-15 minutes a day to do them. Therapists can offer training in these techniques. Other things you can do to manage stress include: Keeping a stress diary. Knowing your limits and saying no when you think something is too much. Paying attention to how you react to certain situations. You may not be able to control everything, but you can change your reaction. Adding humor to your life by watching funny films or TV shows. Making time for activities that you enjoy and that relax you.  Medicines Medicines, such as antidepressants, are often a part of treatment for depression. Talk with your pharmacist or health care provider about all the medicines, supplements, and herbal products that you take, their possible side effects, and what medicines and other products are safe to take together. Make sure to report any side effects you may have to your health care provider. Relationships Your health care provider may suggest family therapy, couples therapy, or individual therapy as part of your treatment. How to recognize changes Everyone responds differently to treatment for depression. As you recover from depression, you may start to: Have more interest in doing activities. Feel less hopeless. Have more energy. Overeat less often, or have a better appetite. Have better mental focus. It is important to recognize if your depression is not getting better or is getting worse. The symptoms you had in the beginning may return, such as: Tiredness (fatigue) or low energy. Eating too much or too little. Sleeping too much or too little. Feeling restless, agitated, or hopeless. Trouble focusing or  making decisions. Unexplained physical complaints. Feeling irritable, angry, or aggressive. If you or your family members notice these symptoms coming back, let your health care provider know right away. Follow these instructions at home: Activity  Try to get some form of  exercise each day, such as walking, biking, swimming, or lifting weights. Practice stress reduction techniques. Engage your mind by taking a class or doing some volunteer work. Lifestyle Get the right amount and quality of sleep. Cut down on using caffeine, tobacco, alcohol, and other potentially harmful substances. Eat a healthy diet that includes plenty of vegetables, fruits, whole grains, low-fat dairy products, and lean protein. Do not eat a lot of foods that are high in solid fats, added sugars, or salt (sodium). General instructions Take over-the-counter and prescription medicines only as told by your health care provider. Keep all follow-up visits as told by your health care provider. This is important. Where to find support Talking to others  Friends and family members can be sources of support and guidance. Talk to trusted friends or family members about your condition. Explain your symptoms to them, and let them know that you are working with a health care provider to treat your depression. Tell friends and family members how they also can be helpful. Finances Find appropriate mental health providers that fit with your financial situation. Talk with your health care provider about options to get reduced prices on your medicines. Where to find more information You can find support in your area from: Anxiety and Depression Association of America (ADAA): www.adaa.org Mental Health America: www.mentalhealthamerica.net [employer] on Mental Illness: www.nami.org Contact a health care provider if: You stop taking your antidepressant medicines, and you have any of these symptoms: Nausea. Headache. Light-headedness. Chills and body aches. Not being able to sleep (insomnia). You or your friends and family think your depression is getting worse. Get help right away if: You have thoughts of hurting yourself or others. If you ever feel like you may hurt yourself or others, or have  thoughts about taking your own life, get help right away. Go to your nearest emergency department or: Call your local emergency services (911 in the [place] Call a suicide crisis helpline, such as the [place] at 580-821-6485 or 988 in the [place]. This is open 24 hours a day in the [place] Text the Crisis Text Line at 657 723 6170 (in the [place].). Summary If you are diagnosed with depression, preparing yourself to manage your symptoms is a good way to feel positive about your future. Work with your health care provider on a management plan that includes stress reduction techniques, medicines (if applicable), therapy, and healthy lifestyle habits. Keep talking with your health care provider about how your treatment is working. If you have thoughts about taking your own life, call a suicide crisis helpline or text a crisis text line. This information is not intended to replace advice given to you by your health care provider. Make sure you discuss any questions you have with your health care provider. Document Revised: 09/29/2020 Document Reviewed: 01/15/2019 Elsevier Patient Education  [place].

## 2021-08-10 ENCOUNTER — Ambulatory Visit (HOSPITAL_COMMUNITY)
Admission: RE | Admit: 2021-08-10 | Discharge: 2021-08-10 | Disposition: A | Discharge status: Home / Self Care | Payer: Medicaid Other | Source: Ambulatory Visit | Attending: Hematology and Oncology | Admitting: Hematology and Oncology

## 2021-08-10 ENCOUNTER — Telehealth: Payer: Self-pay | Admitting: *Deleted

## 2021-08-10 DIAGNOSIS — I10 Essential (primary) hypertension: Secondary | ICD-10-CM | POA: Diagnosis not present

## 2021-08-10 DIAGNOSIS — C3492 Malignant neoplasm of unspecified part of left bronchus or lung: Secondary | ICD-10-CM | POA: Insufficient documentation

## 2021-08-10 DIAGNOSIS — F17210 Nicotine dependence, cigarettes, uncomplicated: Secondary | ICD-10-CM | POA: Insufficient documentation

## 2021-08-10 DIAGNOSIS — R918 Other nonspecific abnormal finding of lung field: Secondary | ICD-10-CM

## 2021-08-10 DIAGNOSIS — K219 Gastro-esophageal reflux disease without esophagitis: Secondary | ICD-10-CM | POA: Diagnosis not present

## 2021-08-10 DIAGNOSIS — J449 Chronic obstructive pulmonary disease, unspecified: Secondary | ICD-10-CM | POA: Diagnosis not present

## 2021-08-10 HISTORY — PX: IR IMAGING GUIDED PORT INSERTION: IMG5740

## 2021-08-10 MED ORDER — FENTANYL CITRATE (PF) 100 MCG/2ML IJ SOLN
INTRAMUSCULAR | Status: AC | PRN
  Administered 2021-08-10: 50 ug via INTRAVENOUS

## 2021-08-10 MED ORDER — MIDAZOLAM HCL 2 MG/2ML IJ SOLN
INTRAMUSCULAR | Status: AC | PRN
Start: 2021-08-10 — End: 2021-08-10
  Administered 2021-08-10: 1 mg via INTRAVENOUS

## 2021-08-10 MED ORDER — SODIUM CHLORIDE 0.9 % IV SOLN: INTRAVENOUS | Status: DC

## 2021-08-10 MED ORDER — MIDAZOLAM HCL 2 MG/2ML IJ SOLN
INTRAMUSCULAR | Status: AC
  Filled 2021-08-10: qty 2

## 2021-08-10 MED ORDER — HEPARIN SOD (PORK) LOCK FLUSH 100 UNIT/ML IV SOLN
INTRAVENOUS | Status: AC | PRN
  Administered 2021-08-10: 500 [IU] via INTRAVENOUS

## 2021-08-10 MED ORDER — FENTANYL CITRATE (PF) 100 MCG/2ML IJ SOLN
INTRAMUSCULAR | Status: AC
  Filled 2021-08-10: qty 4

## 2021-08-10 MED ORDER — LIDOCAINE-EPINEPHRINE 1 %-1:100000 IJ SOLN
INTRAMUSCULAR | Status: AC
  Filled 2021-08-10: qty 1

## 2021-08-10 MED ORDER — FENTANYL CITRATE (PF) 100 MCG/2ML IJ SOLN
INTRAMUSCULAR | Status: AC | PRN
Start: 2021-08-10 — End: 2021-08-10
  Administered 2021-08-10: 50 ug via INTRAVENOUS

## 2021-08-10 MED ORDER — LIDOCAINE HCL 1 % IJ SOLN
INTRAMUSCULAR | Status: AC
  Filled 2021-08-10: qty 20

## 2021-08-10 MED ORDER — HEPARIN SOD (PORK) LOCK FLUSH 100 UNIT/ML IV SOLN
INTRAVENOUS | Status: AC
  Filled 2021-08-10: qty 5

## 2021-08-10 MED ORDER — MIDAZOLAM HCL 2 MG/2ML IJ SOLN
INTRAMUSCULAR | Status: AC | PRN
  Administered 2021-08-10: 1 mg via INTRAVENOUS

## 2021-08-10 MED ORDER — LIDOCAINE HCL (PF) 1 % IJ SOLN
INTRAMUSCULAR | Status: AC | PRN
  Administered 2021-08-10: 10 mL via INTRADERMAL

## 2021-08-10 MED ORDER — LIDOCAINE-EPINEPHRINE 1 %-1:100000 IJ SOLN
INTRAMUSCULAR | Status: AC | PRN
  Administered 2021-08-10: 10 mL via INTRADERMAL

## 2021-08-10 NOTE — Procedures (Signed)
  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  Frazier Richards, MD Main # [phone] Mobile [phone]

## 2021-08-10 NOTE — Consult Note (Signed)
Chief Complaint: Patient was seen in consultation today for Port-A-Cath placement  Referring Physician(s): Dorsey,John T IV  Supervising Physician: Delma Post  Patient Status: Dakota Bryant - Out-pt  History of Present Illness: Dakota Bryant is a 51 y.o. 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  He is scheduled today for Port-A-Cath placement to assist with treatment.  Past Medical History:  Diagnosis Date   Acid reflux    Colostomy in place [facility])    Emphysema lung ([place])    Hypertension    Mass of left lung    Perforated diverticulum     Past Surgical History:  Procedure Laterality Date   BRONCHIAL BRUSHINGS  07/26/2021   Procedure: BRONCHIAL BRUSHINGS;  Surgeon: Margaretha Seeds, MD;  Location: [facility];  Service: Cardiopulmonary;;   BRONCHIAL WASHINGS  07/26/2021   Procedure: BRONCHIAL WASHINGS;  Surgeon: Margaretha Seeds, MD;  Location: [facility];  Service: Cardiopulmonary;;   COLOSTOMY N/A 06/03/2021   Procedure: SIGMOID COLECTOMY WITH COLOSTOMY;  Surgeon: Virl Cagey, MD;  Location: [facility];  Service: General;  Laterality: N/A;   ENDOBRONCHIAL ULTRASOUND Bilateral 07/26/2021   Procedure: ENDOBRONCHIAL ULTRASOUND;  Surgeon: Margaretha Seeds, MD;  Location: [facility];  Service: Cardiopulmonary;  Laterality: Bilateral;   FINE NEEDLE ASPIRATION  07/26/2021   Procedure: FINE NEEDLE ASPIRATION;  Surgeon: Margaretha Seeds, MD;  Location: [facility];  Service: Cardiopulmonary;;   HAND TENDON SURGERY     KNEE ARTHROSCOPY Left    VIDEO BRONCHOSCOPY  07/26/2021   Procedure: VIDEO BRONCHOSCOPY WITHOUT FLUORO;  Surgeon: Margaretha Seeds, MD;  Location: [facility];  Service: Cardiopulmonary;;    Allergies: Patient has no known allergies.  Medications: Prior to Admission medications   Medication Sig Start  Date End Date Taking? Authorizing Provider  acetaminophen (TYLENOL) 500 MG tablet Take 1,000 mg by mouth every 8 (eight) hours as needed for headache or moderate pain.    [provider]  amLODipine (NORVASC) 10 MG tablet Take 1 tablet (10 mg total) by mouth daily. 06/10/21   Roxan Hockey, MD  amoxicillin-clavulanate (AUGMENTIN) 875-125 MG tablet Take 1 tablet by mouth 2 (two) times daily. 08/08/21   Tyler Pita, MD  cetirizine (ZYRTEC ALLERGY) 10 MG tablet Take 1 tablet (10 mg total) by mouth daily. 08/02/21   Orson Slick, MD  hydrOXYzine (ATARAX) 25 MG tablet Take 1 tablet (25 mg total) by mouth every 6 (six) hours as needed for anxiety or nausea. 06/16/21   Virl Cagey, MD  labetalol (NORMODYNE) 200 MG tablet Take 1 tablet (200 mg total) by mouth 2 (two) times daily. 06/09/21   Roxan Hockey, MD  lidocaine-prilocaine (EMLA) cream Apply 1 application. topically as needed. 08/08/21   Orson Slick, MD  Multiple Vitamin (MULTIVITAMIN WITH MINERALS) TABS tablet Take 1 tablet by mouth daily. 06/10/21   Roxan Hockey, MD  naphazoline-pheniramine (VISINE) 0.025-0.3 % ophthalmic solution Place 1 drop into both eyes 4 (four) times daily as needed for eye irritation.    [provider]  omeprazole (PRILOSEC) 40 MG capsule Take 1 capsule (40 mg total) by mouth daily. 07/25/21   Carlan, Chelsea L, NP  ondansetron (ZOFRAN) 8 MG tablet Take 1 tablet (8 mg total) by mouth every 8 (eight) hours as needed. 08/02/21   Orson Slick, MD  oxyCODONE (ROXICODONE) 5 MG immediate  release tablet Take 1 tablet (5 mg total) by mouth every 4 (four) hours as needed for severe pain or breakthrough pain. No alcohol while taking this Patient not taking: Reported on 08/09/2021 07/05/21   Virl Cagey, MD  pantoprazole (PROTONIX) 40 MG tablet Take 40 mg by mouth daily. 08/02/21   [provider]  polyethylene glycol-electrolytes (TRILYTE) 420 g solution Take 4,000 mLs by mouth  as directed. 08/02/21   Harvel Quale, MD  prochlorperazine (COMPAZINE) 10 MG tablet Take 1 tablet (10 mg total) by mouth every 6 (six) hours as needed for nausea or vomiting. 08/02/21   Orson Slick, MD  tamsulosin (FLOMAX) 0.4 MG CAPS capsule Take 1 capsule (0.4 mg total) by mouth daily. 06/15/21   Virl Cagey, MD  traZODone (DESYREL) 50 MG tablet Take 1 tablet (50 mg total) by mouth at bedtime. Patient not taking: Reported on 08/09/2021 06/09/21   Roxan Hockey, MD  umeclidinium-vilanterol [facility] ELLIPTA) 62.5-25 MCG/ACT AEPB Inhale 1 puff into the lungs daily. 07/12/21   Margaretha Seeds, MD     No family history on file.  Social History   Socioeconomic History   Marital status: Divorced    Spouse name: Not on file   Number of children: Not on file   Years of education: Not on file   Highest education level: Not on file  Occupational History   Not on file  Tobacco Use   Smoking status: Every Day    Packs/day: 1.00    Years: 1.00    Pack years: 1.00    Types: Cigarettes    Passive exposure: Current   Smokeless tobacco: Never   Tobacco comments:    Started smoking pack a day since 14  Vaping Use   Vaping Use: Never used  Substance and Sexual Activity   Alcohol use: Yes    Alcohol/week: 2.0 standard drinks    Types: 2 Cans of beer per week    Comment: occasional   Drug use: Yes    Types: Marijuana    Comment: occ   Sexual activity: Not on file  Other Topics Concern   Not on file  Social History Narrative   Not on file   Social Determinants of Health   Financial Resource Strain: Not on file  Food Insecurity: Not on file  Transportation Needs: Not on file  Physical Activity: Not on file  Stress: Not on file  Social Connections: Not on file      Review of Systems currently denies fever, chest pain, sitting dyspnea, nausea, vomiting or bleeding.  He does have occasional headaches, cough, as well as abdominal and back discomfort  Vital  Signs: BP 109/71 (BP Location: Right Arm)   Pulse 74   Temp 98.4 F (36.9 C) (Oral)   Resp 15   SpO2 100%   Physical Exam awake, alert.  Chest clear to auscultation bilaterally.  Heart with regular rate and rhythm.  Abdomen soft, colostomy in place, mild tenderness to palpation.  No significant lower extremity edema.  Imaging: MR BRAIN W WO CONTRAST  Result Date: 08/08/2021 CLINICAL DATA:  Small cell lung cancer staging. EXAM: MRI HEAD WITHOUT AND WITH CONTRAST TECHNIQUE: Multiplanar, multiecho pulse sequences of the brain and surrounding structures were obtained without and with intravenous contrast. CONTRAST:  28mL GADAVIST GADOBUTROL 1 MMOL/ML IV SOLN COMPARISON:  None Available. FINDINGS: Brain: There is no evidence of an acute infarct, intracranial hemorrhage, mass, midline shift, or extra-axial fluid collection. The ventricles  and sulci are normal. Small T2 hyperintensities in the cerebral white matter bilaterally are nonspecific but compatible with mild chronic small vessel ischemic disease. No abnormal enhancement is identified. Vascular: Major intracranial vascular flow voids are preserved. Skull and upper cervical spine: Unremarkable bone marrow signal. Sinuses/Orbits: Unremarkable orbits. Mild mucosal thickening in the left maxillary sinus. Trace bilateral mastoid effusions. Other: None. IMPRESSION: 1. No evidence of intracranial metastases. 2. Mild chronic small vessel ischemic disease. Electronically Signed   By: Logan Bores M.D.   On: 08/08/2021 09:48   NM PET Image Initial (PI) Skull Base To Thigh  Result Date: 07/18/2021 CLINICAL DATA:  Initial treatment strategy for left lung mass. History of colectomy and colostomy on 06/03/2021. EXAM: NUCLEAR MEDICINE PET SKULL BASE TO THIGH TECHNIQUE: 6.3 mCi F-18 FDG was injected intravenously. Full-ring PET imaging was performed from the skull base to thigh after the radiotracer. CT data was obtained and used for attenuation correction and  anatomic localization. Fasting blood glucose: 100 mg/dl COMPARISON:  CT chest 06/08/2021, CT abdomen pelvis 06/03/2021, CT chest 05/27/2018. FINDINGS: Mediastinal blood pool activity: SUV max 1.7 Liver activity: SUV max NA NECK: Intense uptake in the mouth may be due to salivary pooling. No abnormal hypermetabolism. Incidental CT findings: None. CHEST: Hypermetabolic low right paratracheal and right hilar lymph nodes. Low right paratracheal lymph node measures 13 mm with an SUV max 8.0. Central left lower lobe mass measures 2.0 x 3.1 cm (7/50), SUV max 14.0. 5 mm right upper lobe nodule (7/26), SUV max 1.0. Incidental CT findings: Left anterior descending coronary artery calcification. Heart size normal. No pericardial or pleural effusion. ABDOMEN/PELVIS: No abnormal hypermetabolism in the liver, adrenal glands, spleen or pancreas. Focal hypermetabolism is seen at the suture line in the rectosigmoid colon, compatible with sigmoid colectomy on 06/03/2021. No hypermetabolic lymph nodes. Incidental CT findings: Liver, gallbladder, adrenal glands and right kidney are unremarkable. Stones in the left kidney. Spleen, pancreas, stomach and small bowel are otherwise unremarkable. Left lower quadrant colostomy. Atherosclerotic calcification of the aorta. SKELETON: No abnormal hypermetabolism. Incidental CT findings: Mild degenerative changes in the spine. IMPRESSION: 1. Hypermetabolic left lower lobe mass with hypermetabolic contralateral mediastinal and right hilar adenopathy. Indeterminate 5 mm right upper lobe nodule. Findings are most indicative of T2aN3Mx or at least stage IIIB disease. 2. Left renal stones. 3. Left anterior descending coronary artery calcification. Electronically Signed   By: Lorin Picket M.D.   On: 07/18/2021 16:12   NM PET SUPER D CT  Result Date: 07/18/2021 CLINICAL DATA:  Left lung mass, weight loss, chest pain. * Tracking Code: BO * EXAM: CT CHEST WITHOUT CONTRAST TECHNIQUE: Multidetector CT  imaging of the chest was performed using thin slice collimation for electromagnetic bronchoscopy planning purposes, without intravenous contrast. RADIATION DOSE REDUCTION: This exam was performed according to the departmental dose-optimization program which includes automated exposure control, adjustment of the mA and/or kV according to patient size and/or use of iterative reconstruction technique. COMPARISON:  PET same day, CT chest 06/08/2021 and 05/27/2018. FINDINGS: Cardiovascular: Heart size normal.  No pericardial effusion. Mediastinum/Nodes: Low right paratracheal lymph node measures 1.2 cm, stable. Hilar regions are difficult to definitively evaluate without IV contrast but a hypermetabolic right hilar intrapulmonary lymph node was seen on today's PET. No axillary adenopathy. Esophagus is grossly unremarkable. Lungs/Pleura: Smoking related respiratory bronchiolitis. Central left lower lobe mass measures 1.9 x 3.1 cm (4/102), as on 06/08/2021. Lungs are otherwise clear. No pleural fluid. Adherent debris in the airway. Upper Abdomen: Visualized  portions of the liver, adrenal glands, kidneys, spleen, pancreas, stomach and bowel are grossly unremarkable. Musculoskeletal: Mild degenerative changes in the spine. No worrisome lytic or sclerotic lesions. IMPRESSION: 1. Left lower lobe mass with contralateral mediastinal and hilar adenopathy, compatible with primary bronchogenic carcinoma. Please refer to today's PET for staging. 2. 5 mm right upper lobe nodule, indeterminate. Recommend attention on follow-up. Electronically Signed   By: Lorin Picket M.D.   On: 07/18/2021 16:18    Labs:  CBC: Recent Labs    06/06/21 0310 06/07/21 0939 06/08/21 0414 08/02/21 1147  WBC 11.3* 10.6* 11.3* 9.3  HGB 13.8 13.9 14.8 15.2  HCT 41.8 42.1 43.5 41.2  PLT 236 251 321 324    COAGS: Recent Labs    06/02/21 0439  INR 1.2    BMP: Recent Labs    06/07/21 0939 06/08/21 0414 06/09/21 0529 08/02/21 1147   NA 137 137 135 124*  K 3.5 3.4* 3.9 4.2  CL 106 107 102 91*  CO2 24 22 25 30   GLUCOSE 92 96 95 138*  BUN 8 7 5* <5*  CALCIUM 7.9* 7.7* 8.3* 9.1  CREATININE 0.59* 0.57* 0.62 0.71  GFRNONAA >60 >60 >60 >60    LIVER FUNCTION TESTS: Recent Labs    06/01/21 2131 06/07/21 0939 06/09/21 0529 08/02/21 1147  BILITOT 1.8* 0.7  --  0.6  AST 14* 17  --  13*  ALT 13 12  --  12  ALKPHOS 57 33*  --  52  PROT 7.6 4.9*  --  6.7  ALBUMIN 4.3 2.4* 2.8* 4.3    TUMOR MARKERS: No results for input(s): AFPTM, CEA, CA199, CHROMGRNA in the last 8760 hours.  Assessment and Plan: 51 y.o. 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  He is scheduled today for Port-A-Cath placement to assist with treatment.Risks and benefits of image guided port-a-catheter placement was discussed with the patient including, but not limited to bleeding, infection, pneumothorax, or fibrin sheath development and need for additional procedures.  All of the patient's questions were answered, patient is agreeable to proceed. Consent signed and in chart.    Thank you for this interesting consult.  I greatly enjoyed meeting Dakota Bryant and look forward to participating in their care.  A copy of this report was sent to the requesting provider on this date.  Electronically Signed: D. Rowe Robert, PA-C 08/10/2021, 1:21 PM   I spent a total of  25 minutes   in face to face in clinical consultation, greater than 50% of which was counseling/coordinating care for Port-A-Cath placement

## 2021-08-10 NOTE — Discharge Instructions (Signed)
Please call Interventional Radiology clinic [phone] with any questions or concerns.  You may remove your dressing and shower tomorrow.  DO NOT use EMLA cream on your port site for 2 weeks as this cream will remove surgical glue on your incision.  Implanted Port Insertion, Care After This sheet gives you information about how to care for yourself after your procedure. Your health care provider may also give you more specific instructions. If you have problems or questions, contact your health care provider. What can I expect after the procedure? After the procedure, it is common to have: Discomfort at the port insertion site. Bruising on the skin over the port. This should improve over 3-4 days. Follow these instructions at home: [facility] care After your port is placed, you will get a manufacturer's information card. The card has information about your port. Keep this card with you at all times. Take care of the port as told by your health care provider. Ask your health care provider if you or a family member can get training for taking care of the port at home. A home health care nurse may also take care of the port. Make sure to remember what type of port you have. Incision care Follow instructions from your health care provider about how to take care of your port insertion site. Make sure you: Wash your hands with soap and water before and after you change your bandage (dressing). If soap and water are not available, use hand sanitizer. Change your dressing as told by your health care provider. Leave stitches (sutures), skin glue, or adhesive strips in place. These skin closures may need to stay in place for 2 weeks or longer. If adhesive strip edges start to loosen and curl up, you may trim the loose edges. Do not remove adhesive strips completely unless your health care provider tells you to do that. Check your port insertion site every day for signs of infection. Check for: Redness,  swelling, or pain. Fluid or blood. Warmth. Pus or a bad smell.        Activity Return to your normal activities as told by your health care provider. Ask your health care provider what activities are safe for you. Do not lift anything that is heavier than 10 lb (4.5 kg), or the limit that you are told, until your health care provider says that it is safe. General instructions Take over-the-counter and prescription medicines only as told by your health care provider. Do not take baths, swim, or use a hot tub until your health care provider approves. Ask your health care provider if you may take showers. You may only be allowed to take sponge baths. Do not drive for 24 hours if you were given a sedative during your procedure. Wear a medical alert bracelet in case of an emergency. This will tell any health care providers that you have a port. Keep all follow-up visits as told by your health care provider. This is important. Contact a health care provider if: You cannot flush your port with saline as directed, or you cannot draw blood from the port. You have a fever or chills. You have redness, swelling, or pain around your port insertion site. You have fluid or blood coming from your port insertion site. Your port insertion site feels warm to the touch. You have pus or a bad smell coming from the port insertion site. Get help right away if: You have chest pain or shortness of breath. You have bleeding from  your port that you cannot control. Summary Take care of the port as told by your health care provider. Keep the manufacturer's information card with you at all times. Change your dressing as told by your health care provider. Contact a health care provider if you have a fever or chills or if you have redness, swelling, or pain around your port insertion site. Keep all follow-up visits as told by your health care provider. This information is not intended to replace advice given to you  by your health care provider. Make sure you discuss any questions you have with your health care provider. Document Revised: 10/02/2017 Document Reviewed: 10/02/2017 Elsevier Patient Education  2021 [place].   Moderate Conscious Sedation, Adult, Care After This sheet gives you information about how to care for yourself after your procedure. Your health care provider may also give you more specific instructions. If you have problems or questions, contact your health care provider. What can I expect after the procedure? After the procedure, it is common to have: Sleepiness for several hours. Impaired judgment for several hours. Difficulty with balance. Vomiting if you eat too soon. Follow these instructions at home: For the time period you were told by your health care provider: Rest. Do not participate in activities where you could fall or become injured. Do not drive or use machinery. Do not drink alcohol. Do not take sleeping pills or medicines that cause drowsiness. Do not make important decisions or sign legal documents. Do not take care of children on your own.        Eating and drinking Follow the diet recommended by your health care provider. Drink enough fluid to keep your urine pale yellow. If you vomit: Drink water, juice, or soup when you can drink without vomiting. Make sure you have little or no nausea before eating solid foods.    General instructions Take over-the-counter and prescription medicines only as told by your health care provider. Have a responsible adult stay with you for the time you are told. It is important to have someone help care for you until you are awake and alert. Do not smoke. Keep all follow-up visits as told by your health care provider. This is important. Contact a health care provider if: You are still sleepy or having trouble with balance after 24 hours. You feel light-headed. You keep feeling nauseous or you keep vomiting. You  develop a rash. You have a fever. You have redness or swelling around the IV site. Get help right away if: You have trouble breathing. You have new-onset confusion at home. Summary After the procedure, it is common to feel sleepy, have impaired judgment, or feel nauseous if you eat too soon. Rest after you get home. Know the things you should not do after the procedure. Follow the diet recommended by your health care provider and drink enough fluid to keep your urine pale yellow. Get help right away if you have trouble breathing or new-onset confusion at home. This information is not intended to replace advice given to you by your health care provider. Make sure you discuss any questions you have with your health care provider. Document Revised: 07/04/2019 Document Reviewed: 01/30/2019 Elsevier Patient Education  2021 [employer].

## 2021-08-10 NOTE — Telephone Encounter (Signed)
Call placed to [facility] [phone]) [phone]~ telephone to follow up on urgent referral.   Was advised that referral would need to be sent to [facility] [phone]~ telephone, [address] [place]. Was advised that referral will be sent to them for review.   Call placed to patient to make aware of changes. Was advised that patient is currently at [facility] at another appointment.

## 2021-08-11 ENCOUNTER — Telehealth: Payer: Self-pay | Admitting: *Deleted

## 2021-08-11 ENCOUNTER — Other Ambulatory Visit (INDEPENDENT_AMBULATORY_CARE_PROVIDER_SITE_OTHER): Payer: Self-pay | Admitting: *Deleted

## 2021-08-11 DIAGNOSIS — C3432 Malignant neoplasm of lower lobe, left bronchus or lung: Secondary | ICD-10-CM | POA: Diagnosis not present

## 2021-08-11 DIAGNOSIS — Z933 Colostomy status: Secondary | ICD-10-CM

## 2021-08-11 DIAGNOSIS — Z510 Encounter for antineoplastic radiation therapy: Secondary | ICD-10-CM | POA: Diagnosis not present

## 2021-08-11 MED ORDER — OXYCODONE HCL 5 MG PO TABS: 5.0000 mg | ORAL_TABLET | ORAL | 0 refills | Status: DC | PRN

## 2021-08-11 MED ORDER — HYDROXYZINE HCL 25 MG PO TABS: 25.0000 mg | ORAL_TABLET | Freq: Four times a day (QID) | ORAL | 0 refills | Status: DC | PRN

## 2021-08-11 NOTE — Telephone Encounter (Signed)
Received call from patient regarding the port that was placed yesterday. He states there is a lot of bruising at his right neck and right chest. He states it was pretty sore last night. Advised to use ice packs on the sites and take tylenol 2 tabs every 6 hours as needed for the pain. He states he cannot take ibuprofen.  Assured pt that this was not unusual after port placement. Pt voiced understanding and is aware of his 1st chemo appt next week. He has had his patient education class.

## 2021-08-11 NOTE — Telephone Encounter (Signed)
Patient returned call and made aware of referral information. Also advised that [facility] does have an urgent care with walk in appointments for urgent issues.   Patient states that he feels much better today. States that he was overwhelmed with diagnosis and treatment plan. States that once he was able to sit with it, he was able to come to terms with current situation. States that he will follow up with SW at Oncology and keep information if needed.   Dr. Constance Haw made aware.

## 2021-08-11 NOTE — Telephone Encounter (Signed)
Call placed to patient and patient made aware.  

## 2021-08-11 NOTE — Telephone Encounter (Signed)
[facility]  Unfortunately I cannot increase the dosing now since he is so far out from surgery. I have refilled the meds and if he is requiring pain meds for other pain outside of the colostomy he needs to talk to the Oncologist.  The colostomy pain should be controlled by tylenol and ibuprofen at this time.   I have done a last time refill of oxycodone 5 mg q 4 PRN # 20 and of the hydroxyzine 25 mg q 6 PRN # 30.   If he needs more of these types of meds right now, he needs to get them from the Oncologist since they will be seeing him more regularly and know about any interactions.   Curlene Labrum, MD [facility] [address] [place], [place] [address] [phone] (office)

## 2021-08-11 NOTE — Telephone Encounter (Signed)
Received call from patient.   Requested refills on pain, nausea and anxiety medication.   Call placed to patient to confirm requested prescriptions. Patient also requested to increase dose of pain medication as he is currently taking (2) tabs when needed.   Oxycodone 5mg   Last Refill: 07/05/2021, #20- Dr. Constance Haw PMP reviewed.   Hydroxyzine 25mg  Last Refill: 06/16/2021, #30- Dr. Constance Haw  Pharmacy: [facility]

## 2021-08-16 ENCOUNTER — Other Ambulatory Visit: Payer: Self-pay

## 2021-08-16 ENCOUNTER — Ambulatory Visit
Admission: RE | Admit: 2021-08-16 | Discharge: 2021-08-16 | Disposition: A | Discharge status: Home / Self Care | Payer: Medicaid Other | Source: Ambulatory Visit | Attending: Radiation Oncology | Admitting: Radiation Oncology

## 2021-08-16 DIAGNOSIS — Z510 Encounter for antineoplastic radiation therapy: Secondary | ICD-10-CM | POA: Diagnosis not present

## 2021-08-16 DIAGNOSIS — C3432 Malignant neoplasm of lower lobe, left bronchus or lung: Secondary | ICD-10-CM | POA: Diagnosis not present

## 2021-08-16 LAB — RAD ONC ARIA SESSION SUMMARY
Course Elapsed Days: 0
Plan Fractions Treated to Date: 1
Plan Prescribed Dose Per Fraction: 2 Gy
Plan Total Fractions Prescribed: 33
Plan Total Prescribed Dose: 66 Gy
Reference Point Dosage Given to Date: 2 Gy
Reference Point Session Dosage Given: 2 Gy
Session Number: 1

## 2021-08-16 MED FILL — Fosaprepitant Dimeglumine For IV Infusion 150 MG (Base Eq): INTRAVENOUS | Qty: 5 | Status: AC

## 2021-08-16 MED FILL — Dexamethasone Sodium Phosphate Inj 100 MG/10ML: INTRAMUSCULAR | Qty: 1 | Status: AC

## 2021-08-17 ENCOUNTER — Ambulatory Visit
Admission: RE | Admit: 2021-08-17 | Discharge: 2021-08-17 | Disposition: A | Discharge status: Home / Self Care | Payer: Medicaid Other | Source: Ambulatory Visit | Attending: Radiation Oncology | Admitting: Radiation Oncology

## 2021-08-17 ENCOUNTER — Other Ambulatory Visit: Payer: Self-pay

## 2021-08-17 ENCOUNTER — Inpatient Hospital Stay (HOSPITAL_BASED_OUTPATIENT_CLINIC_OR_DEPARTMENT_OTHER): Payer: Medicaid Other | Admitting: Hematology and Oncology

## 2021-08-17 ENCOUNTER — Encounter: Payer: Self-pay | Admitting: General Practice

## 2021-08-17 ENCOUNTER — Inpatient Hospital Stay: Payer: Medicaid Other

## 2021-08-17 ENCOUNTER — Other Ambulatory Visit: Payer: Self-pay | Admitting: Hematology and Oncology

## 2021-08-17 VITALS — BP 108/75 | HR 71 | Temp 98.4°F | Resp 16 | Ht 66.0 in | Wt 129.4 lb

## 2021-08-17 DIAGNOSIS — C3490 Malignant neoplasm of unspecified part of unspecified bronchus or lung: Secondary | ICD-10-CM

## 2021-08-17 DIAGNOSIS — C3432 Malignant neoplasm of lower lobe, left bronchus or lung: Secondary | ICD-10-CM

## 2021-08-17 DIAGNOSIS — Z510 Encounter for antineoplastic radiation therapy: Secondary | ICD-10-CM | POA: Diagnosis not present

## 2021-08-17 DIAGNOSIS — R918 Other nonspecific abnormal finding of lung field: Secondary | ICD-10-CM

## 2021-08-17 DIAGNOSIS — Z5111 Encounter for antineoplastic chemotherapy: Secondary | ICD-10-CM | POA: Diagnosis not present

## 2021-08-17 LAB — CBC WITH DIFFERENTIAL (CANCER CENTER ONLY)
Abs Immature Granulocytes: 0.03 10*3/uL (ref 0.00–0.07)
Basophils Absolute: 0.1 10*3/uL (ref 0.0–0.1)
Basophils Relative: 1 %
Eosinophils Absolute: 0.1 10*3/uL (ref 0.0–0.5)
Eosinophils Relative: 1 %
HCT: 38.8 % — ABNORMAL LOW (ref 39.0–52.0)
Hemoglobin: 14.4 g/dL (ref 13.0–17.0)
Immature Granulocytes: 0 %
Lymphocytes Relative: 20 %
Lymphs Abs: 1.4 10*3/uL (ref 0.7–4.0)
MCH: 32.4 pg (ref 26.0–34.0)
MCHC: 37.1 g/dL — ABNORMAL HIGH (ref 30.0–36.0)
MCV: 87.4 fL (ref 80.0–100.0)
Monocytes Absolute: 0.7 10*3/uL (ref 0.1–1.0)
Monocytes Relative: 9 %
Neutro Abs: 4.8 10*3/uL (ref 1.7–7.7)
Neutrophils Relative %: 69 %
Platelet Count: 287 10*3/uL (ref 150–400)
RBC: 4.44 MIL/uL (ref 4.22–5.81)
RDW: 12.5 % (ref 11.5–15.5)
WBC Count: 7.1 10*3/uL (ref 4.0–10.5)
nRBC: 0 % (ref 0.0–0.2)

## 2021-08-17 LAB — CMP (CANCER CENTER ONLY)
ALT: 9 U/L (ref 0–44)
AST: 13 U/L — ABNORMAL LOW (ref 15–41)
Albumin: 4.2 g/dL (ref 3.5–5.0)
Alkaline Phosphatase: 50 U/L (ref 38–126)
Anion gap: 4 — ABNORMAL LOW (ref 5–15)
BUN: 5 mg/dL — ABNORMAL LOW (ref 6–20)
CO2: 28 mmol/L (ref 22–32)
Calcium: 9.5 mg/dL (ref 8.9–10.3)
Chloride: 89 mmol/L — ABNORMAL LOW (ref 98–111)
Creatinine: 0.56 mg/dL — ABNORMAL LOW (ref 0.61–1.24)
GFR, Estimated: >60 mL/min (ref >60)
Glucose, Bld: 85 mg/dL (ref 70–99)
Potassium: 4.2 mmol/L (ref 3.5–5.1)
Sodium: 121 mmol/L — ABNORMAL LOW (ref 135–145)
Total Bilirubin: 0.6 mg/dL (ref 0.3–1.2)
Total Protein: 6.7 g/dL (ref 6.5–8.1)

## 2021-08-17 LAB — RAD ONC ARIA SESSION SUMMARY
Course Elapsed Days: 1
Plan Fractions Treated to Date: 2
Plan Prescribed Dose Per Fraction: 2 Gy
Plan Total Fractions Prescribed: 33
Plan Total Prescribed Dose: 66 Gy
Reference Point Dosage Given to Date: 4 Gy
Reference Point Session Dosage Given: 2 Gy
Session Number: 2

## 2021-08-17 LAB — MAGNESIUM: Magnesium: 1.8 mg/dL (ref 1.7–2.4)

## 2021-08-17 MED ORDER — SODIUM CHLORIDE 0.9 % IV SOLN
10.0000 mg | Freq: Once | INTRAVENOUS | Status: AC
  Administered 2021-08-17: 10 mg via INTRAVENOUS
  Filled 2021-08-17: qty 10

## 2021-08-17 MED ORDER — MAGNESIUM SULFATE 2 GM/50ML IV SOLN
2.0000 g | Freq: Once | INTRAVENOUS | Status: AC
  Administered 2021-08-17: 2 g via INTRAVENOUS
  Filled 2021-08-17: qty 50

## 2021-08-17 MED ORDER — HEPARIN SOD (PORK) LOCK FLUSH 100 UNIT/ML IV SOLN
500.0000 [IU] | Freq: Once | INTRAVENOUS | Status: AC | PRN
  Administered 2021-08-17: 500 [IU]

## 2021-08-17 MED ORDER — POTASSIUM CHLORIDE IN NACL 20-0.9 MEQ/L-% IV SOLN
Freq: Once | INTRAVENOUS | Status: AC
  Filled 2021-08-17: qty 1000

## 2021-08-17 MED ORDER — SODIUM CHLORIDE 0.9 % IV SOLN: Freq: Once | INTRAVENOUS | Status: AC

## 2021-08-17 MED ORDER — SODIUM CHLORIDE 0.9 % IV SOLN
100.0000 mg/m2 | Freq: Once | INTRAVENOUS | Status: AC
  Administered 2021-08-17: 160 mg via INTRAVENOUS
  Filled 2021-08-17: qty 8

## 2021-08-17 MED ORDER — SODIUM CHLORIDE 0.9 % IV SOLN
80.0000 mg/m2 | Freq: Once | INTRAVENOUS | Status: AC
  Administered 2021-08-17: 130 mg via INTRAVENOUS
  Filled 2021-08-17: qty 130

## 2021-08-17 MED ORDER — PALONOSETRON HCL INJECTION 0.25 MG/5ML
0.2500 mg | Freq: Once | INTRAVENOUS | Status: AC
  Administered 2021-08-17: 0.25 mg via INTRAVENOUS
  Filled 2021-08-17: qty 5

## 2021-08-17 MED ORDER — SODIUM CHLORIDE 0.9 % IV SOLN
150.0000 mg | Freq: Once | INTRAVENOUS | Status: AC
  Administered 2021-08-17: 150 mg via INTRAVENOUS
  Filled 2021-08-17: qty 150

## 2021-08-17 MED ORDER — SODIUM CHLORIDE 0.9% FLUSH
10.0000 mL | INTRAVENOUS | Status: DC | PRN
  Administered 2021-08-17: 10 mL

## 2021-08-17 MED FILL — Dexamethasone Sodium Phosphate Inj 100 MG/10ML: INTRAMUSCULAR | Qty: 1 | Status: AC

## 2021-08-17 NOTE — Progress Notes (Unsigned)
[facility] Spiritual Care Note  Referred by Dr Tammi Klippel for emotional support through [facility], alongside behavioral health referrals as noted in the chart.  Met Mr Braun in infusion. He reports feeling significantly better emotionally now that he is adjusting to his diagnosis and treatment plan. Provided pastoral presence and introduction to Spiritual Care as an additional layer of support, normalizing utilization of support resources.  Plan to follow up by phone     He notes no other concerns at this time and plans to phone as needed/desired.   [place], [place], [facility] Pager [phone] Voicemail [phone]

## 2021-08-17 NOTE — Progress Notes (Signed)
Ok to speed up post hydration fluids per Dr. Lorenso Courier. Advised by pharmacy to run at 500cc/Hr

## 2021-08-17 NOTE — Progress Notes (Signed)
Ok to proceed with treatment without magnesium lab results per Dr. Lorenso Courier.

## 2021-08-17 NOTE — Patient Instructions (Signed)
[facility]  Discharge Instructions: Thank you for choosing [facility] to provide your oncology and hematology care.   If you have a lab appointment with the [facility], please go directly to the [facility] and check in at the registration area.   Wear comfortable clothing and clothing appropriate for easy access to any Portacath or PICC line.   We strive to give you quality time with your provider. You may need to reschedule your appointment if you arrive late (15 or more minutes).  Arriving late affects you and other patients whose appointments are after yours.  Also, if you miss three or more appointments without notifying the office, you may be dismissed from the clinic at the provider's discretion.      For prescription refill requests, have your pharmacy contact our office and allow 72 hours for refills to be completed.    Today you received the following chemotherapy and/or immunotherapy agents: Etoposide & Cisplatin   To help prevent nausea and vomiting after your treatment, we encourage you to take your nausea medication as directed.  BELOW ARE SYMPTOMS THAT SHOULD BE REPORTED IMMEDIATELY: *FEVER GREATER THAN 100.4 F (38 C) OR HIGHER *CHILLS OR SWEATING *NAUSEA AND VOMITING THAT IS NOT CONTROLLED WITH YOUR NAUSEA MEDICATION *UNUSUAL SHORTNESS OF BREATH *UNUSUAL BRUISING OR BLEEDING *URINARY PROBLEMS (pain or burning when urinating, or frequent urination) *BOWEL PROBLEMS (unusual diarrhea, constipation, pain near the anus) TENDERNESS IN MOUTH AND THROAT WITH OR WITHOUT PRESENCE OF ULCERS (sore throat, sores in mouth, or a toothache) UNUSUAL RASH, SWELLING OR PAIN  UNUSUAL VAGINAL DISCHARGE OR ITCHING   Items with * indicate a potential emergency and should be followed up as soon as possible or go to the Emergency Department if any problems should occur.  Please show the CHEMOTHERAPY ALERT CARD or IMMUNOTHERAPY ALERT CARD at  check-in to the Emergency Department and triage nurse.  Should you have questions after your visit or need to cancel or reschedule your appointment, please contact [facility]  Dept: [phone]  and follow the prompts.  Office hours are 8:00 a.m. to 4:30 p.m. Monday - Friday. Please note that voicemails left after 4:00 p.m. may not be returned until the following business day.  We are closed weekends and major holidays. You have access to a nurse at all times for urgent questions. Please call the main number to the clinic Dept: [phone] and follow the prompts.   For any non-urgent questions, you may also contact your provider using MyChart. We now offer e-Visits for anyone 78 and older to request care online for non-urgent symptoms. For details visit [email or web address].   Also download the MyChart app! Go to the app store, search "MyChart", open the app, select [facility], and log in with your MyChart username and password.  Due to Covid, a mask is required upon entering the hospital/clinic. If you do not have a mask, one will be given to you upon arrival. For doctor visits, patients may have 1 support person aged 14 or older with them. For treatment visits, patients cannot have anyone with them due to current Covid guidelines and our immunocompromised population.

## 2021-08-18 ENCOUNTER — Other Ambulatory Visit: Payer: Self-pay

## 2021-08-18 ENCOUNTER — Inpatient Hospital Stay: Payer: Medicaid Other

## 2021-08-18 ENCOUNTER — Inpatient Hospital Stay: Payer: Medicaid Other | Attending: Hematology and Oncology

## 2021-08-18 ENCOUNTER — Ambulatory Visit
Admission: RE | Admit: 2021-08-18 | Discharge: 2021-08-18 | Disposition: A | Discharge status: Home / Self Care | Payer: Medicaid Other | Source: Ambulatory Visit | Attending: Radiation Oncology | Admitting: Radiation Oncology

## 2021-08-18 VITALS — BP 107/69 | HR 86 | Temp 98.2°F | Resp 16

## 2021-08-18 DIAGNOSIS — Z5111 Encounter for antineoplastic chemotherapy: Secondary | ICD-10-CM | POA: Insufficient documentation

## 2021-08-18 DIAGNOSIS — C3432 Malignant neoplasm of lower lobe, left bronchus or lung: Secondary | ICD-10-CM | POA: Diagnosis not present

## 2021-08-18 DIAGNOSIS — Z79899 Other long term (current) drug therapy: Secondary | ICD-10-CM | POA: Diagnosis not present

## 2021-08-18 DIAGNOSIS — K1379 Other lesions of oral mucosa: Secondary | ICD-10-CM | POA: Insufficient documentation

## 2021-08-18 LAB — RAD ONC ARIA SESSION SUMMARY
Course Elapsed Days: 2
Plan Fractions Treated to Date: 3
Plan Prescribed Dose Per Fraction: 2 Gy
Plan Total Fractions Prescribed: 33
Plan Total Prescribed Dose: 66 Gy
Reference Point Dosage Given to Date: 6 Gy
Reference Point Session Dosage Given: 2 Gy
Session Number: 3

## 2021-08-18 MED ORDER — SODIUM CHLORIDE 0.9 % IV SOLN
100.0000 mg/m2 | Freq: Once | INTRAVENOUS | Status: AC
  Administered 2021-08-18: 160 mg via INTRAVENOUS
  Filled 2021-08-18: qty 8

## 2021-08-18 MED ORDER — SODIUM CHLORIDE 0.9 % IV SOLN: Freq: Once | INTRAVENOUS | Status: AC

## 2021-08-18 MED ORDER — SODIUM CHLORIDE 0.9 % IV SOLN
10.0000 mg | Freq: Once | INTRAVENOUS | Status: AC
  Administered 2021-08-18: 10 mg via INTRAVENOUS
  Filled 2021-08-18: qty 10

## 2021-08-18 MED ORDER — SODIUM CHLORIDE 0.9% FLUSH
10.0000 mL | INTRAVENOUS | Status: DC | PRN
  Administered 2021-08-18: 10 mL

## 2021-08-18 MED ORDER — HEPARIN SOD (PORK) LOCK FLUSH 100 UNIT/ML IV SOLN
500.0000 [IU] | Freq: Once | INTRAVENOUS | Status: AC | PRN
  Administered 2021-08-18: 500 [IU]

## 2021-08-18 MED FILL — Dexamethasone Sodium Phosphate Inj 100 MG/10ML: INTRAMUSCULAR | Qty: 1 | Status: AC

## 2021-08-18 NOTE — Patient Instructions (Signed)
[facility]  Discharge Instructions: Thank you for choosing [facility] to provide your oncology and hematology care.   If you have a lab appointment with the [facility], please go directly to the [facility] and check in at the registration area.   Wear comfortable clothing and clothing appropriate for easy access to any Portacath or PICC line.   We strive to give you quality time with your provider. You may need to reschedule your appointment if you arrive late (15 or more minutes).  Arriving late affects you and other patients whose appointments are after yours.  Also, if you miss three or more appointments without notifying the office, you may be dismissed from the clinic at the provider's discretion.      For prescription refill requests, have your pharmacy contact our office and allow 72 hours for refills to be completed.    Today you received the following chemotherapy and/or immunotherapy agents: Etoposide   To help prevent nausea and vomiting after your treatment, we encourage you to take your nausea medication as directed.  BELOW ARE SYMPTOMS THAT SHOULD BE REPORTED IMMEDIATELY: *FEVER GREATER THAN 100.4 F (38 C) OR HIGHER *CHILLS OR SWEATING *NAUSEA AND VOMITING THAT IS NOT CONTROLLED WITH YOUR NAUSEA MEDICATION *UNUSUAL SHORTNESS OF BREATH *UNUSUAL BRUISING OR BLEEDING *URINARY PROBLEMS (pain or burning when urinating, or frequent urination) *BOWEL PROBLEMS (unusual diarrhea, constipation, pain near the anus) TENDERNESS IN MOUTH AND THROAT WITH OR WITHOUT PRESENCE OF ULCERS (sore throat, sores in mouth, or a toothache) UNUSUAL RASH, SWELLING OR PAIN  UNUSUAL VAGINAL DISCHARGE OR ITCHING   Items with * indicate a potential emergency and should be followed up as soon as possible or go to the Emergency Department if any problems should occur.  Please show the CHEMOTHERAPY ALERT CARD or IMMUNOTHERAPY ALERT CARD at check-in to the  Emergency Department and triage nurse.  Should you have questions after your visit or need to cancel or reschedule your appointment, please contact [facility]  Dept: [phone]  and follow the prompts.  Office hours are 8:00 a.m. to 4:30 p.m. Monday - Friday. Please note that voicemails left after 4:00 p.m. may not be returned until the following business day.  We are closed weekends and major holidays. You have access to a nurse at all times for urgent questions. Please call the main number to the clinic Dept: [phone] and follow the prompts.   For any non-urgent questions, you may also contact your provider using MyChart. We now offer e-Visits for anyone 75 and older to request care online for non-urgent symptoms. For details visit [email or web address].   Also download the MyChart app! Go to the app store, search "MyChart", open the app, select [facility], and log in with your MyChart username and password.  Due to Covid, a mask is required upon entering the hospital/clinic. If you do not have a mask, one will be given to you upon arrival. For doctor visits, patients may have 1 support person aged 58 or older with them. For treatment visits, patients cannot have anyone with them due to current Covid guidelines and our immunocompromised population.

## 2021-08-19 ENCOUNTER — Encounter: Payer: Self-pay | Admitting: Hematology and Oncology

## 2021-08-19 ENCOUNTER — Encounter: Payer: Self-pay | Admitting: General Practice

## 2021-08-19 ENCOUNTER — Inpatient Hospital Stay: Payer: Medicaid Other

## 2021-08-19 ENCOUNTER — Other Ambulatory Visit: Payer: Self-pay

## 2021-08-19 ENCOUNTER — Ambulatory Visit
Admission: RE | Admit: 2021-08-19 | Discharge: 2021-08-19 | Disposition: A | Discharge status: Home / Self Care | Payer: Medicaid Other | Source: Ambulatory Visit | Attending: Radiation Oncology | Admitting: Radiation Oncology

## 2021-08-19 VITALS — BP 121/78 | HR 82 | Temp 98.7°F | Resp 17

## 2021-08-19 DIAGNOSIS — C3432 Malignant neoplasm of lower lobe, left bronchus or lung: Secondary | ICD-10-CM | POA: Diagnosis not present

## 2021-08-19 DIAGNOSIS — Z5111 Encounter for antineoplastic chemotherapy: Secondary | ICD-10-CM | POA: Diagnosis not present

## 2021-08-19 LAB — RAD ONC ARIA SESSION SUMMARY
Course Elapsed Days: 3
Plan Fractions Treated to Date: 4
Plan Prescribed Dose Per Fraction: 2 Gy
Plan Total Fractions Prescribed: 33
Plan Total Prescribed Dose: 66 Gy
Reference Point Dosage Given to Date: 8 Gy
Reference Point Session Dosage Given: 2 Gy
Session Number: 4

## 2021-08-19 MED ORDER — SODIUM CHLORIDE 0.9 % IV SOLN
100.0000 mg/m2 | Freq: Once | INTRAVENOUS | Status: AC
  Administered 2021-08-19: 160 mg via INTRAVENOUS
  Filled 2021-08-19: qty 8

## 2021-08-19 MED ORDER — SONAFINE EX EMUL
1.0000 "application " | Freq: Two times a day (BID) | CUTANEOUS | Status: DC
  Administered 2021-08-19: 1 via TOPICAL

## 2021-08-19 MED ORDER — HEPARIN SOD (PORK) LOCK FLUSH 100 UNIT/ML IV SOLN
500.0000 [IU] | Freq: Once | INTRAVENOUS | Status: AC | PRN
  Administered 2021-08-19: 500 [IU]

## 2021-08-19 MED ORDER — SODIUM CHLORIDE 0.9 % IV SOLN: Freq: Once | INTRAVENOUS | Status: AC

## 2021-08-19 MED ORDER — SODIUM CHLORIDE 0.9 % IV SOLN
10.0000 mg | Freq: Once | INTRAVENOUS | Status: AC
  Administered 2021-08-19: 10 mg via INTRAVENOUS
  Filled 2021-08-19: qty 10

## 2021-08-19 MED ORDER — SODIUM CHLORIDE 0.9% FLUSH
10.0000 mL | INTRAVENOUS | Status: DC | PRN
  Administered 2021-08-19: 10 mL

## 2021-08-19 NOTE — Patient Instructions (Signed)
[facility]  Discharge Instructions: Thank you for choosing [facility] to provide your oncology and hematology care.   If you have a lab appointment with the [facility], please go directly to the [facility] and check in at the registration area.   Wear comfortable clothing and clothing appropriate for easy access to any Portacath or PICC line.   We strive to give you quality time with your provider. You may need to reschedule your appointment if you arrive late (15 or more minutes).  Arriving late affects you and other patients whose appointments are after yours.  Also, if you miss three or more appointments without notifying the office, you may be dismissed from the clinic at the provider's discretion.      For prescription refill requests, have your pharmacy contact our office and allow 72 hours for refills to be completed.    Today you received the following chemotherapy and/or immunotherapy agents: Etoposide   To help prevent nausea and vomiting after your treatment, we encourage you to take your nausea medication as directed.  BELOW ARE SYMPTOMS THAT SHOULD BE REPORTED IMMEDIATELY: *FEVER GREATER THAN 100.4 F (38 C) OR HIGHER *CHILLS OR SWEATING *NAUSEA AND VOMITING THAT IS NOT CONTROLLED WITH YOUR NAUSEA MEDICATION *UNUSUAL SHORTNESS OF BREATH *UNUSUAL BRUISING OR BLEEDING *URINARY PROBLEMS (pain or burning when urinating, or frequent urination) *BOWEL PROBLEMS (unusual diarrhea, constipation, pain near the anus) TENDERNESS IN MOUTH AND THROAT WITH OR WITHOUT PRESENCE OF ULCERS (sore throat, sores in mouth, or a toothache) UNUSUAL RASH, SWELLING OR PAIN  UNUSUAL VAGINAL DISCHARGE OR ITCHING   Items with * indicate a potential emergency and should be followed up as soon as possible or go to the Emergency Department if any problems should occur.  Please show the CHEMOTHERAPY ALERT CARD or IMMUNOTHERAPY ALERT CARD at check-in to the  Emergency Department and triage nurse.  Should you have questions after your visit or need to cancel or reschedule your appointment, please contact [facility]  Dept: [phone]  and follow the prompts.  Office hours are 8:00 a.m. to 4:30 p.m. Monday - Friday. Please note that voicemails left after 4:00 p.m. may not be returned until the following business day.  We are closed weekends and major holidays. You have access to a nurse at all times for urgent questions. Please call the main number to the clinic Dept: [phone] and follow the prompts.   For any non-urgent questions, you may also contact your provider using MyChart. We now offer e-Visits for anyone 28 and older to request care online for non-urgent symptoms. For details visit [email or web address].   Also download the MyChart app! Go to the app store, search "MyChart", open the app, select [facility], and log in with your MyChart username and password.  Due to Covid, a mask is required upon entering the hospital/clinic. If you do not have a mask, one will be given to you upon arrival. For doctor visits, patients may have 1 support person aged 48 or older with them. For treatment visits, patients cannot have anyone with them due to current Covid guidelines and our immunocompromised population.

## 2021-08-19 NOTE — Progress Notes (Signed)
Pt here for patient teaching. Pt given Radiation and You booklet, skin care instructions, and Sonafine. Reviewed areas of pertinence such as fatigue, hair loss, mouth changes, nausea and vomiting, skin changes, throat changes, breast tenderness, cough, shortness of breath, and earaches. Pt able to give teach back of to pat skin, use unscented/gentle soap, have Imodium on hand, and drink plenty of water, apply Sonafine bid, avoid applying anything to skin within 4 hours of treatment, and to use an electric razor if they must shave. Pt verbalizes understanding of information given and will contact nursing with any questions or concerns.     [email or web address]

## 2021-08-19 NOTE — Progress Notes (Signed)
[facility] Spiritual Care Note  Followed up with Mr Podesta, learning more about his health story and his support (including dad and stepmom, son, mom, a couple of close friends nearby, several friends at a distance, and a retired primary doctor who has provided helpful and remarkable care).  Encouraged him to follow up on emotional support referrals while he is feeling better so that he'll have support in place if the common emotional rollercoaster hits more low points. Provided pastoral presence, empathic listening, normalization of feelings, and affirmation of strengths (especially accepting help).  We plan to follow up at a future treatment, and he also has my direct number in case needs arise in the meantime.   [place], [place], [facility] Pager [phone] Voicemail [phone]

## 2021-08-20 ENCOUNTER — Encounter: Payer: Self-pay | Admitting: Hematology and Oncology
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
## 2021-08-22 ENCOUNTER — Other Ambulatory Visit: Payer: Self-pay

## 2021-08-22 ENCOUNTER — Ambulatory Visit
Admission: RE | Admit: 2021-08-22 | Discharge: 2021-08-22 | Disposition: A | Discharge status: Home / Self Care | Payer: Medicaid Other | Source: Ambulatory Visit | Attending: Radiation Oncology | Admitting: Radiation Oncology

## 2021-08-22 ENCOUNTER — Telehealth: Payer: Self-pay | Admitting: *Deleted

## 2021-08-22 DIAGNOSIS — C3432 Malignant neoplasm of lower lobe, left bronchus or lung: Secondary | ICD-10-CM | POA: Diagnosis not present

## 2021-08-22 LAB — RAD ONC ARIA SESSION SUMMARY
Course Elapsed Days: 6
Plan Fractions Treated to Date: 5
Plan Prescribed Dose Per Fraction: 2 Gy
Plan Total Fractions Prescribed: 33
Plan Total Prescribed Dose: 66 Gy
Reference Point Dosage Given to Date: 10 Gy
Reference Point Session Dosage Given: 2 Gy
Session Number: 5

## 2021-08-22 NOTE — Telephone Encounter (Signed)
-----   Message from Anastasia Pall, RN sent at 08/17/2021  5:16 PM EDT ----- Regarding: 1st time cisplatin/etoposide, being seen by Dr. Ranae Palms, Mr. Dakota Bryant received his 1st cisplatin/etoposide infusion today. He tolerated it well. Please give him and call and follow-up tomorrow.
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
## 2021-08-23 ENCOUNTER — Other Ambulatory Visit: Payer: Self-pay

## 2021-08-23 ENCOUNTER — Ambulatory Visit
Admission: RE | Admit: 2021-08-23 | Discharge: 2021-08-23 | Disposition: A | Discharge status: Home / Self Care | Payer: Medicaid Other | Source: Ambulatory Visit | Attending: Radiation Oncology | Admitting: Radiation Oncology

## 2021-08-23 DIAGNOSIS — C3432 Malignant neoplasm of lower lobe, left bronchus or lung: Secondary | ICD-10-CM | POA: Diagnosis not present

## 2021-08-23 LAB — RAD ONC ARIA SESSION SUMMARY
Course Elapsed Days: 7
Plan Fractions Treated to Date: 6
Plan Prescribed Dose Per Fraction: 2 Gy
Plan Total Fractions Prescribed: 33
Plan Total Prescribed Dose: 66 Gy
Reference Point Dosage Given to Date: 12 Gy
Reference Point Session Dosage Given: 2 Gy
Session Number: 6

## 2021-08-24 ENCOUNTER — Ambulatory Visit
Admission: RE | Admit: 2021-08-24 | Discharge: 2021-08-24 | Disposition: A | Discharge status: Home / Self Care | Payer: Medicaid Other | Source: Ambulatory Visit | Attending: Radiation Oncology | Admitting: Radiation Oncology

## 2021-08-24 ENCOUNTER — Other Ambulatory Visit: Payer: Self-pay

## 2021-08-24 ENCOUNTER — Other Ambulatory Visit: Payer: Self-pay | Admitting: Hematology and Oncology

## 2021-08-24 DIAGNOSIS — C3432 Malignant neoplasm of lower lobe, left bronchus or lung: Secondary | ICD-10-CM | POA: Diagnosis not present

## 2021-08-24 LAB — RAD ONC ARIA SESSION SUMMARY
Course Elapsed Days: 8
Plan Fractions Treated to Date: 7
Plan Prescribed Dose Per Fraction: 2 Gy
Plan Total Fractions Prescribed: 33
Plan Total Prescribed Dose: 66 Gy
Reference Point Dosage Given to Date: 14 Gy
Reference Point Session Dosage Given: 2 Gy
Session Number: 7

## 2021-08-25 ENCOUNTER — Other Ambulatory Visit: Payer: Self-pay

## 2021-08-25 ENCOUNTER — Ambulatory Visit
Admission: RE | Admit: 2021-08-25 | Discharge: 2021-08-25 | Disposition: A | Discharge status: Home / Self Care | Payer: Medicaid Other | Source: Ambulatory Visit | Attending: Radiation Oncology | Admitting: Radiation Oncology

## 2021-08-25 DIAGNOSIS — C3432 Malignant neoplasm of lower lobe, left bronchus or lung: Secondary | ICD-10-CM | POA: Diagnosis not present

## 2021-08-25 LAB — RAD ONC ARIA SESSION SUMMARY
Course Elapsed Days: 9
Plan Fractions Treated to Date: 8
Plan Prescribed Dose Per Fraction: 2 Gy
Plan Total Fractions Prescribed: 33
Plan Total Prescribed Dose: 66 Gy
Reference Point Dosage Given to Date: 16 Gy
Reference Point Session Dosage Given: 2 Gy
Session Number: 8

## 2021-08-26 ENCOUNTER — Encounter: Payer: Self-pay | Admitting: General Practice

## 2021-08-26 ENCOUNTER — Other Ambulatory Visit: Payer: Self-pay

## 2021-08-26 ENCOUNTER — Other Ambulatory Visit: Payer: Self-pay | Admitting: Radiation Oncology

## 2021-08-26 ENCOUNTER — Ambulatory Visit
Admission: RE | Admit: 2021-08-26 | Discharge: 2021-08-26 | Disposition: A | Discharge status: Home / Self Care | Payer: Medicaid Other | Source: Ambulatory Visit | Attending: Radiation Oncology | Admitting: Radiation Oncology

## 2021-08-26 DIAGNOSIS — C3432 Malignant neoplasm of lower lobe, left bronchus or lung: Secondary | ICD-10-CM | POA: Diagnosis not present

## 2021-08-26 LAB — RAD ONC ARIA SESSION SUMMARY
Course Elapsed Days: 10
Plan Fractions Treated to Date: 9
Plan Prescribed Dose Per Fraction: 2 Gy
Plan Total Fractions Prescribed: 33
Plan Total Prescribed Dose: 66 Gy
Reference Point Dosage Given to Date: 18 Gy
Reference Point Session Dosage Given: 2 Gy
Session Number: 9

## 2021-08-26 LAB — FUNGAL ORGANISM REFLEX

## 2021-08-26 LAB — FUNGUS CULTURE WITH STAIN

## 2021-08-26 LAB — FUNGUS CULTURE RESULT

## 2021-08-26 MED ORDER — SUCRALFATE 1 G PO TABS
1.0000 g | ORAL_TABLET | Freq: Three times a day (TID) | ORAL | 2 refills | Status: DC
Start: 2021-08-26 — End: 2022-01-11

## 2021-08-26 NOTE — Progress Notes (Signed)
[name] Spiritual Care Note  Followed up with Dakota Bryant by phone, given longer stretch between chemo treatments. He was very welcoming of call and shared about his sources of meaning, purpose, and coping. 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 He also writes poems and songs, leaning heavily on his faith.)  He notes that he is doing much better emotionally than he was in a recent period of feeling more hopeless. He states that, at that earlier time, he "thought the world might be better off without me...but I never had a plan" for self-harm of any kind. Dakota Bryant states that feels much better now and "know[s] God has a purpose for me." This gives him a sense of hope, comfort, and mission in life. He reports good support from a close friend who supports this sense of divine purpose and divine healing.  We plan to follow up by phone next week, and Dakota Bryant has direct chaplain number in case needs arise or he wishes to make a Spiritual Care appointment.   [place], [place], [facility] Pager [phone] Voicemail [phone]

## 2021-08-29 ENCOUNTER — Other Ambulatory Visit: Payer: Self-pay

## 2021-08-29 ENCOUNTER — Ambulatory Visit
Admission: RE | Admit: 2021-08-29 | Discharge: 2021-08-29 | Disposition: A | Discharge status: Home / Self Care | Payer: Medicaid Other | Source: Ambulatory Visit | Attending: Radiation Oncology | Admitting: Radiation Oncology

## 2021-08-29 DIAGNOSIS — C3432 Malignant neoplasm of lower lobe, left bronchus or lung: Secondary | ICD-10-CM | POA: Diagnosis not present

## 2021-08-29 LAB — RAD ONC ARIA SESSION SUMMARY
Course Elapsed Days: 13
Plan Fractions Treated to Date: 10
Plan Prescribed Dose Per Fraction: 2 Gy
Plan Total Fractions Prescribed: 33
Plan Total Prescribed Dose: 66 Gy
Reference Point Dosage Given to Date: 20 Gy
Reference Point Session Dosage Given: 2 Gy
Session Number: 10

## 2021-08-30 ENCOUNTER — Ambulatory Visit
Admission: RE | Admit: 2021-08-30 | Discharge: 2021-08-30 | Disposition: A | Discharge status: Home / Self Care | Payer: Medicaid Other | Source: Ambulatory Visit | Attending: Radiation Oncology | Admitting: Radiation Oncology

## 2021-08-30 ENCOUNTER — Other Ambulatory Visit: Payer: Self-pay

## 2021-08-30 DIAGNOSIS — C3432 Malignant neoplasm of lower lobe, left bronchus or lung: Secondary | ICD-10-CM | POA: Diagnosis not present

## 2021-08-30 LAB — RAD ONC ARIA SESSION SUMMARY
Course Elapsed Days: 14
Plan Fractions Treated to Date: 11
Plan Prescribed Dose Per Fraction: 2 Gy
Plan Total Fractions Prescribed: 33
Plan Total Prescribed Dose: 66 Gy
Reference Point Dosage Given to Date: 22 Gy
Reference Point Session Dosage Given: 2 Gy
Session Number: 11

## 2021-08-31 ENCOUNTER — Ambulatory Visit
Admission: RE | Admit: 2021-08-31 | Discharge: 2021-08-31 | Disposition: A | Discharge status: Home / Self Care | Payer: Medicaid Other | Source: Ambulatory Visit | Attending: Radiation Oncology | Admitting: Radiation Oncology

## 2021-08-31 ENCOUNTER — Other Ambulatory Visit: Payer: Self-pay

## 2021-08-31 ENCOUNTER — Telehealth: Payer: Self-pay | Admitting: Dietician

## 2021-08-31 ENCOUNTER — Inpatient Hospital Stay: Payer: Medicaid Other | Admitting: Dietician

## 2021-08-31 DIAGNOSIS — C3432 Malignant neoplasm of lower lobe, left bronchus or lung: Secondary | ICD-10-CM | POA: Diagnosis not present

## 2021-08-31 LAB — RAD ONC ARIA SESSION SUMMARY
Course Elapsed Days: 15
Plan Fractions Treated to Date: 12
Plan Prescribed Dose Per Fraction: 2 Gy
Plan Total Fractions Prescribed: 33
Plan Total Prescribed Dose: 66 Gy
Reference Point Dosage Given to Date: 24 Gy
Reference Point Session Dosage Given: 2 Gy
Session Number: 12

## 2021-08-31 NOTE — Telephone Encounter (Signed)
Nutrition Follow Up:     Reason for Assessment: Follow up on PO intake for weight loss.      ASSESSMENT: Patient returned call patient from his mobile#.    He reports many intolerance now including beef, Mt. Dew, and many juices and fruits that increase his GERD pain.  He also relayed he has an abscess or growth in mouth that comes and goes and with radiation it seems to be flaring at this time.  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 He has started including some Activia for gut health, and is trying to get some chicken and pork in for protein as well as eggs every couple days (not overly excited to eat them).  Fluids: Apple and pineapple juice at room temp, bottle of water every,     Anthropometrics:    Height: 66" Weight:  08/26/21  123.2# at radiation PUT 08/17/21  129.4# 08/02/21   125.2# 07/05/21   124# 06/07/21  140.7# 06/05/21  144#   UBW: 125-130 BMI: 19.9  INTERVENTION: Reviewed high calorie snacking options to aid in weight maintenance.  Reviewed budget friendly snacking, he enjoys peanut butter and would try to increase. Encouraged replacing Ester. Dew calories and fluids with low acid juices, and ONS.  Encouraged him to reach with questions or concerns.    Next Visit: Telephone consult next month.   April Manson, RDN, LDN Registered Dietitian, [place] Part Time Remote (Usual office hours: Tuesday-Thursday) Cell: [phone]

## 2021-08-31 NOTE — Progress Notes (Signed)
Called patient at his mobile #.  He was still not home, his ride for his treatment still driving him.  I asked him to call back when he was at home and could speak freely.  April Manson, RDN, LDN Registered Dietitian, [place] Part Time Remote (Usual office hours: Tuesday-Thursday) Remote Office: [phone]

## 2021-09-01 ENCOUNTER — Ambulatory Visit
Admission: RE | Admit: 2021-09-01 | Discharge: 2021-09-01 | Disposition: A | Discharge status: Home / Self Care | Payer: Medicaid Other | Source: Ambulatory Visit | Attending: Radiation Oncology | Admitting: Radiation Oncology

## 2021-09-01 ENCOUNTER — Other Ambulatory Visit: Payer: Self-pay

## 2021-09-01 DIAGNOSIS — C3432 Malignant neoplasm of lower lobe, left bronchus or lung: Secondary | ICD-10-CM | POA: Diagnosis not present

## 2021-09-01 LAB — RAD ONC ARIA SESSION SUMMARY
Course Elapsed Days: 16
Plan Fractions Treated to Date: 13
Plan Prescribed Dose Per Fraction: 2 Gy
Plan Total Fractions Prescribed: 33
Plan Total Prescribed Dose: 66 Gy
Reference Point Dosage Given to Date: 26 Gy
Reference Point Session Dosage Given: 2 Gy
Session Number: 13

## 2021-09-02 ENCOUNTER — Ambulatory Visit
Admission: RE | Admit: 2021-09-02 | Discharge: 2021-09-02 | Disposition: A | Discharge status: Home / Self Care | Payer: Medicaid Other | Source: Ambulatory Visit | Attending: Radiation Oncology | Admitting: Radiation Oncology

## 2021-09-02 ENCOUNTER — Telehealth: Payer: Self-pay | Admitting: Pulmonary Disease

## 2021-09-02 ENCOUNTER — Other Ambulatory Visit: Payer: Self-pay | Admitting: Radiation Oncology

## 2021-09-02 ENCOUNTER — Other Ambulatory Visit: Payer: Self-pay

## 2021-09-02 ENCOUNTER — Ambulatory Visit: Payer: Self-pay | Admitting: Pulmonary Disease

## 2021-09-02 ENCOUNTER — Encounter: Payer: Self-pay | Admitting: General Practice

## 2021-09-02 DIAGNOSIS — C3432 Malignant neoplasm of lower lobe, left bronchus or lung: Secondary | ICD-10-CM | POA: Diagnosis not present

## 2021-09-02 DIAGNOSIS — K148 Other diseases of tongue: Secondary | ICD-10-CM

## 2021-09-02 LAB — RAD ONC ARIA SESSION SUMMARY
Course Elapsed Days: 17
Plan Fractions Treated to Date: 14
Plan Prescribed Dose Per Fraction: 2 Gy
Plan Total Fractions Prescribed: 33
Plan Total Prescribed Dose: 66 Gy
Reference Point Dosage Given to Date: 28 Gy
Reference Point Session Dosage Given: 2 Gy
Session Number: 14

## 2021-09-02 MED ORDER — ANORO ELLIPTA 62.5-25 MCG/ACT IN AEPB: 1.0000 | INHALATION_SPRAY | Freq: Every day | RESPIRATORY_TRACT | 5 refills | Status: DC

## 2021-09-02 NOTE — Telephone Encounter (Signed)
Called and spoke with patient. He stated that his current inhaler was causing him to cough more. He described the inhaler and he was still using the Spiriva inhaler. At his last visit, Anoro was prescribed for him. He stated that he had never received the Anoro before and wanted to try it.   I offered to have it sent to [facility] but he wanted it to be sent to [facility] in [place]. RX has been sent.   Nothing further needed at time of call.

## 2021-09-02 NOTE — Telephone Encounter (Signed)
ATC patient, unable to leave vm due to mailbox not being set up will try to call again on Monday  Patient states Anoro is too expensive at Pharmacy. Would like something under $200.00.

## 2021-09-02 NOTE — Progress Notes (Signed)
[place] Spiritual Care Note  Reached Mr Hillary by phone for follow-up support. He notes that overall he is doing well and that he hasn't reached that former hopeless feeling again. He reports that a recent prescription is relieving his sore throat significantly. He is appreciative of Dr Johny Shears referral to investigate a sore on his tongue.  We plan to follow up in ca two weeks for another pastoral check-in.  [place], [place], [facility] Pager [phone] Voicemail [phone]

## 2021-09-05 ENCOUNTER — Other Ambulatory Visit: Payer: Self-pay

## 2021-09-05 ENCOUNTER — Ambulatory Visit
Admission: RE | Admit: 2021-09-05 | Discharge: 2021-09-05 | Disposition: A | Discharge status: Home / Self Care | Payer: Medicaid Other | Source: Ambulatory Visit | Attending: Radiation Oncology | Admitting: Radiation Oncology

## 2021-09-05 DIAGNOSIS — C3432 Malignant neoplasm of lower lobe, left bronchus or lung: Secondary | ICD-10-CM | POA: Diagnosis not present

## 2021-09-05 LAB — RAD ONC ARIA SESSION SUMMARY
Course Elapsed Days: 20
Plan Fractions Treated to Date: 15
Plan Prescribed Dose Per Fraction: 2 Gy
Plan Total Fractions Prescribed: 33
Plan Total Prescribed Dose: 66 Gy
Reference Point Dosage Given to Date: 30 Gy
Reference Point Session Dosage Given: 2 Gy
Session Number: 15

## 2021-09-06 ENCOUNTER — Other Ambulatory Visit: Payer: Self-pay

## 2021-09-06 ENCOUNTER — Encounter (HOSPITAL_COMMUNITY)
Admission: RE | Admit: 2021-09-06 | Discharge: 2021-09-06 | Disposition: A | Discharge status: Home / Self Care | Payer: Medicaid Other | Source: Ambulatory Visit | Attending: Gastroenterology | Admitting: Gastroenterology

## 2021-09-06 ENCOUNTER — Ambulatory Visit
Admission: RE | Admit: 2021-09-06 | Discharge: 2021-09-06 | Disposition: A | Discharge status: Home / Self Care | Payer: Medicaid Other | Source: Ambulatory Visit | Attending: Radiation Oncology | Admitting: Radiation Oncology

## 2021-09-06 ENCOUNTER — Telehealth: Payer: Self-pay

## 2021-09-06 DIAGNOSIS — C3432 Malignant neoplasm of lower lobe, left bronchus or lung: Secondary | ICD-10-CM | POA: Diagnosis not present

## 2021-09-06 LAB — RAD ONC ARIA SESSION SUMMARY
Course Elapsed Days: 21
Plan Fractions Treated to Date: 16
Plan Prescribed Dose Per Fraction: 2 Gy
Plan Total Fractions Prescribed: 33
Plan Total Prescribed Dose: 66 Gy
Reference Point Dosage Given to Date: 32 Gy
Reference Point Session Dosage Given: 2 Gy
Session Number: 16

## 2021-09-06 NOTE — Telephone Encounter (Signed)
Pt has been scheduled for a colonoscopy for 6/23.  He is having concerns about his already scheduled appts, having to fast and go without food.  Pt advised to discuss at 6/21 appt

## 2021-09-07 ENCOUNTER — Other Ambulatory Visit: Payer: Self-pay

## 2021-09-07 ENCOUNTER — Other Ambulatory Visit: Payer: Self-pay | Admitting: Hematology and Oncology

## 2021-09-07 ENCOUNTER — Ambulatory Visit
Admission: RE | Admit: 2021-09-07 | Discharge: 2021-09-07 | Disposition: A | Discharge status: Home / Self Care | Payer: Medicaid Other | Source: Ambulatory Visit | Attending: Radiation Oncology | Admitting: Radiation Oncology

## 2021-09-07 ENCOUNTER — Inpatient Hospital Stay: Payer: Medicaid Other

## 2021-09-07 ENCOUNTER — Inpatient Hospital Stay (HOSPITAL_BASED_OUTPATIENT_CLINIC_OR_DEPARTMENT_OTHER): Payer: Medicaid Other | Admitting: Hematology and Oncology

## 2021-09-07 VITALS — BP 116/77 | HR 68 | Temp 97.6°F | Resp 16 | Wt 128.1 lb

## 2021-09-07 VITALS — BP 122/76 | HR 88 | Temp 97.7°F | Resp 16

## 2021-09-07 DIAGNOSIS — C3490 Malignant neoplasm of unspecified part of unspecified bronchus or lung: Secondary | ICD-10-CM

## 2021-09-07 DIAGNOSIS — C3432 Malignant neoplasm of lower lobe, left bronchus or lung: Secondary | ICD-10-CM

## 2021-09-07 DIAGNOSIS — R918 Other nonspecific abnormal finding of lung field: Secondary | ICD-10-CM

## 2021-09-07 DIAGNOSIS — Z5111 Encounter for antineoplastic chemotherapy: Secondary | ICD-10-CM | POA: Diagnosis not present

## 2021-09-07 LAB — RAD ONC ARIA SESSION SUMMARY
Course Elapsed Days: 22
Plan Fractions Treated to Date: 17
Plan Prescribed Dose Per Fraction: 2 Gy
Plan Total Fractions Prescribed: 33
Plan Total Prescribed Dose: 66 Gy
Reference Point Dosage Given to Date: 34 Gy
Reference Point Session Dosage Given: 2 Gy
Session Number: 17

## 2021-09-07 LAB — CMP (CANCER CENTER ONLY)
ALT: 9 U/L (ref 0–44)
AST: 11 U/L — ABNORMAL LOW (ref 15–41)
Albumin: 4.1 g/dL (ref 3.5–5.0)
Alkaline Phosphatase: 47 U/L (ref 38–126)
Anion gap: 4 — ABNORMAL LOW (ref 5–15)
BUN: 6 mg/dL (ref 6–20)
CO2: 29 mmol/L (ref 22–32)
Calcium: 9.2 mg/dL (ref 8.9–10.3)
Chloride: 96 mmol/L — ABNORMAL LOW (ref 98–111)
Creatinine: 0.59 mg/dL — ABNORMAL LOW (ref 0.61–1.24)
GFR, Estimated: >60 mL/min (ref >60)
Glucose, Bld: 80 mg/dL (ref 70–99)
Potassium: 4 mmol/L (ref 3.5–5.1)
Sodium: 129 mmol/L — ABNORMAL LOW (ref 135–145)
Total Bilirubin: 0.4 mg/dL (ref 0.3–1.2)
Total Protein: 6.4 g/dL — ABNORMAL LOW (ref 6.5–8.1)

## 2021-09-07 LAB — CBC WITH DIFFERENTIAL (CANCER CENTER ONLY)
Abs Immature Granulocytes: 0.02 10*3/uL (ref 0.00–0.07)
Basophils Absolute: 0 10*3/uL (ref 0.0–0.1)
Basophils Relative: 1 %
Eosinophils Absolute: 0.1 10*3/uL (ref 0.0–0.5)
Eosinophils Relative: 3 %
HCT: 36.6 % — ABNORMAL LOW (ref 39.0–52.0)
Hemoglobin: 13.4 g/dL (ref 13.0–17.0)
Immature Granulocytes: 1 %
Lymphocytes Relative: 31 %
Lymphs Abs: 0.9 10*3/uL (ref 0.7–4.0)
MCH: 32.7 pg (ref 26.0–34.0)
MCHC: 36.6 g/dL — ABNORMAL HIGH (ref 30.0–36.0)
MCV: 89.3 fL (ref 80.0–100.0)
Monocytes Absolute: 0.6 10*3/uL (ref 0.1–1.0)
Monocytes Relative: 19 %
Neutro Abs: 1.3 10*3/uL — ABNORMAL LOW (ref 1.7–7.7)
Neutrophils Relative %: 45 %
Platelet Count: 208 10*3/uL (ref 150–400)
RBC: 4.1 MIL/uL — ABNORMAL LOW (ref 4.22–5.81)
RDW: 14.1 % (ref 11.5–15.5)
WBC Count: 3 10*3/uL — ABNORMAL LOW (ref 4.0–10.5)
nRBC: 0 % (ref 0.0–0.2)

## 2021-09-07 LAB — MAGNESIUM: Magnesium: 1.7 mg/dL (ref 1.7–2.4)

## 2021-09-07 LAB — TSH: TSH: 3.044 u[IU]/mL (ref 0.350–4.500)

## 2021-09-07 MED ORDER — SODIUM CHLORIDE 0.9 % IV SOLN
80.0000 mg/m2 | Freq: Once | INTRAVENOUS | Status: AC
  Administered 2021-09-07: 130 mg via INTRAVENOUS
  Filled 2021-09-07: qty 130

## 2021-09-07 MED ORDER — SODIUM CHLORIDE 0.9% FLUSH
10.0000 mL | INTRAVENOUS | Status: AC | PRN
  Administered 2021-09-07: 10 mL

## 2021-09-07 MED ORDER — POTASSIUM CHLORIDE IN NACL 20-0.9 MEQ/L-% IV SOLN
Freq: Once | INTRAVENOUS | Status: AC
  Filled 2021-09-07: qty 1000

## 2021-09-07 MED ORDER — SODIUM CHLORIDE 0.9 % IV SOLN
10.0000 mg | Freq: Once | INTRAVENOUS | Status: AC
  Administered 2021-09-07: 10 mg via INTRAVENOUS
  Filled 2021-09-07: qty 10

## 2021-09-07 MED ORDER — SODIUM CHLORIDE 0.9 % IV SOLN
100.0000 mg/m2 | Freq: Once | INTRAVENOUS | Status: AC
  Administered 2021-09-07: 160 mg via INTRAVENOUS
  Filled 2021-09-07: qty 8

## 2021-09-07 MED ORDER — PALONOSETRON HCL INJECTION 0.25 MG/5ML
0.2500 mg | Freq: Once | INTRAVENOUS | Status: AC
  Administered 2021-09-07: 0.25 mg via INTRAVENOUS
  Filled 2021-09-07: qty 5

## 2021-09-07 MED ORDER — OXYCODONE HCL 5 MG PO TABS: 5.0000 mg | ORAL_TABLET | ORAL | 0 refills | Status: DC | PRN

## 2021-09-07 MED ORDER — SODIUM CHLORIDE 0.9 % IV SOLN
150.0000 mg | Freq: Once | INTRAVENOUS | Status: AC
  Administered 2021-09-07: 150 mg via INTRAVENOUS
  Filled 2021-09-07: qty 150

## 2021-09-07 MED ORDER — MAGNESIUM SULFATE 2 GM/50ML IV SOLN
2.0000 g | Freq: Once | INTRAVENOUS | Status: AC
  Administered 2021-09-07: 2 g via INTRAVENOUS
  Filled 2021-09-07: qty 50

## 2021-09-07 MED ORDER — SODIUM CHLORIDE 0.9% FLUSH
10.0000 mL | INTRAVENOUS | Status: DC | PRN
  Administered 2021-09-07: 10 mL

## 2021-09-07 MED ORDER — HEPARIN SOD (PORK) LOCK FLUSH 100 UNIT/ML IV SOLN
500.0000 [IU] | Freq: Once | INTRAVENOUS | Status: AC | PRN
  Administered 2021-09-07: 500 [IU]

## 2021-09-07 MED ORDER — SODIUM CHLORIDE 0.9 % IV SOLN: Freq: Once | INTRAVENOUS | Status: AC

## 2021-09-07 NOTE — Progress Notes (Signed)
Ok to run IVF with the cisplatin today per Dr. Lorenso Courier. Ok to treat with ANC of 1.3 per Dr. Lorenso Courier. Magnesium is 1.7.  Spoke with Dicie Beam, [facility] and it is acceptable to run the etoposide before the cisplatin in today's treatment.

## 2021-09-07 NOTE — Patient Instructions (Signed)
[facility]  Discharge Instructions: Thank you for choosing [facility] to provide your oncology and hematology care.   If you have a lab appointment with the [facility], please go directly to the [facility] and check in at the registration area.   Wear comfortable clothing and clothing appropriate for easy access to any Portacath or PICC line.   We strive to give you quality time with your provider. You may need to reschedule your appointment if you arrive late (15 or more minutes).  Arriving late affects you and other patients whose appointments are after yours.  Also, if you miss three or more appointments without notifying the office, you may be dismissed from the clinic at the provider's discretion.      For prescription refill requests, have your pharmacy contact our office and allow 72 hours for refills to be completed.    Today you received the following chemotherapy and/or immunotherapy agents: Etoposide, Cisplatin   To help prevent nausea and vomiting after your treatment, we encourage you to take your nausea medication as directed.  BELOW ARE SYMPTOMS THAT SHOULD BE REPORTED IMMEDIATELY: *FEVER GREATER THAN 100.4 F (38 C) OR HIGHER *CHILLS OR SWEATING *NAUSEA AND VOMITING THAT IS NOT CONTROLLED WITH YOUR NAUSEA MEDICATION *UNUSUAL SHORTNESS OF BREATH *UNUSUAL BRUISING OR BLEEDING *URINARY PROBLEMS (pain or burning when urinating, or frequent urination) *BOWEL PROBLEMS (unusual diarrhea, constipation, pain near the anus) TENDERNESS IN MOUTH AND THROAT WITH OR WITHOUT PRESENCE OF ULCERS (sore throat, sores in mouth, or a toothache) UNUSUAL RASH, SWELLING OR PAIN  UNUSUAL VAGINAL DISCHARGE OR ITCHING   Items with * indicate a potential emergency and should be followed up as soon as possible or go to the Emergency Department if any problems should occur.  Please show the CHEMOTHERAPY ALERT CARD or IMMUNOTHERAPY ALERT CARD at  check-in to the Emergency Department and triage nurse.  Should you have questions after your visit or need to cancel or reschedule your appointment, please contact [facility]  Dept: [phone]  and follow the prompts.  Office hours are 8:00 a.m. to 4:30 p.m. Monday - Friday. Please note that voicemails left after 4:00 p.m. may not be returned until the following business day.  We are closed weekends and major holidays. You have access to a nurse at all times for urgent questions. Please call the main number to the clinic Dept: [phone] and follow the prompts.   For any non-urgent questions, you may also contact your provider using MyChart. We now offer e-Visits for anyone 75 and older to request care online for non-urgent symptoms. For details visit [email or web address].   Also download the MyChart app! Go to the app store, search "MyChart", open the app, select [facility], and log in with your MyChart username and password.  Due to Covid, a mask is required upon entering the hospital/clinic. If you do not have a mask, one will be given to you upon arrival. For doctor visits, patients may have 1 support person aged 51 or older with them. For treatment visits, patients cannot have anyone with them due to current Covid guidelines and our immunocompromised population.
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
## 2021-09-08 ENCOUNTER — Other Ambulatory Visit: Payer: Self-pay

## 2021-09-08 ENCOUNTER — Ambulatory Visit
Admission: RE | Admit: 2021-09-08 | Discharge: 2021-09-08 | Disposition: A | Discharge status: Home / Self Care | Payer: Medicaid Other | Source: Ambulatory Visit | Attending: Radiation Oncology | Admitting: Radiation Oncology

## 2021-09-08 ENCOUNTER — Inpatient Hospital Stay: Payer: Medicaid Other

## 2021-09-08 ENCOUNTER — Other Ambulatory Visit: Payer: Self-pay | Admitting: Hematology and Oncology

## 2021-09-08 VITALS — BP 114/89 | HR 92 | Temp 97.8°F | Resp 18

## 2021-09-08 DIAGNOSIS — C3490 Malignant neoplasm of unspecified part of unspecified bronchus or lung: Secondary | ICD-10-CM

## 2021-09-08 DIAGNOSIS — C3432 Malignant neoplasm of lower lobe, left bronchus or lung: Secondary | ICD-10-CM

## 2021-09-08 DIAGNOSIS — Z5111 Encounter for antineoplastic chemotherapy: Secondary | ICD-10-CM | POA: Diagnosis not present

## 2021-09-08 LAB — RAD ONC ARIA SESSION SUMMARY
Course Elapsed Days: 23
Plan Fractions Treated to Date: 18
Plan Prescribed Dose Per Fraction: 2 Gy
Plan Total Fractions Prescribed: 33
Plan Total Prescribed Dose: 66 Gy
Reference Point Dosage Given to Date: 36 Gy
Reference Point Session Dosage Given: 2 Gy
Session Number: 18

## 2021-09-08 MED ORDER — SODIUM CHLORIDE 0.9 % IV SOLN: Freq: Once | INTRAVENOUS | Status: AC

## 2021-09-08 MED ORDER — OXYCODONE HCL 5 MG PO TABS
5.0000 mg | ORAL_TABLET | Freq: Once | ORAL | Status: AC
  Administered 2021-09-08: 5 mg via ORAL
  Filled 2021-09-08: qty 1

## 2021-09-08 MED ORDER — SODIUM CHLORIDE 0.9 % IV SOLN
10.0000 mg | Freq: Once | INTRAVENOUS | Status: AC
  Administered 2021-09-08: 10 mg via INTRAVENOUS
  Filled 2021-09-08: qty 10

## 2021-09-08 MED ORDER — HEPARIN SOD (PORK) LOCK FLUSH 100 UNIT/ML IV SOLN
500.0000 [IU] | Freq: Once | INTRAVENOUS | Status: AC | PRN
  Administered 2021-09-08: 500 [IU]

## 2021-09-08 MED ORDER — ANORO ELLIPTA 62.5-25 MCG/ACT IN AEPB
1.0000 | INHALATION_SPRAY | Freq: Every day | RESPIRATORY_TRACT | 11 refills | Status: DC
  Filled 2021-09-08: qty 60, 30d supply, fill #0

## 2021-09-08 MED ORDER — SODIUM CHLORIDE 0.9% FLUSH
10.0000 mL | INTRAVENOUS | Status: DC | PRN
  Administered 2021-09-08: 10 mL

## 2021-09-08 MED ORDER — SODIUM CHLORIDE 0.9 % IV SOLN
100.0000 mg/m2 | Freq: Once | INTRAVENOUS | Status: AC
  Administered 2021-09-08: 160 mg via INTRAVENOUS
  Filled 2021-09-08: qty 8

## 2021-09-08 NOTE — Patient Instructions (Signed)
[facility]COLOG[facility]  Discharge Instructions: Thank you for choosing [facility] to provide your oncology and hematology care.   If you have a lab appointment with the [facility], please go directly to the [facility] and check in at the registration area.   Wear comfortable clothing and clothing appropriate for easy access to any Portacath or PICC line.   We strive to give you quality time with your provider. You may need to reschedule your appointment if you arrive late (15 or more minutes).  Arriving late affects you and other patients whose appointments are after yours.  Also, if you miss three or more appointments without notifying the office, you may be dismissed from the clinic at the provider's discretion.      For prescription refill requests, have your pharmacy contact our office and allow 72 hours for refills to be completed.    Today you received the following chemotherapy and/or immunotherapy agents: Etoposide   To help prevent nausea and vomiting after your treatment, we encourage you to take your nausea medication as directed.  BELOW ARE SYMPTOMS THAT SHOULD BE REPORTED IMMEDIATELY: *FEVER GREATER THAN 100.4 F (38 C) OR HIGHER *CHILLS OR SWEATING *NAUSEA AND VOMITING THAT IS NOT CONTROLLED WITH YOUR NAUSEA MEDICATION *UNUSUAL SHORTNESS OF BREATH *UNUSUAL BRUISING OR BLEEDING *URINARY PROBLEMS (pain or burning when urinating, or frequent urination) *BOWEL PROBLEMS (unusual diarrhea, constipation, pain near the anus) TENDERNESS IN MOUTH AND THROAT WITH OR WITHOUT PRESENCE OF ULCERS (sore throat, sores in mouth, or a toothache) UNUSUAL RASH, SWELLING OR PAIN  UNUSUAL VAGINAL DISCHARGE OR ITCHING   Items with * indicate a potential emergency and should be followed up as soon as possible or go to the Emergency Department if any problems should occur.  Please show the CHEMOTHERAPY ALERT CARD or IMMUNOTHERAPY ALERT CARD at check-in to the  Emergency Department and triage nurse.  Should you have questions after your visit or need to cancel or reschedule your appointment, please contact [facility]  Dept: [phone]  and follow the prompts.  Office hours are 8:00 a.m. to 4:30 p.m. Monday - Friday. Please note that voicemails left after 4:00 p.m. may not be returned until the following business day.  We are closed weekends and major holidays. You have access to a nurse at all times for urgent questions. Please call the main number to the clinic Dept: [phone] and follow the prompts.   For any non-urgent questions, you may also contact your provider using MyChart. We now offer e-Visits for anyone 30 and older to request care online for non-urgent symptoms. For details visit [email or web address].   Also download the MyChart app! Go to the app store, search "MyChart", open the app, select [facility], and log in with your MyChart username and password.  Due to Covid, a mask is required upon entering the hospital/clinic. If you do not have a mask, one will be given to you upon arrival. For doctor visits, patients may have 1 support person aged 4 or older with them. For treatment visits, patients cannot have anyone with them due to current Covid guidelines and our immunocompromised population.

## 2021-09-08 NOTE — Telephone Encounter (Signed)
I have called patient. He is self-pay and Anoro is not affordable. I discussed with him that [facility] can provide patient assistance and to call the pharmacy tomorrow morning to ask what the cost of the inhaler is with assistance. He expresses understanding and appreciates the call. He will call again if he has any issues.

## 2021-09-09 ENCOUNTER — Inpatient Hospital Stay: Payer: Medicaid Other

## 2021-09-09 ENCOUNTER — Other Ambulatory Visit: Payer: Self-pay | Admitting: Hematology and Oncology

## 2021-09-09 ENCOUNTER — Ambulatory Visit
Admission: RE | Admit: 2021-09-09 | Discharge: 2021-09-09 | Disposition: A | Discharge status: Home / Self Care | Payer: Medicaid Other | Source: Ambulatory Visit | Attending: Radiation Oncology | Admitting: Radiation Oncology

## 2021-09-09 ENCOUNTER — Encounter: Payer: Self-pay | Admitting: Hematology and Oncology

## 2021-09-09 ENCOUNTER — Other Ambulatory Visit: Payer: Self-pay | Admitting: *Deleted

## 2021-09-09 ENCOUNTER — Telehealth: Payer: Self-pay | Admitting: Hematology and Oncology

## 2021-09-09 ENCOUNTER — Telehealth: Payer: Self-pay

## 2021-09-09 ENCOUNTER — Encounter (HOSPITAL_COMMUNITY): Admission: RE | Payer: Self-pay | Source: Home / Self Care

## 2021-09-09 ENCOUNTER — Other Ambulatory Visit: Payer: Self-pay | Admitting: Radiation Oncology

## 2021-09-09 ENCOUNTER — Other Ambulatory Visit: Payer: Self-pay

## 2021-09-09 ENCOUNTER — Ambulatory Visit (HOSPITAL_COMMUNITY): Admission: RE | Admit: 2021-09-09 | Payer: Self-pay | Source: Home / Self Care | Admitting: Gastroenterology

## 2021-09-09 VITALS — BP 110/84 | HR 91 | Temp 98.1°F | Resp 18

## 2021-09-09 DIAGNOSIS — C3432 Malignant neoplasm of lower lobe, left bronchus or lung: Secondary | ICD-10-CM

## 2021-09-09 DIAGNOSIS — Z5111 Encounter for antineoplastic chemotherapy: Secondary | ICD-10-CM | POA: Diagnosis not present

## 2021-09-09 DIAGNOSIS — C3490 Malignant neoplasm of unspecified part of unspecified bronchus or lung: Secondary | ICD-10-CM

## 2021-09-09 LAB — ACID FAST CULTURE WITH REFLEXED SENSITIVITIES (MYCOBACTERIA): Acid Fast Culture: NEGATIVE

## 2021-09-09 LAB — RAD ONC ARIA SESSION SUMMARY
Course Elapsed Days: 24
Plan Fractions Treated to Date: 19
Plan Prescribed Dose Per Fraction: 2 Gy
Plan Total Fractions Prescribed: 33
Plan Total Prescribed Dose: 66 Gy
Reference Point Dosage Given to Date: 38 Gy
Reference Point Session Dosage Given: 2 Gy
Session Number: 19

## 2021-09-09 SURGERY — COLONOSCOPY WITH PROPOFOL
Anesthesia: Monitor Anesthesia Care

## 2021-09-09 MED ORDER — SODIUM CHLORIDE 0.9 % IV SOLN
100.0000 mg/m2 | Freq: Once | INTRAVENOUS | Status: AC
  Administered 2021-09-09: 160 mg via INTRAVENOUS
  Filled 2021-09-09: qty 8

## 2021-09-09 MED ORDER — CITALOPRAM HYDROBROMIDE 20 MG PO TABS: 20.0000 mg | ORAL_TABLET | Freq: Every day | ORAL | 2 refills | Status: DC

## 2021-09-09 MED ORDER — SODIUM CHLORIDE 0.9% FLUSH
10.0000 mL | INTRAVENOUS | Status: DC | PRN
  Administered 2021-09-09: 10 mL

## 2021-09-09 MED ORDER — SODIUM CHLORIDE 0.9 % IV SOLN
10.0000 mg | Freq: Once | INTRAVENOUS | Status: AC
  Administered 2021-09-09: 10 mg via INTRAVENOUS
  Filled 2021-09-09: qty 10

## 2021-09-09 MED ORDER — SODIUM CHLORIDE 0.9 % IV SOLN: Freq: Once | INTRAVENOUS | Status: AC

## 2021-09-09 MED ORDER — HEPARIN SOD (PORK) LOCK FLUSH 100 UNIT/ML IV SOLN
500.0000 [IU] | Freq: Once | INTRAVENOUS | Status: AC | PRN
  Administered 2021-09-09: 500 [IU]

## 2021-09-09 MED ORDER — ANORO ELLIPTA 62.5-25 MCG/ACT IN AEPB: 1.0000 | INHALATION_SPRAY | Freq: Every day | RESPIRATORY_TRACT | 11 refills | Status: DC

## 2021-09-09 NOTE — Telephone Encounter (Signed)
Please order Anoro. Ok to have provider of the day sign and have it faxed per below request.

## 2021-09-09 NOTE — Telephone Encounter (Signed)
Returned call to patient, verified identity in reference to need for medication for depression.  Spoke with Dr. Kathrynn Running, patient has a medication already in chart so no new orders.  Patient was upset and asked to speak with Dr. Derek Mound nurse transferred call as requested.

## 2021-09-09 NOTE — Telephone Encounter (Signed)
I received a telephone call from the operator.  I reviewed the patient's chart.  He called earlier this morning, review his concern about depression with his radiation oncologist.  Initially, he was told that Prozac would be prescribed.  Subsequently, he received a telephone call stated that Prozac will not be prescribed due to potential interaction with his other medications.  Dr. Derek Mound nurse called the patient this evening and send in prescription of citalopram instead.  The patient was very upset when he was told that it would take several weeks for citalopram to work.  He expressed suicidal ideation with the operator but refused to go to the hospital to be evaluated.  I reviewed his chart and documentation from today.  I spoke with the patient.  I informed the patient that whether he was prescribed Prozac or Celexa, typically antidepressant will take several weeks to work.  I noted that he is on trazodone and I recommend the patient to increase the dose of the trazodone to see if that would help with his mood.  The patient expressed a lot of frustration.  He does not want to wait and he wants medication that would make him feel better right away.  He asked why physicians would refuse to prescribe something that would make everybody feel better that he cannot have gotten from the street?  I asked the patient what medicine will make him feel better right away?  He told me that whenever he takes Xanax or the little blue pill, it calms him down and make him feel better right away.  I am concerned about the patient's safety as I see he is on multiple medications that could cause risk of sedation.  Ultimately, I acknowledged my limitation as a physician who is not trained to treat depression/anxiety and overall, I expressed my concern that I am not comfortable prescribing Xanax or lorazepam.  I will review the case with his oncologist tomorrow.  For tonight, I recommend the patient to try 75 mg trazodone  instead of 50 mg and to await for further instruction tomorrow by his oncologist

## 2021-09-12 ENCOUNTER — Ambulatory Visit
Admission: RE | Admit: 2021-09-12 | Discharge: 2021-09-12 | Disposition: A | Discharge status: Home / Self Care | Payer: Medicaid Other | Source: Ambulatory Visit | Attending: Radiation Oncology | Admitting: Radiation Oncology

## 2021-09-12 ENCOUNTER — Telehealth: Payer: Self-pay | Admitting: Hematology and Oncology

## 2021-09-12 ENCOUNTER — Other Ambulatory Visit: Payer: Self-pay

## 2021-09-12 DIAGNOSIS — C3432 Malignant neoplasm of lower lobe, left bronchus or lung: Secondary | ICD-10-CM | POA: Diagnosis not present

## 2021-09-12 LAB — RAD ONC ARIA SESSION SUMMARY
Course Elapsed Days: 27
Plan Fractions Treated to Date: 20
Plan Prescribed Dose Per Fraction: 2 Gy
Plan Total Fractions Prescribed: 33
Plan Total Prescribed Dose: 66 Gy
Reference Point Dosage Given to Date: 40 Gy
Reference Point Session Dosage Given: 2 Gy
Session Number: 20

## 2021-09-13 ENCOUNTER — Ambulatory Visit
Admission: RE | Admit: 2021-09-13 | Discharge: 2021-09-13 | Disposition: A | Discharge status: Home / Self Care | Payer: Medicaid Other | Source: Ambulatory Visit | Attending: Radiation Oncology | Admitting: Radiation Oncology

## 2021-09-13 ENCOUNTER — Other Ambulatory Visit: Payer: Self-pay

## 2021-09-13 ENCOUNTER — Telehealth: Payer: Self-pay | Admitting: Pulmonary Disease

## 2021-09-13 ENCOUNTER — Inpatient Hospital Stay: Payer: Medicaid Other | Admitting: Licensed Clinical Social Worker

## 2021-09-13 DIAGNOSIS — C3432 Malignant neoplasm of lower lobe, left bronchus or lung: Secondary | ICD-10-CM

## 2021-09-13 LAB — RAD ONC ARIA SESSION SUMMARY
Course Elapsed Days: 28
Plan Fractions Treated to Date: 21
Plan Prescribed Dose Per Fraction: 2 Gy
Plan Total Fractions Prescribed: 33
Plan Total Prescribed Dose: 66 Gy
Reference Point Dosage Given to Date: 42 Gy
Reference Point Session Dosage Given: 2 Gy
Session Number: 21

## 2021-09-13 MED ORDER — ANORO ELLIPTA 62.5-25 MCG/ACT IN AEPB: 1.0000 | INHALATION_SPRAY | Freq: Every day | RESPIRATORY_TRACT | 3 refills | Status: DC

## 2021-09-13 NOTE — Telephone Encounter (Signed)
He is stage IIIB cancer. There are four total stages. His biopsy was completed on 07/26/21.
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
## 2021-09-14 ENCOUNTER — Encounter: Payer: Self-pay | Admitting: General Practice

## 2021-09-14 ENCOUNTER — Inpatient Hospital Stay: Payer: Medicaid Other | Admitting: Dietician

## 2021-09-14 ENCOUNTER — Ambulatory Visit
Admission: RE | Admit: 2021-09-14 | Discharge: 2021-09-14 | Disposition: A | Discharge status: Home / Self Care | Payer: Medicaid Other | Source: Ambulatory Visit | Attending: Radiation Oncology | Admitting: Radiation Oncology

## 2021-09-14 ENCOUNTER — Other Ambulatory Visit: Payer: Self-pay

## 2021-09-14 ENCOUNTER — Telehealth: Payer: Self-pay | Admitting: *Deleted

## 2021-09-14 DIAGNOSIS — C3432 Malignant neoplasm of lower lobe, left bronchus or lung: Secondary | ICD-10-CM | POA: Diagnosis not present

## 2021-09-14 LAB — RAD ONC ARIA SESSION SUMMARY
Course Elapsed Days: 29
Plan Fractions Treated to Date: 22
Plan Prescribed Dose Per Fraction: 2 Gy
Plan Total Fractions Prescribed: 33
Plan Total Prescribed Dose: 66 Gy
Reference Point Dosage Given to Date: 44 Gy
Reference Point Session Dosage Given: 2 Gy
Session Number: 22

## 2021-09-14 NOTE — Progress Notes (Signed)
[facility] Spiritual Care Note  Followed up with Dakota Bryant by phone. He was more down today than in other recent contacts, citing reasons present in [place] note by Manuela Schwartz Eder/LCSW--in particular, the struggle to find beneficial/affordable food to eat, the recent constipation/irregularity, social isolation, and inability to work. Faith remains an important coping tool, and he is thankful for dietician's help. Spiritual Care conversations are a source of connection and encouragement that assist with faith support and reducing social isolation. We plan to follow up by phone next week for another pastoral check-in.   Ruth, [place], [facility] Pager [phone] Voicemail [phone]

## 2021-09-14 NOTE — Telephone Encounter (Signed)
Received vm message from Dakota Bryant requesting call back regarding constipation. Dakota Bryant patient and spoke with him.  He states that his constipation is problematic since Sunday.  He is only taking stool softener BID. Discussed option of Miralax or Senna S. Instructed on use of both. Dakota Bryant thinks he will try the Senna S after taking some milk of magnesia.  His other issue is that he tried the Celexa and he states it made want to jump out of his skin. He took it twice over the weekend but will not take it again. He has spoken with SW and our chaplain. He states he is appreciative of their support and thinks that will be enough for now.  Dr. Lorenso Courier made aware of the above.
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
## 2021-09-15 ENCOUNTER — Other Ambulatory Visit: Payer: Self-pay

## 2021-09-15 ENCOUNTER — Other Ambulatory Visit (HOSPITAL_COMMUNITY): Payer: Self-pay

## 2021-09-15 ENCOUNTER — Ambulatory Visit
Admission: RE | Admit: 2021-09-15 | Discharge: 2021-09-15 | Disposition: A | Discharge status: Home / Self Care | Payer: Medicaid Other | Source: Ambulatory Visit | Attending: Radiation Oncology | Admitting: Radiation Oncology

## 2021-09-15 ENCOUNTER — Encounter: Payer: Self-pay | Admitting: Hematology and Oncology

## 2021-09-15 DIAGNOSIS — C3432 Malignant neoplasm of lower lobe, left bronchus or lung: Secondary | ICD-10-CM | POA: Diagnosis not present

## 2021-09-15 LAB — RAD ONC ARIA SESSION SUMMARY
Course Elapsed Days: 30
Plan Fractions Treated to Date: 23
Plan Prescribed Dose Per Fraction: 2 Gy
Plan Total Fractions Prescribed: 33
Plan Total Prescribed Dose: 66 Gy
Reference Point Dosage Given to Date: 46 Gy
Reference Point Session Dosage Given: 2 Gy
Session Number: 23

## 2021-09-16 ENCOUNTER — Ambulatory Visit
Admission: RE | Admit: 2021-09-16 | Discharge: 2021-09-16 | Disposition: A | Discharge status: Home / Self Care | Payer: Medicaid Other | Source: Ambulatory Visit | Attending: Radiation Oncology | Admitting: Radiation Oncology

## 2021-09-16 ENCOUNTER — Other Ambulatory Visit: Payer: Self-pay

## 2021-09-16 DIAGNOSIS — C3432 Malignant neoplasm of lower lobe, left bronchus or lung: Secondary | ICD-10-CM

## 2021-09-16 LAB — RAD ONC ARIA SESSION SUMMARY
Course Elapsed Days: 31
Plan Fractions Treated to Date: 24
Plan Prescribed Dose Per Fraction: 2 Gy
Plan Total Fractions Prescribed: 33
Plan Total Prescribed Dose: 66 Gy
Reference Point Dosage Given to Date: 48 Gy
Reference Point Session Dosage Given: 2 Gy
Session Number: 24

## 2021-09-16 MED ORDER — SONAFINE EX EMUL
1.0000 | Freq: Once | CUTANEOUS | Status: AC
  Administered 2021-09-16: 1 via TOPICAL

## 2021-09-19 ENCOUNTER — Other Ambulatory Visit: Payer: Self-pay

## 2021-09-19 ENCOUNTER — Telehealth: Payer: Self-pay | Admitting: Pulmonary Disease

## 2021-09-19 ENCOUNTER — Ambulatory Visit
Admission: RE | Admit: 2021-09-19 | Discharge: 2021-09-19 | Disposition: A | Discharge status: Home / Self Care | Payer: Medicaid Other | Source: Ambulatory Visit | Attending: Radiation Oncology | Admitting: Radiation Oncology

## 2021-09-19 DIAGNOSIS — C3432 Malignant neoplasm of lower lobe, left bronchus or lung: Secondary | ICD-10-CM | POA: Diagnosis not present

## 2021-09-19 LAB — RAD ONC ARIA SESSION SUMMARY
Course Elapsed Days: 34
Plan Fractions Treated to Date: 25
Plan Prescribed Dose Per Fraction: 2 Gy
Plan Total Fractions Prescribed: 33
Plan Total Prescribed Dose: 66 Gy
Reference Point Dosage Given to Date: 50 Gy
Reference Point Session Dosage Given: 2 Gy
Session Number: 25

## 2021-09-19 NOTE — Telephone Encounter (Signed)
Received a faxed request from [facility] requesting office notes and test results from 08/18/2020 to the present.  Faxed notes and bronchoscopy results to fax# [phone].  Included a note that patient has a PFT and office visit scheduled with Dr. Loanne Drilling on 10/24/2021.

## 2021-09-21 ENCOUNTER — Other Ambulatory Visit: Payer: Self-pay

## 2021-09-21 ENCOUNTER — Ambulatory Visit
Admission: RE | Admit: 2021-09-21 | Discharge: 2021-09-21 | Disposition: A | Discharge status: Home / Self Care | Payer: Medicaid Other | Source: Ambulatory Visit | Attending: Radiation Oncology | Admitting: Radiation Oncology

## 2021-09-21 DIAGNOSIS — C3432 Malignant neoplasm of lower lobe, left bronchus or lung: Secondary | ICD-10-CM | POA: Diagnosis not present

## 2021-09-21 LAB — RAD ONC ARIA SESSION SUMMARY
Course Elapsed Days: 36
Plan Fractions Treated to Date: 26
Plan Prescribed Dose Per Fraction: 2 Gy
Plan Total Fractions Prescribed: 33
Plan Total Prescribed Dose: 66 Gy
Reference Point Dosage Given to Date: 52 Gy
Reference Point Session Dosage Given: 2 Gy
Session Number: 26

## 2021-09-22 ENCOUNTER — Other Ambulatory Visit: Payer: Self-pay

## 2021-09-22 ENCOUNTER — Ambulatory Visit
Admission: RE | Admit: 2021-09-22 | Discharge: 2021-09-22 | Disposition: A | Discharge status: Home / Self Care | Payer: Medicaid Other | Source: Ambulatory Visit | Attending: Radiation Oncology | Admitting: Radiation Oncology

## 2021-09-22 DIAGNOSIS — C3432 Malignant neoplasm of lower lobe, left bronchus or lung: Secondary | ICD-10-CM | POA: Diagnosis not present

## 2021-09-22 LAB — RAD ONC ARIA SESSION SUMMARY
Course Elapsed Days: 37
Plan Fractions Treated to Date: 27
Plan Prescribed Dose Per Fraction: 2 Gy
Plan Total Fractions Prescribed: 33
Plan Total Prescribed Dose: 66 Gy
Reference Point Dosage Given to Date: 54 Gy
Reference Point Session Dosage Given: 2 Gy
Session Number: 27

## 2021-09-23 ENCOUNTER — Other Ambulatory Visit: Payer: Self-pay

## 2021-09-23 ENCOUNTER — Ambulatory Visit
Admission: RE | Admit: 2021-09-23 | Discharge: 2021-09-23 | Disposition: A | Discharge status: Home / Self Care | Payer: Medicaid Other | Source: Ambulatory Visit | Attending: Radiation Oncology | Admitting: Radiation Oncology

## 2021-09-23 DIAGNOSIS — C3432 Malignant neoplasm of lower lobe, left bronchus or lung: Secondary | ICD-10-CM

## 2021-09-23 LAB — RAD ONC ARIA SESSION SUMMARY
Course Elapsed Days: 38
Plan Fractions Treated to Date: 28
Plan Prescribed Dose Per Fraction: 2 Gy
Plan Total Fractions Prescribed: 33
Plan Total Prescribed Dose: 66 Gy
Reference Point Dosage Given to Date: 56 Gy
Reference Point Session Dosage Given: 2 Gy
Session Number: 28

## 2021-09-26 ENCOUNTER — Other Ambulatory Visit: Payer: Self-pay

## 2021-09-26 ENCOUNTER — Ambulatory Visit
Admission: RE | Admit: 2021-09-26 | Discharge: 2021-09-26 | Disposition: A | Discharge status: Home / Self Care | Payer: Medicaid Other | Source: Ambulatory Visit | Attending: Radiation Oncology | Admitting: Radiation Oncology

## 2021-09-26 ENCOUNTER — Ambulatory Visit: Payer: Medicaid Other

## 2021-09-26 DIAGNOSIS — C3432 Malignant neoplasm of lower lobe, left bronchus or lung: Secondary | ICD-10-CM | POA: Diagnosis not present

## 2021-09-26 LAB — RAD ONC ARIA SESSION SUMMARY
Course Elapsed Days: 41
Plan Fractions Treated to Date: 29
Plan Prescribed Dose Per Fraction: 2 Gy
Plan Total Fractions Prescribed: 33
Plan Total Prescribed Dose: 66 Gy
Reference Point Dosage Given to Date: 58 Gy
Reference Point Session Dosage Given: 2 Gy
Session Number: 29

## 2021-09-27 ENCOUNTER — Other Ambulatory Visit: Payer: Self-pay

## 2021-09-27 ENCOUNTER — Ambulatory Visit
Admission: RE | Admit: 2021-09-27 | Discharge: 2021-09-27 | Disposition: A | Discharge status: Home / Self Care | Payer: Medicaid Other | Source: Ambulatory Visit | Attending: Radiation Oncology | Admitting: Radiation Oncology

## 2021-09-27 ENCOUNTER — Inpatient Hospital Stay (HOSPITAL_BASED_OUTPATIENT_CLINIC_OR_DEPARTMENT_OTHER): Payer: Medicaid Other | Admitting: Hematology and Oncology

## 2021-09-27 ENCOUNTER — Encounter: Payer: Self-pay | Admitting: Hematology and Oncology

## 2021-09-27 ENCOUNTER — Other Ambulatory Visit: Payer: Self-pay | Admitting: Hematology and Oncology

## 2021-09-27 ENCOUNTER — Inpatient Hospital Stay: Payer: Medicaid Other | Attending: Hematology and Oncology

## 2021-09-27 VITALS — BP 115/80 | HR 75 | Temp 97.8°F | Resp 15 | Wt 127.6 lb

## 2021-09-27 DIAGNOSIS — R21 Rash and other nonspecific skin eruption: Secondary | ICD-10-CM | POA: Diagnosis not present

## 2021-09-27 DIAGNOSIS — F1090 Alcohol use, unspecified, uncomplicated: Secondary | ICD-10-CM | POA: Insufficient documentation

## 2021-09-27 DIAGNOSIS — Z95828 Presence of other vascular implants and grafts: Secondary | ICD-10-CM | POA: Insufficient documentation

## 2021-09-27 DIAGNOSIS — F17210 Nicotine dependence, cigarettes, uncomplicated: Secondary | ICD-10-CM | POA: Insufficient documentation

## 2021-09-27 DIAGNOSIS — Z79899 Other long term (current) drug therapy: Secondary | ICD-10-CM | POA: Insufficient documentation

## 2021-09-27 DIAGNOSIS — Z5111 Encounter for antineoplastic chemotherapy: Secondary | ICD-10-CM | POA: Diagnosis not present

## 2021-09-27 DIAGNOSIS — C3432 Malignant neoplasm of lower lobe, left bronchus or lung: Secondary | ICD-10-CM | POA: Diagnosis not present

## 2021-09-27 DIAGNOSIS — C3490 Malignant neoplasm of unspecified part of unspecified bronchus or lung: Secondary | ICD-10-CM

## 2021-09-27 DIAGNOSIS — R52 Pain, unspecified: Secondary | ICD-10-CM | POA: Insufficient documentation

## 2021-09-27 LAB — CMP (CANCER CENTER ONLY)
ALT: 9 U/L (ref 0–44)
AST: 13 U/L — ABNORMAL LOW (ref 15–41)
Albumin: 4.1 g/dL (ref 3.5–5.0)
Alkaline Phosphatase: 40 U/L (ref 38–126)
Anion gap: 4 — ABNORMAL LOW (ref 5–15)
BUN: 6 mg/dL (ref 6–20)
CO2: 28 mmol/L (ref 22–32)
Calcium: 9.2 mg/dL (ref 8.9–10.3)
Chloride: 93 mmol/L — ABNORMAL LOW (ref 98–111)
Creatinine: 0.6 mg/dL — ABNORMAL LOW (ref 0.61–1.24)
GFR, Estimated: >60 mL/min (ref >60)
Glucose, Bld: 89 mg/dL (ref 70–99)
Potassium: 4.1 mmol/L (ref 3.5–5.1)
Sodium: 125 mmol/L — ABNORMAL LOW (ref 135–145)
Total Bilirubin: 0.5 mg/dL (ref 0.3–1.2)
Total Protein: 6.3 g/dL — ABNORMAL LOW (ref 6.5–8.1)

## 2021-09-27 LAB — RAD ONC ARIA SESSION SUMMARY
Course Elapsed Days: 42
Plan Fractions Treated to Date: 30
Plan Prescribed Dose Per Fraction: 2 Gy
Plan Total Fractions Prescribed: 33
Plan Total Prescribed Dose: 66 Gy
Reference Point Dosage Given to Date: 60 Gy
Reference Point Session Dosage Given: 2 Gy
Session Number: 30

## 2021-09-27 LAB — CBC WITH DIFFERENTIAL (CANCER CENTER ONLY)
Abs Immature Granulocytes: 0.01 10*3/uL (ref 0.00–0.07)
Basophils Absolute: 0 10*3/uL (ref 0.0–0.1)
Basophils Relative: 1 %
Eosinophils Absolute: 0.1 10*3/uL (ref 0.0–0.5)
Eosinophils Relative: 5 %
HCT: 34.9 % — ABNORMAL LOW (ref 39.0–52.0)
Hemoglobin: 13.1 g/dL (ref 13.0–17.0)
Immature Granulocytes: 0 %
Lymphocytes Relative: 26 %
Lymphs Abs: 0.6 10*3/uL — ABNORMAL LOW (ref 0.7–4.0)
MCH: 33.4 pg (ref 26.0–34.0)
MCHC: 37.5 g/dL — ABNORMAL HIGH (ref 30.0–36.0)
MCV: 89 fL (ref 80.0–100.0)
Monocytes Absolute: 0.5 10*3/uL (ref 0.1–1.0)
Monocytes Relative: 21 %
Neutro Abs: 1 10*3/uL — ABNORMAL LOW (ref 1.7–7.7)
Neutrophils Relative %: 47 %
Platelet Count: 161 10*3/uL (ref 150–400)
RBC: 3.92 MIL/uL — ABNORMAL LOW (ref 4.22–5.81)
RDW: 15 % (ref 11.5–15.5)
WBC Count: 2.2 10*3/uL — ABNORMAL LOW (ref 4.0–10.5)
nRBC: 0 % (ref 0.0–0.2)

## 2021-09-27 LAB — TSH: TSH: 3.338 u[IU]/mL (ref 0.350–4.500)

## 2021-09-27 MED ORDER — SODIUM CHLORIDE 0.9% FLUSH
10.0000 mL | Freq: Once | INTRAVENOUS | Status: AC
  Administered 2021-09-27: 10 mL

## 2021-09-27 MED ORDER — HEPARIN SOD (PORK) LOCK FLUSH 100 UNIT/ML IV SOLN
500.0000 [IU] | Freq: Once | INTRAVENOUS | Status: AC
  Administered 2021-09-27: 500 [IU]

## 2021-09-27 MED ORDER — TAMSULOSIN HCL 0.4 MG PO CAPS: 0.4000 mg | ORAL_CAPSULE | Freq: Every day | ORAL | 2 refills | Status: DC
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
## 2021-09-28 ENCOUNTER — Inpatient Hospital Stay: Payer: Medicaid Other

## 2021-09-28 ENCOUNTER — Other Ambulatory Visit: Payer: Self-pay

## 2021-09-28 ENCOUNTER — Ambulatory Visit
Admission: RE | Admit: 2021-09-28 | Discharge: 2021-09-28 | Disposition: A | Discharge status: Home / Self Care | Payer: Medicaid Other | Source: Ambulatory Visit | Attending: Radiation Oncology | Admitting: Radiation Oncology

## 2021-09-28 VITALS — BP 99/64 | HR 77 | Temp 98.3°F | Resp 18

## 2021-09-28 DIAGNOSIS — C3432 Malignant neoplasm of lower lobe, left bronchus or lung: Secondary | ICD-10-CM

## 2021-09-28 DIAGNOSIS — Z5111 Encounter for antineoplastic chemotherapy: Secondary | ICD-10-CM | POA: Diagnosis not present

## 2021-09-28 LAB — RAD ONC ARIA SESSION SUMMARY
Course Elapsed Days: 43
Plan Fractions Treated to Date: 31
Plan Prescribed Dose Per Fraction: 2 Gy
Plan Total Fractions Prescribed: 33
Plan Total Prescribed Dose: 66 Gy
Reference Point Dosage Given to Date: 62 Gy
Reference Point Session Dosage Given: 2 Gy
Session Number: 31

## 2021-09-28 MED ORDER — SODIUM CHLORIDE 0.9 % IV SOLN
80.0000 mg/m2 | Freq: Once | INTRAVENOUS | Status: AC
  Administered 2021-09-28: 130 mg via INTRAVENOUS
  Filled 2021-09-28: qty 130

## 2021-09-28 MED ORDER — SODIUM CHLORIDE 0.9 % IV SOLN
150.0000 mg | Freq: Once | INTRAVENOUS | Status: AC
  Administered 2021-09-28: 150 mg via INTRAVENOUS
  Filled 2021-09-28: qty 150

## 2021-09-28 MED ORDER — PALONOSETRON HCL INJECTION 0.25 MG/5ML
0.2500 mg | Freq: Once | INTRAVENOUS | Status: AC
  Administered 2021-09-28: 0.25 mg via INTRAVENOUS
  Filled 2021-09-28: qty 5

## 2021-09-28 MED ORDER — POTASSIUM CHLORIDE IN NACL 20-0.9 MEQ/L-% IV SOLN
Freq: Once | INTRAVENOUS | Status: AC
  Filled 2021-09-28: qty 1000

## 2021-09-28 MED ORDER — SODIUM CHLORIDE 0.9% FLUSH
10.0000 mL | INTRAVENOUS | Status: DC | PRN
  Administered 2021-09-28: 10 mL

## 2021-09-28 MED ORDER — MAGNESIUM SULFATE 2 GM/50ML IV SOLN
2.0000 g | Freq: Once | INTRAVENOUS | Status: AC
  Administered 2021-09-28: 2 g via INTRAVENOUS
  Filled 2021-09-28: qty 50

## 2021-09-28 MED ORDER — SODIUM CHLORIDE 0.9 % IV SOLN
10.0000 mg | Freq: Once | INTRAVENOUS | Status: AC
  Administered 2021-09-28: 10 mg via INTRAVENOUS
  Filled 2021-09-28: qty 10

## 2021-09-28 MED ORDER — SODIUM CHLORIDE 0.9 % IV SOLN: Freq: Once | INTRAVENOUS | Status: AC

## 2021-09-28 MED ORDER — SODIUM CHLORIDE 0.9 % IV SOLN
100.0000 mg/m2 | Freq: Once | INTRAVENOUS | Status: AC
  Administered 2021-09-28: 160 mg via INTRAVENOUS
  Filled 2021-09-28: qty 8

## 2021-09-28 MED ORDER — HEPARIN SOD (PORK) LOCK FLUSH 100 UNIT/ML IV SOLN
500.0000 [IU] | Freq: Once | INTRAVENOUS | Status: AC | PRN
  Administered 2021-09-28: 500 [IU]

## 2021-09-28 MED FILL — Dexamethasone Sodium Phosphate Inj 100 MG/10ML: INTRAMUSCULAR | Qty: 1 | Status: AC

## 2021-09-28 NOTE — Patient Instructions (Signed)
[facility] ONCOLOGY  Discharge Instructions: Thank you for choosing [facility] to provide your oncology and hematology care.   If you have a lab appointment with the [facility], please go directly to the [facility] and check in at the registration area.   Wear comfortable clothing and clothing appropriate for easy access to any Portacath or PICC line.   We strive to give you quality time with your provider. You may need to reschedule your appointment if you arrive late (15 or more minutes).  Arriving late affects you and other patients whose appointments are after yours.  Also, if you miss three or more appointments without notifying the office, you may be dismissed from the clinic at the provider's discretion.      For prescription refill requests, have your pharmacy contact our office and allow 72 hours for refills to be completed.    Today you received the following chemotherapy and/or immunotherapy agents: Etoposide, Cisplatin   To help prevent nausea and vomiting after your treatment, we encourage you to take your nausea medication as directed.  BELOW ARE SYMPTOMS THAT SHOULD BE REPORTED IMMEDIATELY: *FEVER GREATER THAN 100.4 F (38 C) OR HIGHER *CHILLS OR SWEATING *NAUSEA AND VOMITING THAT IS NOT CONTROLLED WITH YOUR NAUSEA MEDICATION *UNUSUAL SHORTNESS OF BREATH *UNUSUAL BRUISING OR BLEEDING *URINARY PROBLEMS (pain or burning when urinating, or frequent urination) *BOWEL PROBLEMS (unusual diarrhea, constipation, pain near the anus) TENDERNESS IN MOUTH AND THROAT WITH OR WITHOUT PRESENCE OF ULCERS (sore throat, sores in mouth, or a toothache) UNUSUAL RASH, SWELLING OR PAIN  UNUSUAL VAGINAL DISCHARGE OR ITCHING   Items with * indicate a potential emergency and should be followed up as soon as possible or go to the Emergency Department if any problems should occur.  Please show the CHEMOTHERAPY ALERT CARD or IMMUNOTHERAPY ALERT CARD at  check-in to the Emergency Department and triage nurse.  Should you have questions after your visit or need to cancel or reschedule your appointment, please contact [facility]  Dept: [phone]  and follow the prompts.  Office hours are 8:00 a.m. to 4:30 p.m. Monday - Friday. Please note that voicemails left after 4:00 p.m. may not be returned until the following business day.  We are closed weekends and major holidays. You have access to a nurse at all times for urgent questions. Please call the main number to the clinic Dept: [phone] and follow the prompts.   For any non-urgent questions, you may also contact your provider using MyChart. We now offer e-Visits for anyone 51 and older to request care online for non-urgent symptoms. For details visit [email or web address].   Also download the MyChart app! Go to the app store, search "MyChart", open the app, select [facility], and log in with your MyChart username and password.  Due to Covid, a mask is required upon entering the hospital/clinic. If you do not have a mask, one will be given to you upon arrival. For doctor visits, patients may have 1 support Augusto Deckman aged 53 or older with them. For treatment visits, patients cannot have anyone with them due to current Covid guidelines and our immunocompromised population.

## 2021-09-28 NOTE — Progress Notes (Signed)
Per Dr. Lorenso Courier, okay to treat with ANC 1.0 K/uL.

## 2021-09-29 ENCOUNTER — Inpatient Hospital Stay: Payer: Medicaid Other

## 2021-09-29 ENCOUNTER — Ambulatory Visit
Admission: RE | Admit: 2021-09-29 | Discharge: 2021-09-29 | Disposition: A | Discharge status: Home / Self Care | Payer: Medicaid Other | Source: Ambulatory Visit | Attending: Radiation Oncology | Admitting: Radiation Oncology

## 2021-09-29 ENCOUNTER — Other Ambulatory Visit: Payer: Self-pay

## 2021-09-29 ENCOUNTER — Inpatient Hospital Stay: Payer: Medicaid Other | Admitting: Nutrition

## 2021-09-29 VITALS — BP 97/63 | HR 83 | Temp 98.1°F | Resp 18

## 2021-09-29 DIAGNOSIS — C3432 Malignant neoplasm of lower lobe, left bronchus or lung: Secondary | ICD-10-CM

## 2021-09-29 DIAGNOSIS — Z5111 Encounter for antineoplastic chemotherapy: Secondary | ICD-10-CM | POA: Diagnosis not present

## 2021-09-29 LAB — RAD ONC ARIA SESSION SUMMARY
Course Elapsed Days: 44
Plan Fractions Treated to Date: 32
Plan Prescribed Dose Per Fraction: 2 Gy
Plan Total Fractions Prescribed: 33
Plan Total Prescribed Dose: 66 Gy
Reference Point Dosage Given to Date: 64 Gy
Reference Point Session Dosage Given: 2 Gy
Session Number: 32

## 2021-09-29 MED ORDER — SODIUM CHLORIDE 0.9% FLUSH
10.0000 mL | INTRAVENOUS | Status: DC | PRN
  Administered 2021-09-29: 10 mL

## 2021-09-29 MED ORDER — HEPARIN SOD (PORK) LOCK FLUSH 100 UNIT/ML IV SOLN
500.0000 [IU] | Freq: Once | INTRAVENOUS | Status: AC | PRN
  Administered 2021-09-29: 500 [IU]

## 2021-09-29 MED ORDER — SODIUM CHLORIDE 0.9 % IV SOLN
100.0000 mg/m2 | Freq: Once | INTRAVENOUS | Status: AC
  Administered 2021-09-29: 160 mg via INTRAVENOUS
  Filled 2021-09-29: qty 8

## 2021-09-29 MED ORDER — SODIUM CHLORIDE 0.9 % IV SOLN: Freq: Once | INTRAVENOUS | Status: AC

## 2021-09-29 MED ORDER — SODIUM CHLORIDE 0.9 % IV SOLN
10.0000 mg | Freq: Once | INTRAVENOUS | Status: AC
  Administered 2021-09-29: 10 mg via INTRAVENOUS
  Filled 2021-09-29: qty 10

## 2021-09-29 MED FILL — Dexamethasone Sodium Phosphate Inj 100 MG/10ML: INTRAMUSCULAR | Qty: 1 | Status: AC

## 2021-09-29 NOTE — Patient Instructions (Signed)
[facility]  Discharge Instructions: Thank you for choosing [facility] to provide your oncology and hematology care.   If you have a lab appointment with the [facility], please go directly to the [facility] and check in at the registration area.   Wear comfortable clothing and clothing appropriate for easy access to any Portacath or PICC line.   We strive to give you quality time with your provider. You may need to reschedule your appointment if you arrive late (15 or more minutes).  Arriving late affects you and other patients whose appointments are after yours.  Also, if you miss three or more appointments without notifying the office, you may be dismissed from the clinic at the provider's discretion.      For prescription refill requests, have your pharmacy contact our office and allow 72 hours for refills to be completed.    Today you received the following chemotherapy and/or immunotherapy agents: Etoposide.       To help prevent nausea and vomiting after your treatment, we encourage you to take your nausea medication as directed.  BELOW ARE SYMPTOMS THAT SHOULD BE REPORTED IMMEDIATELY: *FEVER GREATER THAN 100.4 F (38 C) OR HIGHER *CHILLS OR SWEATING *NAUSEA AND VOMITING THAT IS NOT CONTROLLED WITH YOUR NAUSEA MEDICATION *UNUSUAL SHORTNESS OF BREATH *UNUSUAL BRUISING OR BLEEDING *URINARY PROBLEMS (pain or burning when urinating, or frequent urination) *BOWEL PROBLEMS (unusual diarrhea, constipation, pain near the anus) TENDERNESS IN MOUTH AND THROAT WITH OR WITHOUT PRESENCE OF ULCERS (sore throat, sores in mouth, or a toothache) UNUSUAL RASH, SWELLING OR PAIN  UNUSUAL VAGINAL DISCHARGE OR ITCHING   Items with * indicate a potential emergency and should be followed up as soon as possible or go to the Emergency Department if any problems should occur.  Please show the CHEMOTHERAPY ALERT CARD or IMMUNOTHERAPY ALERT CARD at check-in  to the Emergency Department and triage nurse.  Should you have questions after your visit or need to cancel or reschedule your appointment, please contact [facility]  Dept: [phone]  and follow the prompts.  Office hours are 8:00 a.m. to 4:30 p.m. Monday - Friday. Please note that voicemails left after 4:00 p.m. may not be returned until the following business day.  We are closed weekends and major holidays. You have access to a nurse at all times for urgent questions. Please call the main number to the clinic Dept: [phone] and follow the prompts.   For any non-urgent questions, you may also contact your provider using MyChart. We now offer e-Visits for anyone 4 and older to request care online for non-urgent symptoms. For details visit [email or web address].   Also download the MyChart app! Go to the app store, search "MyChart", open the app, select [facility], and log in with your MyChart username and password.  Masks are optional in the cancer centers. If you would like for your care team to wear a mask while they are taking care of you, please let them know. For doctor visits, patients may have with them one support person who is at least 53 years old. At this time, visitors are not allowed in the infusion area.

## 2021-09-29 NOTE — Progress Notes (Signed)
Nutrition follow-up completed with patient during infusion for small cell lung cancer.  Patient weight stable at 127 pounds 9.6 ounces July 12.   Patient weighed 128 pounds 1.6 ounces June 21.  Labs reviewed.  Noted sodium 125.  Patient reports things are pretty much the same since last RD visit. He continues to have no taste for food.   He has a sore on his tongue which is bothersome.   He continues to have nausea.   Reports trying to drink at least 1 Ensure daily but sometimes he forgets. Constipation continues.  Nutrition diagnosis: Inadequate oral intake ongoing.  Intervention: Reviewed strategies for improving taste alterations. Encouraged patient to use a baking soda and salt water rinses to improve taste and to help mouth sore. Reviewed strategies for improving nausea. Encouraged increased fluids to help with constipation.  Continue bowel regimen per MD. Increase Ensure plus or equivalent twice daily.  Provided complementary case of Ensure Plus high-protein today. Offered food bag however patient declined.  Monitoring, evaluation, goals: Patient will tolerate increased calories and protein to minimize weight loss.  Next visit: Wednesday, August 2 during infusion with Vinnie Level.  **Disclaimer: This note was dictated with voice recognition software. Similar sounding words can inadvertently be transcribed and this note may contain transcription errors which may not have been corrected upon publication of note.**

## 2021-09-30 ENCOUNTER — Ambulatory Visit
Admission: RE | Admit: 2021-09-30 | Discharge: 2021-09-30 | Disposition: A | Discharge status: Home / Self Care | Payer: Medicaid Other | Source: Ambulatory Visit | Attending: Radiation Oncology | Admitting: Radiation Oncology

## 2021-09-30 ENCOUNTER — Other Ambulatory Visit: Payer: Self-pay

## 2021-09-30 ENCOUNTER — Other Ambulatory Visit: Payer: Self-pay | Admitting: *Deleted

## 2021-09-30 ENCOUNTER — Encounter: Payer: Self-pay | Admitting: Hematology and Oncology

## 2021-09-30 ENCOUNTER — Ambulatory Visit: Payer: Medicaid Other

## 2021-09-30 ENCOUNTER — Encounter: Payer: Self-pay | Admitting: Radiation Oncology

## 2021-09-30 ENCOUNTER — Inpatient Hospital Stay: Payer: Medicaid Other

## 2021-09-30 VITALS — BP 111/80 | HR 81 | Temp 97.9°F | Resp 17

## 2021-09-30 DIAGNOSIS — C3432 Malignant neoplasm of lower lobe, left bronchus or lung: Secondary | ICD-10-CM | POA: Diagnosis not present

## 2021-09-30 DIAGNOSIS — Z5111 Encounter for antineoplastic chemotherapy: Secondary | ICD-10-CM | POA: Diagnosis not present

## 2021-09-30 LAB — RAD ONC ARIA SESSION SUMMARY
Course Elapsed Days: 45
Plan Fractions Treated to Date: 33
Plan Prescribed Dose Per Fraction: 2 Gy
Plan Total Fractions Prescribed: 33
Plan Total Prescribed Dose: 66 Gy
Reference Point Dosage Given to Date: 66 Gy
Reference Point Session Dosage Given: 2 Gy
Session Number: 33

## 2021-09-30 MED ORDER — SODIUM CHLORIDE 0.9 % IV SOLN
10.0000 mg | Freq: Once | INTRAVENOUS | Status: AC
  Administered 2021-09-30: 10 mg via INTRAVENOUS
  Filled 2021-09-30: qty 10

## 2021-09-30 MED ORDER — SODIUM CHLORIDE 0.9 % IV SOLN
100.0000 mg/m2 | Freq: Once | INTRAVENOUS | Status: AC
  Administered 2021-09-30: 160 mg via INTRAVENOUS
  Filled 2021-09-30: qty 8

## 2021-09-30 MED ORDER — SENNOSIDES-DOCUSATE SODIUM 8.6-50 MG PO TABS: 1.0000 | ORAL_TABLET | Freq: Every day | ORAL | Status: DC

## 2021-09-30 MED ORDER — SODIUM CHLORIDE 0.9% FLUSH
10.0000 mL | INTRAVENOUS | Status: DC | PRN
  Administered 2021-09-30: 10 mL

## 2021-09-30 MED ORDER — HEPARIN SOD (PORK) LOCK FLUSH 100 UNIT/ML IV SOLN
500.0000 [IU] | Freq: Once | INTRAVENOUS | Status: AC | PRN
  Administered 2021-09-30: 500 [IU]

## 2021-09-30 MED ORDER — SODIUM CHLORIDE 0.9 % IV SOLN: Freq: Once | INTRAVENOUS | Status: AC

## 2021-09-30 NOTE — Progress Notes (Signed)
Patient stated that he was concerned that he has not had a BM in 4 days. Patient advised to drink half a bottle of Mag citrate and wait 4 hours. If no BM then drink rest of the bottle and wait 4-6 hours. If no BM then go to the ED. Drucie Ip RN aware and will update Dr. Lorenso Courier. Patient verbalized understanding.

## 2021-09-30 NOTE — Patient Instructions (Addendum)
[facility]  Discharge Instructions: Thank you for choosing [facility] to provide your oncology and hematology care.   If you have a lab appointment with the [facility], please go directly to the [facility] and check in at the registration area.   Wear comfortable clothing and clothing appropriate for easy access to any Portacath or PICC line.   We strive to give you quality time with your provider. You may need to reschedule your appointment if you arrive late (15 or more minutes).  Arriving late affects you and other patients whose appointments are after yours.  Also, if you miss three or more appointments without notifying the office, you may be dismissed from the clinic at the provider's discretion.      For prescription refill requests, have your pharmacy contact our office and allow 72 hours for refills to be completed.    Today you received the following chemotherapy and/or immunotherapy agents : etoposide      To help prevent nausea and vomiting after your treatment, we encourage you to take your nausea medication as directed.  BELOW ARE SYMPTOMS THAT SHOULD BE REPORTED IMMEDIATELY: *FEVER GREATER THAN 100.4 F (38 C) OR HIGHER *CHILLS OR SWEATING *NAUSEA AND VOMITING THAT IS NOT CONTROLLED WITH YOUR NAUSEA MEDICATION *UNUSUAL SHORTNESS OF BREATH *UNUSUAL BRUISING OR BLEEDING *URINARY PROBLEMS (pain or burning when urinating, or frequent urination) *BOWEL PROBLEMS (unusual diarrhea, constipation, pain near the anus) TENDERNESS IN MOUTH AND THROAT WITH OR WITHOUT PRESENCE OF ULCERS (sore throat, sores in mouth, or a toothache) UNUSUAL RASH, SWELLING OR PAIN  UNUSUAL VAGINAL DISCHARGE OR ITCHING   Items with * indicate a potential emergency and should be followed up as soon as possible or go to the Emergency Department if any problems should occur.  Please show the CHEMOTHERAPY ALERT CARD or IMMUNOTHERAPY ALERT CARD at check-in to  the Emergency Department and triage nurse.  Should you have questions after your visit or need to cancel or reschedule your appointment, please contact [facility]  Dept: [phone]  and follow the prompts.  Office hours are 8:00 a.m. to 4:30 p.m. Monday - Friday. Please note that voicemails left after 4:00 p.m. may not be returned until the following business day.  We are closed weekends and major holidays. You have access to a nurse at all times for urgent questions. Please call the main number to the clinic Dept: [phone] and follow the prompts.   For any non-urgent questions, you may also contact your provider using MyChart. We now offer e-Visits for anyone 81 and older to request care online for non-urgent symptoms. For details visit [email or web address].   Also download the MyChart app! Go to the app store, search "MyChart", open the app, select [facility], and log in with your MyChart username and password.  Masks are optional in the cancer centers. If you would like for your care team to wear a mask while they are taking care of you, please let them know. For doctor visits, patients may have with them one support person who is at least 53 years old. At this time, visitors are not allowed in the infusion area. Constipation, Adult Constipation is when a person has fewer than three bowel movements in a week, has difficulty having a bowel movement, or has stools (feces) that are dry, hard, or larger than normal. Constipation may be caused by an underlying condition. It may become worse with age if a  person takes certain medicines and does not take in enough fluids. Follow these instructions at home: Eating and drinking  Eat foods that have a lot of fiber, such as beans, whole grains, and fresh fruits and vegetables. Limit foods that are low in fiber and high in fat and processed sugars, such as fried or sweet foods. These include french fries, hamburgers,  cookies, candies, and soda. Drink enough fluid to keep your urine pale yellow. General instructions Exercise regularly or as told by your health care provider. Try to do 150 minutes of moderate exercise each week. Use the bathroom when you have the urge to go. Do not hold it in. Take over-the-counter and prescription medicines only as told by your health care provider. This includes any fiber supplements. During bowel movements: Practice deep breathing while relaxing the lower abdomen. Practice pelvic floor relaxation. Watch your condition for any changes. Let your health care provider know about them. Keep all follow-up visits as told by your health care provider. This is important. Contact a health care provider if: You have pain that gets worse. You have a fever. You do not have a bowel movement after 4 days. You vomit. You are not hungry or you lose weight. You are bleeding from the opening between the buttocks (anus). You have thin, pencil-like stools. Get help right away if: You have a fever and your symptoms suddenly get worse. You leak stool or have blood in your stool. Your abdomen is bloated. You have severe pain in your abdomen. You feel dizzy or you faint. Summary Constipation is when a person has fewer than three bowel movements in a week, has difficulty having a bowel movement, or has stools (feces) that are dry, hard, or larger than normal. Eat foods that have a lot of fiber, such as beans, whole grains, and fresh fruits and vegetables. Drink enough fluid to keep your urine pale yellow. Take over-the-counter and prescription medicines only as told by your health care provider. This includes any fiber supplements. This information is not intended to replace advice given to you by your health care provider. Make sure you discuss any questions you have with your health care provider. Document Revised: 01/22/2019 Document Reviewed: 01/22/2019 Elsevier Patient Education   [place].

## 2021-09-30 NOTE — Progress Notes (Signed)
Patient called after receiving my contact information.  Introduced myself as Arboriculturist and to offer available resources. Discussed the one-time $1000 [employer] account executive and qualifications to assist with personal expenses while going through treatment. Advised what is needed to apply. He will bring letter of support on 8/2 at his next visit and be given grant paperwork to complete. Advised him to call me at his earliest convenience to discuss grant details.  He has my contact information for any additional financial questions or concerns and will be given my card at 8/2 visit as well.

## 2021-10-03 ENCOUNTER — Encounter: Payer: Self-pay | Admitting: Hematology and Oncology

## 2021-10-03 ENCOUNTER — Ambulatory Visit: Payer: Medicaid Other

## 2021-10-03 NOTE — Progress Notes (Signed)
Patient called asking about grant process again. Advised him to bring his documentation on 8/2 at next visit and he will be given grant paperwork in return to sign.  He has my card for any additional financial questions or concerns.

## 2021-10-05 ENCOUNTER — Other Ambulatory Visit: Payer: Self-pay | Admitting: Hematology and Oncology

## 2021-10-10 ENCOUNTER — Other Ambulatory Visit: Payer: Self-pay

## 2021-10-13 ENCOUNTER — Telehealth: Payer: Self-pay | Admitting: *Deleted

## 2021-10-13 NOTE — Telephone Encounter (Signed)
Received call from patient [phone]~ telephone.   Surgical Date: 06/03/2021 Procedure: Sigmoid colectomy with colostomy   Allergies: NKDA Pharmacy: [facility]  Patient reports that previous PCP retired and he has not been accepted as a new patient for primary care.   Reports that he requires refill on Amlodipine. Medication prescribed during hospitalization by hospitalist.   Ok to refill?

## 2021-10-14 MED ORDER — AMLODIPINE BESYLATE 10 MG PO TABS: 10.0000 mg | ORAL_TABLET | Freq: Every day | ORAL | 1 refills | Status: DC

## 2021-10-14 NOTE — Telephone Encounter (Signed)
Dr. Arnoldo Morale agreeable to refilling medication.   Prescription sent to pharmacy.

## 2021-10-19 ENCOUNTER — Other Ambulatory Visit: Payer: Self-pay

## 2021-10-19 ENCOUNTER — Inpatient Hospital Stay: Payer: Medicaid Other | Admitting: Dietician

## 2021-10-19 ENCOUNTER — Inpatient Hospital Stay (HOSPITAL_BASED_OUTPATIENT_CLINIC_OR_DEPARTMENT_OTHER): Payer: Medicaid Other | Admitting: Hematology and Oncology

## 2021-10-19 ENCOUNTER — Inpatient Hospital Stay: Payer: Medicaid Other

## 2021-10-19 ENCOUNTER — Inpatient Hospital Stay: Payer: Medicaid Other | Attending: Hematology and Oncology

## 2021-10-19 ENCOUNTER — Other Ambulatory Visit: Payer: Self-pay | Admitting: Hematology and Oncology

## 2021-10-19 ENCOUNTER — Encounter: Payer: Self-pay | Admitting: Hematology and Oncology

## 2021-10-19 VITALS — BP 113/84 | HR 94 | Temp 98.3°F | Resp 17 | Ht 66.0 in | Wt 128.7 lb

## 2021-10-19 DIAGNOSIS — R110 Nausea: Secondary | ICD-10-CM | POA: Diagnosis not present

## 2021-10-19 DIAGNOSIS — Z452 Encounter for adjustment and management of vascular access device: Secondary | ICD-10-CM | POA: Diagnosis not present

## 2021-10-19 DIAGNOSIS — C3490 Malignant neoplasm of unspecified part of unspecified bronchus or lung: Secondary | ICD-10-CM

## 2021-10-19 DIAGNOSIS — Z5111 Encounter for antineoplastic chemotherapy: Secondary | ICD-10-CM | POA: Diagnosis present

## 2021-10-19 DIAGNOSIS — Z95828 Presence of other vascular implants and grafts: Secondary | ICD-10-CM

## 2021-10-19 DIAGNOSIS — C3432 Malignant neoplasm of lower lobe, left bronchus or lung: Secondary | ICD-10-CM

## 2021-10-19 DIAGNOSIS — K5900 Constipation, unspecified: Secondary | ICD-10-CM | POA: Diagnosis not present

## 2021-10-19 LAB — CBC WITH DIFFERENTIAL (CANCER CENTER ONLY)
Abs Immature Granulocytes: 0.03 10*3/uL (ref 0.00–0.07)
Basophils Absolute: 0 10*3/uL (ref 0.0–0.1)
Basophils Relative: 1 %
Eosinophils Absolute: 0.1 10*3/uL (ref 0.0–0.5)
Eosinophils Relative: 4 %
HCT: 35.5 % — ABNORMAL LOW (ref 39.0–52.0)
Hemoglobin: 13.1 g/dL (ref 13.0–17.0)
Immature Granulocytes: 1 %
Lymphocytes Relative: 23 %
Lymphs Abs: 0.6 10*3/uL — ABNORMAL LOW (ref 0.7–4.0)
MCH: 34.5 pg — ABNORMAL HIGH (ref 26.0–34.0)
MCHC: 36.9 g/dL — ABNORMAL HIGH (ref 30.0–36.0)
MCV: 93.4 fL (ref 80.0–100.0)
Monocytes Absolute: 0.5 10*3/uL (ref 0.1–1.0)
Monocytes Relative: 21 %
Neutro Abs: 1.3 10*3/uL — ABNORMAL LOW (ref 1.7–7.7)
Neutrophils Relative %: 50 %
Platelet Count: 207 10*3/uL (ref 150–400)
RBC: 3.8 MIL/uL — ABNORMAL LOW (ref 4.22–5.81)
RDW: 16.7 % — ABNORMAL HIGH (ref 11.5–15.5)
Smear Review: NORMAL
WBC Count: 2.6 10*3/uL — ABNORMAL LOW (ref 4.0–10.5)
nRBC: 0 % (ref 0.0–0.2)

## 2021-10-19 LAB — CMP (CANCER CENTER ONLY)
ALT: 10 U/L (ref 0–44)
AST: 12 U/L — ABNORMAL LOW (ref 15–41)
Albumin: 4.3 g/dL (ref 3.5–5.0)
Alkaline Phosphatase: 49 U/L (ref 38–126)
Anion gap: 6 (ref 5–15)
BUN: 6 mg/dL (ref 6–20)
CO2: 27 mmol/L (ref 22–32)
Calcium: 9 mg/dL (ref 8.9–10.3)
Chloride: 99 mmol/L (ref 98–111)
Creatinine: 0.67 mg/dL (ref 0.61–1.24)
GFR, Estimated: >60 mL/min (ref >60)
Glucose, Bld: 128 mg/dL — ABNORMAL HIGH (ref 70–99)
Potassium: 4 mmol/L (ref 3.5–5.1)
Sodium: 132 mmol/L — ABNORMAL LOW (ref 135–145)
Total Bilirubin: 0.3 mg/dL (ref 0.3–1.2)
Total Protein: 6.6 g/dL (ref 6.5–8.1)

## 2021-10-19 LAB — TSH: TSH: 3.063 u[IU]/mL (ref 0.350–4.500)

## 2021-10-19 MED ORDER — HEPARIN SOD (PORK) LOCK FLUSH 100 UNIT/ML IV SOLN
500.0000 [IU] | Freq: Once | INTRAVENOUS | Status: AC | PRN
  Administered 2021-10-19: 500 [IU]

## 2021-10-19 MED ORDER — SODIUM CHLORIDE 0.9 % IV SOLN
10.0000 mg | Freq: Once | INTRAVENOUS | Status: AC
  Administered 2021-10-19: 10 mg via INTRAVENOUS
  Filled 2021-10-19: qty 10

## 2021-10-19 MED ORDER — SODIUM CHLORIDE 0.9 % IV SOLN
150.0000 mg | Freq: Once | INTRAVENOUS | Status: AC
  Administered 2021-10-19: 150 mg via INTRAVENOUS
  Filled 2021-10-19: qty 150

## 2021-10-19 MED ORDER — SODIUM CHLORIDE 0.9 % IV SOLN: Freq: Once | INTRAVENOUS | Status: AC

## 2021-10-19 MED ORDER — MAGNESIUM SULFATE 2 GM/50ML IV SOLN
2.0000 g | Freq: Once | INTRAVENOUS | Status: AC
  Administered 2021-10-19: 2 g via INTRAVENOUS
  Filled 2021-10-19: qty 50

## 2021-10-19 MED ORDER — SODIUM CHLORIDE 0.9 % IV SOLN
100.0000 mg/m2 | Freq: Once | INTRAVENOUS | Status: AC
  Administered 2021-10-19: 160 mg via INTRAVENOUS
  Filled 2021-10-19: qty 8

## 2021-10-19 MED ORDER — POTASSIUM CHLORIDE IN NACL 20-0.9 MEQ/L-% IV SOLN
Freq: Once | INTRAVENOUS | Status: AC
  Filled 2021-10-19: qty 1000

## 2021-10-19 MED ORDER — SODIUM CHLORIDE 0.9% FLUSH
10.0000 mL | INTRAVENOUS | Status: DC | PRN
  Administered 2021-10-19: 10 mL

## 2021-10-19 MED ORDER — SODIUM CHLORIDE 0.9 % IV SOLN
80.0000 mg/m2 | Freq: Once | INTRAVENOUS | Status: AC
  Administered 2021-10-19: 130 mg via INTRAVENOUS
  Filled 2021-10-19: qty 130

## 2021-10-19 MED ORDER — SODIUM CHLORIDE 0.9% FLUSH: 10.0000 mL | Freq: Once | INTRAVENOUS | Status: DC

## 2021-10-19 MED ORDER — PALONOSETRON HCL INJECTION 0.25 MG/5ML
0.2500 mg | Freq: Once | INTRAVENOUS | Status: AC
  Administered 2021-10-19: 0.25 mg via INTRAVENOUS
  Filled 2021-10-19: qty 5

## 2021-10-19 NOTE — Progress Notes (Signed)
Patient provided documentation at registration for [employer].  Patient approved for one-time $1000 [employer] grant to assist with personal expenses while going through treatment. He was given a copy of approval letter and expense sheet along with the Outpatient pharmacy information.  He also has my card to contact at his earliest convenience to go over expenses in detail and for any additional financial questions or concerns.

## 2021-10-19 NOTE — Progress Notes (Signed)
Per Dr. Lorenso Courier, okay to treat with ANC 1.3 Okay to run post hydration with cisplatin

## 2021-10-19 NOTE — Progress Notes (Signed)
Nutrition Follow-up:  Patient with small cell lung cancer stage IIIb. He is receiving chemoradiation with weekly cisplatin/etoposide (started 5/31).   Met with patient in infusion. He reports appetite has improved. Patient recalls 3 meals plus 1-2 Ensure. He reports constipation has resolved with dietary changes and stool softener. Recalls formed stool in bag. He is emptying daily. Patient is drinking ~3 bottles of water. Patient has intermittent nausea. He takes antiemetics "if it gets real bad"   Medications: reviewed   Labs: Na 132, glucose 128  Anthropometrics: Weight 128 lb 11.2 oz today stable   7/11 - 127 lb 9.6 oz  6/21 - 128 lb 1.6 oz  5/31 - 129 lb 6.4 oz   NUTRITION DIAGNOSIS: Inadequate oral intake improved   INTERVENTION:  Encouraged high calorie high protein foods to promote weight gain Continue daily stool softener Continue 2 Ensure Plus/equivalent  One complimentary case Ensure Plus HP provided One food bag from [employer] provided    MONITORING, EVALUATION, GOAL: weight trends, intake    NEXT VISIT: To be scheduled as needed

## 2021-10-19 NOTE — Patient Instructions (Signed)
[facility]  Discharge Instructions: Thank you for choosing [facility] to provide your oncology and hematology care.   If you have a lab appointment with the [facility], please go directly to the [facility] and check in at the registration area.   Wear comfortable clothing and clothing appropriate for easy access to any Portacath or PICC line.   We strive to give you quality time with your provider. You may need to reschedule your appointment if you arrive late (15 or more minutes).  Arriving late affects you and other patients whose appointments are after yours.  Also, if you miss three or more appointments without notifying the office, you may be dismissed from the clinic at the provider's discretion.      For prescription refill requests, have your pharmacy contact our office and allow 72 hours for refills to be completed.    Today you received the following chemotherapy and/or immunotherapy agents: Etoposide, Cisplatin   To help prevent nausea and vomiting after your treatment, we encourage you to take your nausea medication as directed.  BELOW ARE SYMPTOMS THAT SHOULD BE REPORTED IMMEDIATELY: *FEVER GREATER THAN 100.4 F (38 C) OR HIGHER *CHILLS OR SWEATING *NAUSEA AND VOMITING THAT IS NOT CONTROLLED WITH YOUR NAUSEA MEDICATION *UNUSUAL SHORTNESS OF BREATH *UNUSUAL BRUISING OR BLEEDING *URINARY PROBLEMS (pain or burning when urinating, or frequent urination) *BOWEL PROBLEMS (unusual diarrhea, constipation, pain near the anus) TENDERNESS IN MOUTH AND THROAT WITH OR WITHOUT PRESENCE OF ULCERS (sore throat, sores in mouth, or a toothache) UNUSUAL RASH, SWELLING OR PAIN  UNUSUAL VAGINAL DISCHARGE OR ITCHING   Items with * indicate a potential emergency and should be followed up as soon as possible or go to the Emergency Department if any problems should occur.  Please show the CHEMOTHERAPY ALERT CARD or IMMUNOTHERAPY ALERT CARD at  check-in to the Emergency Department and triage nurse.  Should you have questions after your visit or need to cancel or reschedule your appointment, please contact [facility]  Dept: [phone]  and follow the prompts.  Office hours are 8:00 a.m. to 4:30 p.m. Monday - Friday. Please note that voicemails left after 4:00 p.m. may not be returned until the following business day.  We are closed weekends and major holidays. You have access to a nurse at all times for urgent questions. Please call the main number to the clinic Dept: [phone] and follow the prompts.   For any non-urgent questions, you may also contact your provider using MyChart. We now offer e-Visits for anyone 31 and older to request care online for non-urgent symptoms. For details visit [email or web address].   Also download the MyChart app! Go to the app store, search "MyChart", open the app, select [facility], and log in with your MyChart username and password.  Due to Covid, a mask is required upon entering the hospital/clinic. If you do not have a mask, one will be given to you upon arrival. For doctor visits, patients may have 1 support French Kendra aged 53 or older with them. For treatment visits, patients cannot have anyone with them due to current Covid guidelines and our immunocompromised population.
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
## 2021-10-20 ENCOUNTER — Inpatient Hospital Stay: Payer: Medicaid Other

## 2021-10-20 ENCOUNTER — Other Ambulatory Visit: Payer: Self-pay | Admitting: *Deleted

## 2021-10-20 VITALS — BP 127/86 | HR 98 | Temp 98.0°F | Resp 18

## 2021-10-20 DIAGNOSIS — C3432 Malignant neoplasm of lower lobe, left bronchus or lung: Secondary | ICD-10-CM

## 2021-10-20 DIAGNOSIS — Z5111 Encounter for antineoplastic chemotherapy: Secondary | ICD-10-CM | POA: Diagnosis not present

## 2021-10-20 LAB — T4: T4, Total: 8.1 ug/dL (ref 4.5–12.0)

## 2021-10-20 MED ORDER — HEPARIN SOD (PORK) LOCK FLUSH 100 UNIT/ML IV SOLN
500.0000 [IU] | Freq: Once | INTRAVENOUS | Status: AC | PRN
  Administered 2021-10-20: 500 [IU]

## 2021-10-20 MED ORDER — SODIUM CHLORIDE 0.9 % IV SOLN
10.0000 mg | Freq: Once | INTRAVENOUS | Status: AC
  Administered 2021-10-20: 10 mg via INTRAVENOUS
  Filled 2021-10-20: qty 10

## 2021-10-20 MED ORDER — SODIUM CHLORIDE 0.9 % IV SOLN: Freq: Once | INTRAVENOUS | Status: AC

## 2021-10-20 MED ORDER — TRAZODONE HCL 50 MG PO TABS: 50.0000 mg | ORAL_TABLET | Freq: Every day | ORAL | 4 refills | Status: DC

## 2021-10-20 MED ORDER — SODIUM CHLORIDE 0.9% FLUSH
10.0000 mL | INTRAVENOUS | Status: DC | PRN
  Administered 2021-10-20: 10 mL

## 2021-10-20 MED ORDER — SODIUM CHLORIDE 0.9 % IV SOLN
100.0000 mg/m2 | Freq: Once | INTRAVENOUS | Status: AC
  Administered 2021-10-20: 160 mg via INTRAVENOUS
  Filled 2021-10-20: qty 8

## 2021-10-20 MED ORDER — OMEPRAZOLE 40 MG PO CPDR: 40.0000 mg | DELAYED_RELEASE_CAPSULE | Freq: Every day | ORAL | 2 refills | Status: DC

## 2021-10-20 MED ORDER — LABETALOL HCL 200 MG PO TABS: 200.0000 mg | ORAL_TABLET | Freq: Two times a day (BID) | ORAL | 3 refills | Status: DC

## 2021-10-20 MED FILL — Dexamethasone Sodium Phosphate Inj 100 MG/10ML: INTRAMUSCULAR | Qty: 1 | Status: AC

## 2021-10-20 NOTE — Patient Instructions (Signed)
[facility]  Discharge Instructions: Thank you for choosing [facility] to provide your oncology and hematology care.   If you have a lab appointment with the [facility], please go directly to the [facility] and check in at the registration area.   Wear comfortable clothing and clothing appropriate for easy access to any Portacath or PICC line.   We strive to give you quality time with your provider. You may need to reschedule your appointment if you arrive late (15 or more minutes).  Arriving late affects you and other patients whose appointments are after yours.  Also, if you miss three or more appointments without notifying the office, you may be dismissed from the clinic at the provider's discretion.      For prescription refill requests, have your pharmacy contact our office and allow 72 hours for refills to be completed.    Today you received the following chemotherapy and/or immunotherapy agents: Etoposide.   To help prevent nausea and vomiting after your treatment, we encourage you to take your nausea medication as directed.  BELOW ARE SYMPTOMS THAT SHOULD BE REPORTED IMMEDIATELY: *FEVER GREATER THAN 100.4 F (38 C) OR HIGHER *CHILLS OR SWEATING *NAUSEA AND VOMITING THAT IS NOT CONTROLLED WITH YOUR NAUSEA MEDICATION *UNUSUAL SHORTNESS OF BREATH *UNUSUAL BRUISING OR BLEEDING *URINARY PROBLEMS (pain or burning when urinating, or frequent urination) *BOWEL PROBLEMS (unusual diarrhea, constipation, pain near the anus) TENDERNESS IN MOUTH AND THROAT WITH OR WITHOUT PRESENCE OF ULCERS (sore throat, sores in mouth, or a toothache) UNUSUAL RASH, SWELLING OR PAIN  UNUSUAL VAGINAL DISCHARGE OR ITCHING   Items with * indicate a potential emergency and should be followed up as soon as possible or go to the Emergency Department if any problems should occur.  Please show the CHEMOTHERAPY ALERT CARD or IMMUNOTHERAPY ALERT CARD at check-in to  the Emergency Department and triage nurse.  Should you have questions after your visit or need to cancel or reschedule your appointment, please contact [facility]  Dept: [phone]  and follow the prompts.  Office hours are 8:00 a.m. to 4:30 p.m. Monday - Friday. Please note that voicemails left after 4:00 p.m. may not be returned until the following business day.  We are closed weekends and major holidays. You have access to a nurse at all times for urgent questions. Please call the main number to the clinic Dept: [phone] and follow the prompts.   For any non-urgent questions, you may also contact your provider using MyChart. We now offer e-Visits for anyone 47 and older to request care online for non-urgent symptoms. For details visit [email or web address].   Also download the MyChart app! Go to the app store, search "MyChart", open the app, select [facility], and log in with your MyChart username and password.  Masks are optional in the cancer centers. If you would like for your care team to wear a mask while they are taking care of you, please let them know. You may have one support person who is at least 53 years old accompany you for your appointments.  Etoposide Injection What is this medication? ETOPOSIDE (e toe POE side) treats some types of cancer. It works by slowing down the growth of cancer cells. This medicine may be used for other purposes; ask your health care provider or pharmacist if you have questions. COMMON BRAND NAME(S): Etopophos, Toposar, VePesid What should I tell my care team before I take this medication? They  need to know if you have any of these conditions: Infection Kidney disease Liver disease Low blood counts, such as low white cell, platelet, red cell counts An unusual or allergic reaction to etoposide, other medications, foods, dyes, or preservatives If you or your partner are pregnant or trying to get  pregnant Breastfeeding How should I use this medication? This medication is injected into a vein. It is given by your care team in a hospital or clinic setting. Talk to your care team about the use of this medication in children. Special care may be needed. Overdosage: If you think you have taken too much of this medicine contact a poison control center or emergency room at once. NOTE: This medicine is only for you. Do not share this medicine with others. What if I miss a dose? Keep appointments for follow-up doses. It is important not to miss your dose. Call your care team if you are unable to keep an appointment. What may interact with this medication? Warfarin This list may not describe all possible interactions. Give your health care provider a list of all the medicines, herbs, non-prescription drugs, or dietary supplements you use. Also tell them if you smoke, drink alcohol, or use illegal drugs. Some items may interact with your medicine. What should I watch for while using this medication? Your condition will be monitored carefully while you are receiving this medication. This medication may make you feel generally unwell. This is not uncommon as chemotherapy can affect healthy cells as well as cancer cells. Report any side effects. Continue your course of treatment even though you feel ill unless your care team tells you to stop. This medication can cause serious side effects. To reduce the risk, your care team may give you other medications to take before receiving this one. Be sure to follow the directions from your care team. This medication may increase your risk of getting an infection. Call your care team for advice if you get a fever, chills, sore throat, or other symptoms of a cold or flu. Do not treat yourself. Try to avoid being around people who are sick. This medication may increase your risk to bruise or bleed. Call your care team if you notice any unusual bleeding. Talk to  your care team about your risk of cancer. You may be more at risk for certain types of cancers if you take this medication. Talk to your care team if you may be pregnant. Serious birth defects can occur if you take this medication during pregnancy and for 6 months after the last dose. You will need a negative pregnancy test before starting this medication. Contraception is recommended while taking this medication and for 6 months after the last dose. Your care team can help you find the option that works for you. If your partner can get pregnant, use a condom during sex while taking this medication and for 4 months after the last dose. Do not breastfeed while taking this medication. This medication may cause infertility. Talk to your care team if you are concerned about your fertility. What side effects may I notice from receiving this medication? Side effects that you should report to your care team as soon as possible: Allergic reactions--skin rash, itching, hives, swelling of the face, lips, tongue, or throat Infection--fever, chills, cough, sore throat, wounds that don't heal, pain or trouble when passing urine, general feeling of discomfort or being unwell Low red blood cell level--unusual weakness or fatigue, dizziness, headache, trouble breathing Unusual bruising  or bleeding Side effects that usually do not require medical attention (report to your care team if they continue or are bothersome): Diarrhea Fatigue Hair loss Loss of appetite Nausea Vomiting This list may not describe all possible side effects. Call your doctor for medical advice about side effects. You may report side effects to FDA at 1-800-FDA-1088. Where should I keep my medication? This medication is given in a hospital or clinic. It will not be stored at home. NOTE: This sheet is a summary. It may not cover all possible information. If you have questions about this medicine, talk to your doctor, pharmacist, or health  care provider.  2023 Elsevier/Gold Standard (2021-07-28 00:00:00)

## 2021-10-21 ENCOUNTER — Other Ambulatory Visit: Payer: Self-pay

## 2021-10-21 ENCOUNTER — Inpatient Hospital Stay (HOSPITAL_BASED_OUTPATIENT_CLINIC_OR_DEPARTMENT_OTHER): Payer: Medicaid Other | Admitting: Physician Assistant

## 2021-10-21 ENCOUNTER — Inpatient Hospital Stay: Payer: Medicaid Other

## 2021-10-21 VITALS — BP 164/101 | HR 85 | Temp 98.0°F | Resp 20

## 2021-10-21 DIAGNOSIS — Z95828 Presence of other vascular implants and grafts: Secondary | ICD-10-CM

## 2021-10-21 DIAGNOSIS — R110 Nausea: Secondary | ICD-10-CM

## 2021-10-21 DIAGNOSIS — Z5111 Encounter for antineoplastic chemotherapy: Secondary | ICD-10-CM | POA: Diagnosis not present

## 2021-10-21 DIAGNOSIS — C3432 Malignant neoplasm of lower lobe, left bronchus or lung: Secondary | ICD-10-CM

## 2021-10-21 DIAGNOSIS — K5900 Constipation, unspecified: Secondary | ICD-10-CM

## 2021-10-21 MED ORDER — SODIUM CHLORIDE 0.9% FLUSH
10.0000 mL | INTRAVENOUS | Status: DC | PRN
  Administered 2021-10-21: 10 mL

## 2021-10-21 MED ORDER — HEPARIN SOD (PORK) LOCK FLUSH 100 UNIT/ML IV SOLN
500.0000 [IU] | Freq: Once | INTRAVENOUS | Status: AC | PRN
  Administered 2021-10-21: 500 [IU]

## 2021-10-21 MED ORDER — SODIUM CHLORIDE 0.9 % IV SOLN
10.0000 mg | Freq: Once | INTRAVENOUS | Status: AC
  Administered 2021-10-21: 10 mg via INTRAVENOUS
  Filled 2021-10-21: qty 10

## 2021-10-21 MED ORDER — SODIUM CHLORIDE 0.9 % IV SOLN
100.0000 mg/m2 | Freq: Once | INTRAVENOUS | Status: AC
  Administered 2021-10-21: 160 mg via INTRAVENOUS
  Filled 2021-10-21: qty 8

## 2021-10-21 MED ORDER — SODIUM CHLORIDE 0.9 % IV SOLN: Freq: Once | INTRAVENOUS | Status: AC

## 2021-10-21 NOTE — Progress Notes (Signed)
Verbal order from Lisabeth Devoid., PA for 1 L NS over 1 hour. Order under sign and held.

## 2021-10-21 NOTE — Addendum Note (Signed)
Addended by: Thyra Bre[facility] E on: 10/21/2021 10:21 AM   Modules accepted: Orders

## 2021-10-21 NOTE — Progress Notes (Signed)
Symptom Management Consult note [place]    Patient Care Team: Pcp, No as PCP - General    Name of the patient: Dakota Bryant  [id number]  11-May-1970   Date of visit: 10/21/2021    Chief complaint/ Reason for visit- constipation  Oncology History  Primary cancer of left lower lobe of lung ([place])  06/08/2021 Initial Diagnosis   Primary cancer of left lower lobe of lung ([place])   08/22/2021 Cancer Staging   Staging form: Lung, AJCC 8th Edition - Clinical stage from 08/22/2021: Stage IIIB (cT2a, cN3, cM0) - Signed by Orson Slick, MD on 08/24/2021 Stage prefix: Initial diagnosis     Current Therapy: Cisplatin and Etoposide   Last treatment:      Day 3   Cycle 4 today  Interval history- Chrystopher R Blase is a 51 y.o. with oncologic history as above presenting to [facility] today with chief complaint of constipation.  His last " normal" bowel movement was x3 days ago.  He states this morning he passed only a small hard ball of stool.  He reports yesterday he started to have some intermittent abdominal pain.  It was located around his ostomy.  Pain varied between dull and sharp.  When present he rated pain 7 out of 10 in severity.  Patient reports he has been taking a stool softener for the last month.  He took a laxative pill yesterday although does not remember what kind it was.  He states he has had decreased appetite over the last 2 days.  Denies passing any flatus yet today.  He has not had any fevers or chills.  He does endorse intermittent nausea over the last 3 days as well without emesis.  He has had 1 bottle of water to drink this morning.  Denies any back pain or urinary symptoms.  He has a prescription for narcotics however has not been taking them over the last 2 weeks.    ROS  All other systems are reviewed and are negative for acute change except as noted in the HPI.    No Known Allergies   Past Medical History:  Diagnosis Date   Acid reflux     Colostomy in place [facility])    Emphysema lung ([place])    Hypertension    Mass of left lung    Perforated diverticulum      Past Surgical History:  Procedure Laterality Date   BRONCHIAL BRUSHINGS  07/26/2021   Procedure: BRONCHIAL BRUSHINGS;  Surgeon: Margaretha Seeds, MD;  Location: [facility];  Service: Cardiopulmonary;;   BRONCHIAL WASHINGS  07/26/2021   Procedure: BRONCHIAL WASHINGS;  Surgeon: Margaretha Seeds, MD;  Location: [facility];  Service: Cardiopulmonary;;   COLOSTOMY N/A 06/03/2021   Procedure: SIGMOID COLECTOMY WITH COLOSTOMY;  Surgeon: Virl Cagey, MD;  Location: [facility];  Service: General;  Laterality: N/A;   ENDOBRONCHIAL ULTRASOUND Bilateral 07/26/2021   Procedure: ENDOBRONCHIAL ULTRASOUND;  Surgeon: Margaretha Seeds, MD;  Location: [facility];  Service: Cardiopulmonary;  Laterality: Bilateral;   FINE NEEDLE ASPIRATION  07/26/2021   Procedure: FINE NEEDLE ASPIRATION;  Surgeon: Margaretha Seeds, MD;  Location: [facility];  Service: Cardiopulmonary;;   HAND TENDON SURGERY     IR IMAGING GUIDED PORT INSERTION  08/10/2021   KNEE ARTHROSCOPY Left    VIDEO BRONCHOSCOPY  07/26/2021   Procedure: VIDEO BRONCHOSCOPY WITHOUT FLUORO;  Surgeon: Margaretha Seeds, MD;  Location: [facility];  Service: Cardiopulmonary;;  Social History   Socioeconomic History   Marital status: Divorced    Spouse name: Not on file   Number of children: Not on file   Years of education: Not on file   Highest education level: Not on file  Occupational History   Not on file  Tobacco Use   Smoking status: Every Day    Packs/day: 1.00    Years: 36.00    Total pack years: 36.00    Types: Cigarettes    Passive exposure: Current   Smokeless tobacco: Never   Tobacco comments:    Started smoking pack a day since 14  Vaping Use   Vaping Use: Never used  Substance and Sexual Activity   Alcohol use: Yes    Alcohol/week: 2.0 standard drinks of alcohol    Types: 2 Cans of beer per week     Comment: occasional   Drug use: Yes    Types: Marijuana    Comment: occ   Sexual activity: Not on file  Other Topics Concern   Not on file  Social History Narrative   Not on file   Social Determinants of Health   Financial Resource Strain: High Risk (09/13/2021)   Overall Financial Resource Strain (CARDIA)    Difficulty of Paying Living Expenses: Hard  Food Insecurity: Not on file  Transportation Needs: Not on file  Physical Activity: Not on file  Stress: Not on file  Social Connections: Not on file  Intimate Partner Violence: Not on file    No family history on file.   Current Outpatient Medications:    acetaminophen (TYLENOL) 500 MG tablet, Take 1,000 mg by mouth every 8 (eight) hours as needed for headache or moderate pain., Disp: , Rfl:    amLODipine (NORVASC) 10 MG tablet, Take 1 tablet (10 mg total) by mouth daily., Disp: 30 tablet, Rfl: 1   cetirizine (ZYRTEC) 10 MG tablet, Take 1 tablet by mouth once daily, Disp: 30 tablet, Rfl: 0   citalopram (CELEXA) 20 MG tablet, Take 1 tablet (20 mg total) by mouth daily., Disp: 30 tablet, Rfl: 2   esomeprazole (NEXIUM) 20 MG capsule, Take 20 mg by mouth daily., Disp: , Rfl:    hydrOXYzine (ATARAX) 25 MG tablet, Take 1 tablet (25 mg total) by mouth every 6 (six) hours as needed for anxiety or nausea., Disp: 30 tablet, Rfl: 0   labetalol (NORMODYNE) 200 MG tablet, Take 1 tablet (200 mg total) by mouth 2 (two) times daily., Disp: 60 tablet, Rfl: 3   lidocaine-prilocaine (EMLA) cream, Apply 1 application. topically as needed., Disp: 30 g, Rfl: 0   Multiple Vitamin (MULTIVITAMIN WITH MINERALS) TABS tablet, Take 1 tablet by mouth daily., Disp: 120 tablet, Rfl: 3   naphazoline-pheniramine (VISINE) 0.025-0.3 % ophthalmic solution, Place 1 drop into both eyes 4 (four) times daily as needed for eye irritation., Disp: , Rfl:    omeprazole (PRILOSEC) 40 MG capsule, Take 1 capsule (40 mg total) by mouth daily., Disp: 30 capsule, Rfl: 2    ondansetron (ZOFRAN) 8 MG tablet, Take 1 tablet (8 mg total) by mouth every 8 (eight) hours as needed., Disp: 30 tablet, Rfl: 0   oxyCODONE (ROXICODONE) 5 MG immediate release tablet, Take 1 tablet (5 mg total) by mouth every 4 (four) hours as needed for severe pain or breakthrough pain. No alcohol while taking this (Patient not taking: Reported on 09/27/2021), Disp: 30 tablet, Rfl: 0   oxymetazoline (AFRIN) 0.05 % nasal spray, Place 1 spray into both nostrils  See admin instructions. Instill 1 spray in each nostril once daily, may use a second time as needed for congestion, Disp: , Rfl:    prochlorperazine (COMPAZINE) 10 MG tablet, Take 1 tablet (10 mg total) by mouth every 6 (six) hours as needed for nausea or vomiting. (Patient not taking: Reported on 09/27/2021), Disp: 30 tablet, Rfl: 0   senna-docusate (SENNA S) 8.6-50 MG tablet, Take 1 tablet by mouth daily. May increase to 2 daily at bedtime as needed, Disp: , Rfl:    sucralfate (CARAFATE) 1 g tablet, Take 1 tablet (1 g total) by mouth 4 (four) times daily -  with meals and at bedtime. 5 min before meals for radiation induced esophagitis, Disp: 120 tablet, Rfl: 2   tamsulosin (FLOMAX) 0.4 MG CAPS capsule, Take 1 capsule (0.4 mg total) by mouth daily., Disp: 30 capsule, Rfl: 2   traZODone (DESYREL) 50 MG tablet, Take 1 tablet (50 mg total) by mouth at bedtime., Disp: 30 tablet, Rfl: 4   umeclidinium-vilanterol (ANORO ELLIPTA) 62.5-25 MCG/ACT AEPB, Inhale 1 puff into the lungs daily., Disp: 180 each, Rfl: 3 No current facility-administered medications for this visit.  Facility-Administered Medications Ordered in Other Visits:    sodium chloride flush (NS) 0.9 % injection 10 mL, 10 mL, Intracatheter, PRN, Ledell Peoples IV, MD, 10 mL at 10/21/21 1204  PHYSICAL EXAM: ECOG FS:1 - Symptomatic but completely ambulatory   T: 98   BP: 164/101   HR: 85       Resp: 20        O2: 99% Physical Exam Vitals and nursing note reviewed.  Constitutional:       Appearance: He is well-developed. He is not ill-appearing or toxic-appearing.     Comments: Thin appearing male  HENT:     Head: Normocephalic and atraumatic.     Nose: Nose normal.  Eyes:     General: No scleral icterus.       Right eye: No discharge.        Left eye: No discharge.     Conjunctiva/sclera: Conjunctivae normal.  Neck:     Vascular: No JVD.  Cardiovascular:     Rate and Rhythm: Normal rate and regular rhythm.     Pulses: Normal pulses.     Heart sounds: Normal heart sounds.  Pulmonary:     Effort: Pulmonary effort is normal.     Breath sounds: Normal breath sounds.  Abdominal:     General: Bowel sounds are normal. There is no distension.     Palpations: Abdomen is soft. There is no mass.     Tenderness: There is no abdominal tenderness. There is no guarding or rebound.     Hernia: No hernia is present.     Comments: Patient passing soft brown stool during exam. Ostomy without signs of infection.  Musculoskeletal:        General: Normal range of motion.     Cervical back: Normal range of motion.  Skin:    General: Skin is warm and dry.  Neurological:     Mental Status: He is oriented to person, place, and time.     GCS: GCS eye subscore is 4. GCS verbal subscore is 5. GCS motor subscore is 6.     Comments: Fluent speech, no facial droop.  Psychiatric:        Behavior: Behavior normal.        LABORATORY DATA: I have reviewed the data as listed    Latest Ref Rng &  Units 10/19/2021    8:10 AM 09/27/2021   10:17 AM 09/07/2021    8:34 AM  CBC  WBC 4.0 - 10.5 K/uL 2.6  2.2  3.0   Hemoglobin 13.0 - 17.0 g/dL 13.1  13.1  13.4   Hematocrit 39.0 - 52.0 % 35.5  34.9  36.6   Platelets 150 - 400 K/uL 207  161  208         Latest Ref Rng & Units 10/19/2021    8:10 AM 09/27/2021   10:17 AM 09/07/2021    8:34 AM  CMP  Glucose 70 - 99 mg/dL 128  89  80   BUN 6 - 20 mg/dL 6  6  6    Creatinine 0.61 - 1.24 mg/dL 0.67  0.60  0.59   Sodium 135 - 145 mmol/L 132  125   129   Potassium 3.5 - 5.1 mmol/L 4.0  4.1  4.0   Chloride 98 - 111 mmol/L 99  93  96   CO2 22 - 32 mmol/L 27  28  29    Calcium 8.9 - 10.3 mg/dL 9.0  9.2  9.2   Total Protein 6.5 - 8.1 g/dL 6.6  6.3  6.4   Total Bilirubin 0.3 - 1.2 mg/dL 0.3  0.5  0.4   Alkaline Phos 38 - 126 U/L 49  40  47   AST 15 - 41 U/L 12  13  11    ALT 0 - 44 U/L 10  9  9         RADIOGRAPHIC STUDIES (from last 24 hours if applicable) I have personally reviewed the radiological images as listed and agreed with the findings in the report. No results found.     ASSESSMENT & PLAN: Patient is a 51 y.o. 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 Lorenso Courier.  I have viewed most recent oncology note and lab work.   #)Constipation-patient is nontoxic-appearing.  He was experiencing constipation over the last 3 days however is actively having bowel movement during my exam.  He has a nontender abdomen.  Discussed with patient symptomatic constipation care.  We will have patient return tomorrow for IV fluids to help keep him hydrated. Strict ED precautions discussed should symptoms worsen.   #) Small Cell lung cancer- Next appointment with oncologist is 11/24/21.   Visit Diagnosis: 1. Nausea without vomiting   2. Port-A-Cath in place   3. Primary cancer of left lower lobe of lung ([place])   4. Constipation, unspecified constipation type      No orders of the defined types were placed in this encounter.   All questions were answered. The patient knows to call the clinic with any problems, questions or concerns. No barriers to learning was detected.  I have spent a total of 20 minutes minutes of face-to-face and non-face-to-face time, preparing to see the patient, obtaining and/or reviewing separately obtained history, performing a medically appropriate examination, counseling and educating the patient, ordering tests, documenting clinical information in the electronic health record, and care  coordination (communications with other health care professionals or caregivers).    Thank you for allowing me to participate in the care of this patient.    Barrie Folk, PA-C Department of Hematology/Oncology [facility] at [facility] Phone: [phone]  [phone]    10/21/2021 12:13 PM

## 2021-10-21 NOTE — Patient Instructions (Addendum)
Tomorrow you have an appointment at 11 AM for IV fluids.  To help with your constipation we recommend that you keep taking the stool softener until your stool is consistently soft.  You can take it twice a day until that happens.  Also recommend that you try drinking MiraLAX (any brand is acceptable.  Look for the bottle to say comparable to MiraLAX).  You can drink MiraLAX twice a day as a " clean out" as we discussed.  Once your bowels are moving regularly you can continue to drink it once daily until your stool becomes too soft.  At that point discontinue and restart later if you become constipated again.  If you start having large bowel movements make sure you stay hydrated by drinking plenty of fluids.  If your pain worsens or you have any concerning symptoms please go to the emergency room for evaluation.

## 2021-10-21 NOTE — Patient Instructions (Signed)
[facility] MEDICAL ONCOLOGY   Discharge Instructions: Thank you for choosing [facility] to provide your oncology and hematology care.   If you have a lab appointment with the Cancer Center, please go directly to the Cancer Center and check in at the registration area.   Wear comfortable clothing and clothing appropriate for easy access to any Portacath or PICC line.   We strive to give you quality time with your provider. You may need to reschedule your appointment if you arrive late (15 or more minutes).  Arriving late affects you and other patients whose appointments are after yours.  Also, if you miss three or more appointments without notifying the office, you may be dismissed from the clinic at the provider's discretion.      For prescription refill requests, have your pharmacy contact our office and allow 72 hours for refills to be completed.    Today you received the following chemotherapy and/or immunotherapy agents: etoposide      To help prevent nausea and vomiting after your treatment, we encourage you to take your nausea medication as directed.  BELOW ARE SYMPTOMS THAT SHOULD BE REPORTED IMMEDIATELY: *FEVER GREATER THAN 100.4 F (38 C) OR HIGHER *CHILLS OR SWEATING *NAUSEA AND VOMITING THAT IS NOT CONTROLLED WITH YOUR NAUSEA MEDICATION *UNUSUAL SHORTNESS OF BREATH *UNUSUAL BRUISING OR BLEEDING *URINARY PROBLEMS (pain or burning when urinating, or frequent urination) *BOWEL PROBLEMS (unusual diarrhea, constipation, pain near the anus) TENDERNESS IN MOUTH AND THROAT WITH OR WITHOUT PRESENCE OF ULCERS (sore throat, sores in mouth, or a toothache) UNUSUAL RASH, SWELLING OR PAIN  UNUSUAL VAGINAL DISCHARGE OR ITCHING   Items with * indicate a potential emergency and should be followed up as soon as possible or go to the Emergency Department if any problems should occur.  Please show the CHEMOTHERAPY ALERT CARD or IMMUNOTHERAPY ALERT CARD at check-in  to the Emergency Department and triage nurse.  Should you have questions after your visit or need to cancel or reschedule your appointment, please contact [facility] MEDICAL ONCOLOG[facility]  Dept: [phone]  and follow the prompts.  Office hours are 8:00 a.m. to 4:30 p.m. Monday - Friday. Please note that voicemails left after 4:00 p.m. may not be returned until the following business day.  We are closed weekends and major holidays. You have access to a nurse at all times for urgent questions. Please call the main number to the clinic Dept: [phone] and follow the prompts.   For any non-urgent questions, you may also contact your provider using MyChart. We now offer e-Visits for anyone 18 and older to request care online for non-urgent symptoms. For details visit [email or web address].   Also download the MyChart app! Go to the app store, search "MyChart", open the app, select [facility], and log in with your MyChart username and password.  Masks are optional in the cancer centers. If you would like for your care team to wear a mask while they are taking care of you, please let them know. You may have one support person who is at least 53 years old accompany you for your appointments. 

## 2021-10-22 ENCOUNTER — Inpatient Hospital Stay: Payer: Medicaid Other

## 2021-10-22 VITALS — BP 111/80 | HR 91 | Temp 97.4°F

## 2021-10-22 DIAGNOSIS — Z5111 Encounter for antineoplastic chemotherapy: Secondary | ICD-10-CM | POA: Diagnosis not present

## 2021-10-22 DIAGNOSIS — Z95828 Presence of other vascular implants and grafts: Secondary | ICD-10-CM

## 2021-10-22 DIAGNOSIS — C3432 Malignant neoplasm of lower lobe, left bronchus or lung: Secondary | ICD-10-CM

## 2021-10-22 MED ORDER — HEPARIN SOD (PORK) LOCK FLUSH 100 UNIT/ML IV SOLN
500.0000 [IU] | Freq: Once | INTRAVENOUS | Status: AC
  Administered 2021-10-22: 500 [IU]

## 2021-10-22 MED ORDER — SODIUM CHLORIDE 0.9% FLUSH
10.0000 mL | Freq: Once | INTRAVENOUS | Status: AC
  Administered 2021-10-22: 10 mL

## 2021-10-22 MED ORDER — SODIUM CHLORIDE 0.9 % IV SOLN: Freq: Once | INTRAVENOUS | Status: AC

## 2021-10-24 ENCOUNTER — Encounter: Payer: Self-pay | Admitting: Hematology and Oncology

## 2021-10-24 ENCOUNTER — Encounter: Payer: Self-pay | Admitting: Pulmonary Disease

## 2021-10-24 ENCOUNTER — Other Ambulatory Visit: Payer: Self-pay

## 2021-10-24 ENCOUNTER — Ambulatory Visit (INDEPENDENT_AMBULATORY_CARE_PROVIDER_SITE_OTHER): Payer: Self-pay | Admitting: Pulmonary Disease

## 2021-10-24 VITALS — BP 112/70 | HR 77 | Temp 98.1°F | Ht 65.0 in | Wt 125.0 lb

## 2021-10-24 DIAGNOSIS — R918 Other nonspecific abnormal finding of lung field: Secondary | ICD-10-CM

## 2021-10-24 DIAGNOSIS — J432 Centrilobular emphysema: Secondary | ICD-10-CM

## 2021-10-24 MED ORDER — ANORO ELLIPTA 62.5-25 MCG/ACT IN AEPB
1.0000 | INHALATION_SPRAY | Freq: Every day | RESPIRATORY_TRACT | 5 refills | Status: DC
  Filled 2021-10-24: qty 60, 60d supply, fill #0

## 2021-10-24 NOTE — Progress Notes (Signed)
Subjective:   PATIENT ID: Dakota Bryant GENDER: male DOB: [birth year], MRN: [id number]   HPI  Chief Complaint  Patient presents with   Follow-up    PFT performed today.  Pt states he has been doing okay since last visit and denies any complaints.    Reason for Visit: Follow-up  Ms. Dakota Bryant is a 51 year old active smoker with HTN, reflux, s/p colostomy for diverticulitis who presents for follow-up  Initial consult He was recently hospitalized for abdominal pain and found with diverticulitis with perforation requiring colostomy. He has reports hot flashes that resolved after his surgery. Found with incidental left lung mass on CT. Seen by Pulmonary consults with Dr. Melvyn Novas with plan for outpatient care. Reports chronic cough, usually dry and occurs in the morning. Denies wheezing. Some SOB with exertion however does not limit his activity. Able to walk without stopping. Can climb flight of stairs. Denies hemoptysis, weight loss.  07/19/21 His symptoms of cough and shortness of breaeth are unchanged since our last visit. He is here today to review his CT scan and discuss next steps for his care. Continues to smoke.  10/24/21 Since our last visit he has been on chemotherapy for ~ 3 months. Will feel ill after the initial few days. Trying to eat better. Still smoking 1.5 ppd. Has shortness of breath and cough that is unchanged. Was on Anoro but self-discontinued after 3 weeks as he felt this worsened respiratory symptoms.  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 Currently smokes Never vape Quit drinking in March 2023  Past Medical History:  Diagnosis Date   Acid reflux    Colostomy in place [facility])    Emphysema lung ([place])    Hypertension    Mass of left lung    Perforated diverticulum      No family history on file.   Social History   Occupational History   Not on file  Tobacco Use   Smoking status: Every Day    Packs/day: 3.00     Years: 39.00    Total pack years: 117.00    Types: Cigarettes    Start date: 1983    Passive exposure: Current   Smokeless tobacco: Never   Tobacco comments:    Currently smoking 1.5ppd as of 10/24/21 ep  Vaping Use   Vaping Use: Never used  Substance and Sexual Activity   Alcohol use: Yes    Alcohol/week: 2.0 standard drinks of alcohol    Types: 2 Cans of beer per week    Comment: occasional   Drug use: Yes    Types: Marijuana    Comment: occ   Sexual activity: Not on file    No Known Allergies   Outpatient Medications Prior to Visit  Medication Sig Dispense Refill   acetaminophen (TYLENOL) 500 MG tablet Take 1,000 mg by mouth every 8 (eight) hours as needed for headache or moderate pain.     amLODipine (NORVASC) 10 MG tablet Take 1 tablet (10 mg total) by mouth daily. 30 tablet 1   cetirizine (ZYRTEC) 10 MG tablet Take 1 tablet by mouth once daily 30 tablet 0   citalopram (CELEXA) 20 MG tablet Take 1 tablet (20 mg total) by mouth daily. 30 tablet 2   esomeprazole (NEXIUM) 20 MG capsule Take 20 mg by mouth daily.     hydrOXYzine (ATARAX) 25 MG tablet Take 1 tablet (25 mg total) by mouth every 6 (six) hours as  needed for anxiety or nausea. 30 tablet 0   labetalol (NORMODYNE) 200 MG tablet Take 1 tablet (200 mg total) by mouth 2 (two) times daily. 60 tablet 3   lidocaine-prilocaine (EMLA) cream Apply 1 application. topically as needed. 30 g 0   Multiple Vitamin (MULTIVITAMIN WITH MINERALS) TABS tablet Take 1 tablet by mouth daily. 120 tablet 3   naphazoline-pheniramine (VISINE) 0.025-0.3 % ophthalmic solution Place 1 drop into both eyes 4 (four) times daily as needed for eye irritation.     ondansetron (ZOFRAN) 8 MG tablet Take 1 tablet (8 mg total) by mouth every 8 (eight) hours as needed. 30 tablet 0   oxyCODONE (ROXICODONE) 5 MG immediate release tablet Take 1 tablet (5 mg total) by mouth every 4 (four) hours as needed for severe pain or breakthrough pain. No alcohol while taking  this 30 tablet 0   oxymetazoline (AFRIN) 0.05 % nasal spray Place 1 spray into both nostrils See admin instructions. Instill 1 spray in each nostril once daily, may use a second time as needed for congestion     prochlorperazine (COMPAZINE) 10 MG tablet Take 1 tablet (10 mg total) by mouth every 6 (six) hours as needed for nausea or vomiting. 30 tablet 0   senna-docusate (SENNA S) 8.6-50 MG tablet Take 1 tablet by mouth daily. May increase to 2 daily at bedtime as needed     sucralfate (CARAFATE) 1 g tablet Take 1 tablet (1 g total) by mouth 4 (four) times daily -  with meals and at bedtime. 5 min before meals for radiation induced esophagitis 120 tablet 2   tamsulosin (FLOMAX) 0.4 MG CAPS capsule Take 1 capsule (0.4 mg total) by mouth daily. 30 capsule 2   traZODone (DESYREL) 50 MG tablet Take 1 tablet (50 mg total) by mouth at bedtime. 30 tablet 4   omeprazole (PRILOSEC) 40 MG capsule Take 1 capsule (40 mg total) by mouth daily. (Patient not taking: Reported on 10/24/2021) 30 capsule 2   umeclidinium-vilanterol (ANORO ELLIPTA) 62.5-25 MCG/ACT AEPB Inhale 1 puff into the lungs daily. (Patient not taking: Reported on 10/24/2021) 180 each 3   No facility-administered medications prior to visit.    Review of Systems  Constitutional:  Negative for chills, diaphoresis, fever, malaise/fatigue and weight loss.  HENT:  Negative for congestion.   Respiratory:  Positive for cough and shortness of breath. Negative for hemoptysis, sputum production and wheezing.   Cardiovascular:  Negative for chest pain, palpitations and leg swelling.     Objective:   Vitals:   10/24/21 1338  BP: 112/70  Pulse: 77  Temp: 98.1 F (36.7 C)  TempSrc: Oral  SpO2: 99%  Weight: 125 lb (56.7 kg)  Height: 5\' 5"  (1.651 m)    SpO2: 99 % (RA) O2 Device: None (Room air)  Physical Exam: General: Chronically ill-appearing, chemo-induced alopecia, no acute distress HENT: [place], AT Eyes: EOMI, no scleral  icterus Respiratory: Clear to auscultation bilaterally.  No crackles, wheezing or rales Cardiovascular: RRR, -M/R/G, no JVD Extremities:-Edema,-tenderness Neuro: AAO x4, CNII-XII grossly intact Psych: Normal mood, normal affect  Data Reviewed:  Imaging: CT Chest 05/27/2018 - No mediastinal adenopathy. Mild centrilobular emphysema. Bilateral ground glass nodularity. Splenic laceration. CT Chest 06/08/21 - Left lower lobe infrahilar mass measuring 2.2 x 1.5 cm, adjacent to aorta. Mild emphysema. Bilateral patchy ground glass opacities in the upper lungs PET/CT 0/1/60 - Hypermetabolic LLL mass with contralateral mediastinal and hilar adenopathy. 59mm RUL nodule  PFT: 10/24/21 FVC 3.94 (93%) FEV1 2.94 (  90%) Ratio 71  TLC 106% RV 136% DLCO 64%. No significant +BD however does not preclude benefit of therapy. Interpretation: No obstructive defect. Air trapping noted. Mildly reduced gas exchange.  Labs: CBC    Component Value Date/Time   WBC 2.6 (L) 10/19/2021 0810   WBC 11.3 (H) 06/08/2021 0414   RBC 3.80 (L) 10/19/2021 0810   HGB 13.1 10/19/2021 0810   HCT 35.5 (L) 10/19/2021 0810   PLT 207 10/19/2021 0810   MCV 93.4 10/19/2021 0810   MCH 34.5 (H) 10/19/2021 0810   MCHC 36.9 (H) 10/19/2021 0810   RDW 16.7 (H) 10/19/2021 0810   LYMPHSABS 0.6 (L) 10/19/2021 0810   MONOABS 0.5 10/19/2021 0810   EOSABS 0.1 10/19/2021 0810   BASOSABS 0.0 10/19/2021 0810   Absolute eos  06/08/21 -300 10/19/21 -100    Assessment & Plan:   Discussion: 51 year old active smoker with HTN, reflux, s/p colostomy for diverticulitis who presents for follow-up. Tolerating chemo however stress has increased his nicotine use. Not compliant with inhalers. Discussed bronchodilator options however due to limited financial resources, Anoro has best assistance program for him. He is willing to retry inhaler. Discussed clinical course and management of COPD including tobacco cessation, bronchodilator regimen and action plan  for exacerbation.  Small cell lung cancer --CONTINUE care with Oncology  Emphysema --PFTs reviewed. No obstructive defect but air trapping present and reduced DLCO. --RESTART Anoro ONE puff ONCE a day  Tobacco abuse Patient is an active smoker. Goal for 1 ppd We discussed smoking cessation for <3 minutes. We discussed triggers and stressors and ways to deal with them. We discussed barriers to continued smoking and benefits of smoking cessation. Provided patient with information cessation techniques and interventions   Health Maintenance Immunization History  Administered Date(s) Administered   Td 06/26/2015   CT Lung Screen - not qualified due to abnormal CT and age  No orders of the defined types were placed in this encounter.  Meds ordered this encounter  Medications   umeclidinium-vilanterol (ANORO ELLIPTA) 62.5-25 MCG/ACT AEPB    Sig: Inhale 1 puff into the lungs daily.    Dispense:  60 each    Refill:  5    Return in about 6 months (around 04/26/2022).  I have spent a total time of 32-minutes on the day of the appointment including chart review, data review, collecting history, coordinating care and discussing medical diagnosis and plan with the patient/family. Past medical history, allergies, medications were reviewed. Pertinent imaging, labs and tests included in this note have been reviewed and interpreted independently by me.  Fel[place], MD [facility] 10/24/2021 2:34 PM  Office Number [phone]

## 2021-10-24 NOTE — Patient Instructions (Signed)
Small cell lung cancer --CONTINUE care with Oncology  Emphysema --RESTART Anoro ONE puff ONCE a day  Tobacco abuse Patient is an active smoker. Goal for 1 ppd We discussed smoking cessation for <3 minutes. We discussed triggers and stressors and ways to deal with them. We discussed barriers to continued smoking and benefits of smoking cessation. Provided patient with information cessation techniques and interventions  Follow-up with me in 6 months

## 2021-10-24 NOTE — Progress Notes (Signed)
PFT done today. 

## 2021-10-25 ENCOUNTER — Ambulatory Visit: Payer: Self-pay | Admitting: Pulmonary Disease

## 2021-11-07 ENCOUNTER — Encounter: Payer: Self-pay | Admitting: Hematology and Oncology

## 2021-11-07 NOTE — Progress Notes (Signed)
  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  Beams/energy:   A five field 3D conformal treatment arrangement was used delivering 6 and 10 MV photons.  Daily image-guidance CT was used to align the treatment with the targeted volume  Narrative: The patient tolerated radiation treatment relatively well.  The patient experienced some esophagitis characterized as moderate.  The patient also noted fatigue.  Plan: The patient has completed radiation treatment. The patient will return to radiation oncology clinic for routine followup in one month. I advised him to call or return sooner if he has any questions or concerns related to his recovery or treatment.  ________________________________  Sheral Apley. Tammi Klippel, M.D.

## 2021-11-09 ENCOUNTER — Encounter: Payer: Self-pay | Admitting: Hematology and Oncology

## 2021-11-13 LAB — PULMONARY FUNCTION TEST
DL/VA % pred: 60 %
DL/VA: 2.7 ml/min/mmHg/L
DLCO cor % pred: 67 %
DLCO cor: 16.62 ml/min/mmHg
DLCO unc % pred: 64 %
DLCO unc: 15.87 ml/min/mmHg
FEF 25-75 Post: 2.36 L/sec
FEF 25-75 Pre: 1.98 L/sec
FEF2575-%Change-Post: 18 %
FEF2575-%Pred-Post: 80 %
FEF2575-%Pred-Pre: 67 %
FEV1-%Change-Post: 5 %
FEV1-%Pred-Post: 90 %
FEV1-%Pred-Pre: 85 %
FEV1-Post: 2.94 L
FEV1-Pre: 2.78 L
FEV1FVC-%Change-Post: 5 %
FEV1FVC-%Pred-Pre: 91 %
FEV6-%Change-Post: 1 %
FEV6-%Pred-Post: 96 %
FEV6-%Pred-Pre: 95 %
FEV6-Post: 3.9 L
FEV6-Pre: 3.85 L
FEV6FVC-%Change-Post: 0 %
FEV6FVC-%Pred-Post: 103 %
FEV6FVC-%Pred-Pre: 102 %
FVC-%Change-Post: 0 %
FVC-%Pred-Post: 93 %
FVC-%Pred-Pre: 93 %
FVC-Post: 3.94 L
FVC-Pre: 3.91 L
Post FEV1/FVC ratio: 75 %
Post FEV6/FVC ratio: 99 %
Pre FEV1/FVC ratio: 71 %
Pre FEV6/FVC Ratio: 98 %
RV % pred: 136 %
RV: 2.46 L
TLC % pred: 106 %
TLC: 6.35 L

## 2021-11-14 ENCOUNTER — Encounter: Payer: Self-pay | Admitting: Hematology and Oncology

## 2021-11-14 NOTE — Progress Notes (Signed)
Patient called to obtain information regarding the [employer]. He did not have expense sheet with him to follow but discussed in detail expenses and how they are covered. Requested on next appointment 9/7, copy of sheet be given to him and also gift card. He verbalized understanding.  He has my card for any additional financial questions or concerns.

## 2021-11-15 ENCOUNTER — Telehealth: Payer: Self-pay

## 2021-11-15 ENCOUNTER — Encounter: Payer: Self-pay | Admitting: Urology

## 2021-11-15 NOTE — Progress Notes (Signed)
Telephone appointment. I spoke w/ patient's stepmother Mrs. Tammy Paiz, verified her identity and began nursing interview. No issues reported at this time.  Meaningful use complete.  Reminded Tammy of patient's 10:00am-11/16/21 telephone appointment w/ Ashlyn Bruning PA-C. I left my extension [phone] in case patient needs anything. Understanding verbalized.  Patient contact [phone] (mobile) or [phone] (Tammy Keady)

## 2021-11-15 NOTE — Telephone Encounter (Signed)
Voicemail reminder for patient's 10:00am-11/16/21 telephone appointment w/ Ashlyn Bruning PA-C. I left my extension [phone] and requested that patient return my call for completion of the nursing portion.

## 2021-11-16 ENCOUNTER — Other Ambulatory Visit: Payer: Self-pay | Admitting: Urology

## 2021-11-16 ENCOUNTER — Telehealth: Payer: Self-pay | Admitting: *Deleted

## 2021-11-16 ENCOUNTER — Ambulatory Visit
Admission: RE | Admit: 2021-11-16 | Discharge: 2021-11-16 | Disposition: A | Discharge status: Home / Self Care | Payer: Self-pay | Source: Ambulatory Visit | Attending: Urology | Admitting: Urology

## 2021-11-16 DIAGNOSIS — K148 Other diseases of tongue: Secondary | ICD-10-CM

## 2021-11-16 DIAGNOSIS — C3412 Malignant neoplasm of upper lobe, left bronchus or lung: Secondary | ICD-10-CM | POA: Insufficient documentation

## 2021-11-16 NOTE — Progress Notes (Signed)
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  Narrative:  The patient returns today for routine follow-up.  He  tolerated radiation treatment relatively well.  He did experience some esophagitis characterized as moderate and also noted fatigue.                              On review of systems, the patient states that he is doing well in general.  He has just recently completed his fourth cycle of systemic therapy and is gradually regaining his strength and energy.  He continues with mild esophagitis and is using Carafate as prescribed.  He reports a healthy appetite and is maintaining his weight.  He has a chronic cough with clear to whitish sputum which has remained unchanged.  He specifically denies increased shortness of breath, chest pain, hemoptysis or dyspnea.  He has not had any recent fevers, chills or night sweats.  He does continue with muffled hearing and an irritating spot on his tongue that occasionally gets caught in his teeth or nicked when he is chewing.  He was initially referred to ENT in June 2023 for further evaluation but was unable to see them due to lack of insurance.  He now has Medicaid and is interested in a new referral to ENT for further evaluation.  Otherwise, overall, he is quite pleased with his progress to date.  ALLERGIES:  has No Known Allergies.  Meds: Current Outpatient Medications  Medication Sig Dispense Refill   acetaminophen (TYLENOL) 500 MG tablet Take 1,000 mg by mouth every 8 (eight) hours as needed for headache or  moderate pain.     amLODipine (NORVASC) 10 MG tablet Take 1 tablet (10 mg total) by mouth daily. 30 tablet 1   cetirizine (ZYRTEC) 10 MG tablet Take 1 tablet by mouth once daily 30 tablet 0   citalopram (CELEXA) 20 MG tablet Take 1 tablet (20 mg total) by mouth daily. 30 tablet 2   esomeprazole (NEXIUM) 20 MG capsule Take 20 mg by mouth daily.     hydrOXYzine (ATARAX) 25 MG tablet Take 1 tablet (25 mg total) by mouth every 6 (six) hours as needed for anxiety or nausea. 30 tablet 0   labetalol (NORMODYNE) 200 MG tablet Take 1 tablet (200 mg total) by mouth 2 (two) times daily. 60 tablet 3   lidocaine-prilocaine (EMLA) cream Apply 1 application. topically as needed. 30 g 0   Multiple Vitamin (MULTIVITAMIN WITH MINERALS) TABS tablet Take 1 tablet by mouth daily. 120 tablet 3   naphazoline-pheniramine (VISINE) 0.025-0.3 % ophthalmic solution Place 1 drop into both eyes 4 (four) times daily as needed for eye irritation.     omeprazole (PRILOSEC) 40 MG capsule Take 1 capsule (40 mg total) by mouth daily. (Patient not taking: Reported on 10/24/2021) 30 capsule 2   ondansetron (ZOFRAN) 8 MG tablet Take 1 tablet (8 mg total) by mouth every 8 (eight) hours as needed. 30 tablet 0  oxyCODONE (ROXICODONE) 5 MG immediate release tablet Take 1 tablet (5 mg total) by mouth every 4 (four) hours as needed for severe pain or breakthrough pain. No alcohol while taking this 30 tablet 0   oxymetazoline (AFRIN) 0.05 % nasal spray Place 1 spray into both nostrils See admin instructions. Instill 1 spray in each nostril once daily, may use a second time as needed for congestion     prochlorperazine (COMPAZINE) 10 MG tablet Take 1 tablet (10 mg total) by mouth every 6 (six) hours as needed for nausea or vomiting. 30 tablet 0   senna-docusate (SENNA S) 8.6-50 MG tablet Take 1 tablet by mouth daily. May increase to 2 daily at bedtime as needed     sucralfate (CARAFATE) 1 g tablet Take 1 tablet (1 g total) by mouth 4 (four) times  daily -  with meals and at bedtime. 5 min before meals for radiation induced esophagitis 120 tablet 2   tamsulosin (FLOMAX) 0.4 MG CAPS capsule Take 1 capsule (0.4 mg total) by mouth daily. 30 capsule 2   traZODone (DESYREL) 50 MG tablet Take 1 tablet (50 mg total) by mouth at bedtime. 30 tablet 4   umeclidinium-vilanterol (ANORO ELLIPTA) 62.5-25 MCG/ACT AEPB Inhale 1 puff into the lungs daily. 60 each 5   No current facility-administered medications for this encounter.    Physical Findings:  vitals were not taken for this visit.  Pain Assessment Pain Score: 0-No pain/10 Unable to assess due to telephone follow-up visit format.  Lab Findings: Lab Results  Component Value Date   WBC 2.6 (L) 10/19/2021   HGB 13.1 10/19/2021   HCT 35.5 (L) 10/19/2021   MCV 93.4 10/19/2021   PLT 207 10/19/2021     Radiographic Findings: No results found.  Impression/Plan: 10. 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 He appears to be recovering well from the effects of his recent chemoradiation.  He is scheduled for a posttreatment CT chest scan on 11/22/2021, prior to his upcoming follow-up visit with Dr. Lorenso Courier on 11/24/2021.  Pending this scan shows an excellent response to treatment and there is no further need for systemic therapy, he would be eligible to proceed with prophylactic cranial irradiation (PCI).  Regarding his lung disease, we discussed that while we are happy to continue to participate in his care if clinically indicated, at this time, we will plan to see him back on an as-needed basis as he will continue in routine follow-up under the care and direction of Dr. Lorenso Courier. 2. Prophylactic cranial irradiation- We discussed the increased risk of potential brain metastasis in the setting of small cell lung cancer and the recommendation for PCI to help reduce the risk from approximately 60% down to around 15% as well as an improvement in overall survival.  We reviewed  the anticipated 2-week course of daily treatment as well as the acute and late side effects to be expected.  Prior to proceeding with treatment, we would need to obtain a baseline MRI brain scan and pending this scan is negative, we would proceed with scheduling CT simulation/treatment planning in anticipation of beginning a 10 fraction course of daily radiotherapy for total dose of 25 Gy.  If there are any lesions present on the scan, we would still proceed with CT simulation/treatment planning in anticipation of beginning a 10 fraction course of daily radiotherapy for a total dose of 30 Gy.  The patient was encouraged to ask questions that were answered  to his stated satisfaction and he is in agreement to proceed.  He appears to have a good understanding of his disease and is comfortable and in agreement with the stated plan.  Once we have the results of his upcoming posttreatment CT chest scan and confirm with Dr. Lorenso Courier that he is ready to proceed with treatment, we will move forward with treatment planning accordingly.    Nicholos Johns, PA-C

## 2021-11-16 NOTE — Telephone Encounter (Signed)
CALLED PATIENT TO INFORM OF APPT. WITH DR. Constance Bryant ON 11-23-21- ARRIVAL TIME- 3 PM, ADDRESS [place] SPOKE WITH PATIENT AND HE IS AWARE OF THIS APPT.

## 2021-11-17 ENCOUNTER — Other Ambulatory Visit: Payer: Self-pay | Admitting: Hematology and Oncology

## 2021-11-17 ENCOUNTER — Telehealth: Payer: Self-pay

## 2021-11-17 NOTE — Telephone Encounter (Signed)
Left voicemail in reference to gaining clarity on patient's requests to alter his appointments. I left my extension [phone] and requested that patient return my call.  Patient needs to keep his appt w/ Dr. Lorenso Courier in oncology per [facility].

## 2021-11-20 ENCOUNTER — Other Ambulatory Visit: Payer: Self-pay | Admitting: Hematology and Oncology

## 2021-11-22 ENCOUNTER — Encounter (HOSPITAL_COMMUNITY): Payer: Self-pay

## 2021-11-22 ENCOUNTER — Ambulatory Visit (HOSPITAL_COMMUNITY)
Admission: RE | Admit: 2021-11-22 | Discharge: 2021-11-22 | Disposition: A | Discharge status: Home / Self Care | Payer: Medicaid Other | Source: Ambulatory Visit | Attending: Hematology and Oncology | Admitting: Hematology and Oncology

## 2021-11-22 DIAGNOSIS — C3490 Malignant neoplasm of unspecified part of unspecified bronchus or lung: Secondary | ICD-10-CM | POA: Diagnosis not present

## 2021-11-22 MED ORDER — IOHEXOL 300 MG/ML  SOLN
100.0000 mL | Freq: Once | INTRAMUSCULAR | Status: AC | PRN
  Administered 2021-11-22: 100 mL via INTRAVENOUS

## 2021-11-22 MED ORDER — SODIUM CHLORIDE (PF) 0.9 % IJ SOLN
INTRAMUSCULAR | Status: AC
  Filled 2021-11-22: qty 50

## 2021-11-22 MED ORDER — IOHEXOL 9 MG/ML PO SOLN: 1000.0000 mL | ORAL | Status: AC

## 2021-11-23 ENCOUNTER — Other Ambulatory Visit: Payer: Self-pay

## 2021-11-23 DIAGNOSIS — K148 Other diseases of tongue: Secondary | ICD-10-CM | POA: Diagnosis not present

## 2021-11-23 DIAGNOSIS — C3432 Malignant neoplasm of lower lobe, left bronchus or lung: Secondary | ICD-10-CM

## 2021-11-23 DIAGNOSIS — C3490 Malignant neoplasm of unspecified part of unspecified bronchus or lung: Secondary | ICD-10-CM | POA: Diagnosis not present

## 2021-11-23 DIAGNOSIS — R69 Illness, unspecified: Secondary | ICD-10-CM | POA: Diagnosis not present

## 2021-11-24 ENCOUNTER — Other Ambulatory Visit: Payer: Self-pay | Admitting: Urology

## 2021-11-24 ENCOUNTER — Inpatient Hospital Stay: Payer: Medicaid Other | Attending: Hematology and Oncology

## 2021-11-24 ENCOUNTER — Other Ambulatory Visit: Payer: Self-pay

## 2021-11-24 ENCOUNTER — Encounter: Payer: Self-pay | Admitting: Urology

## 2021-11-24 ENCOUNTER — Inpatient Hospital Stay (HOSPITAL_BASED_OUTPATIENT_CLINIC_OR_DEPARTMENT_OTHER): Payer: Medicaid Other | Admitting: Hematology and Oncology

## 2021-11-24 VITALS — BP 113/83 | HR 74 | Temp 97.3°F | Resp 14 | Wt 131.8 lb

## 2021-11-24 DIAGNOSIS — C3432 Malignant neoplasm of lower lobe, left bronchus or lung: Secondary | ICD-10-CM

## 2021-11-24 DIAGNOSIS — C771 Secondary and unspecified malignant neoplasm of intrathoracic lymph nodes: Secondary | ICD-10-CM | POA: Insufficient documentation

## 2021-11-24 DIAGNOSIS — K148 Other diseases of tongue: Secondary | ICD-10-CM | POA: Insufficient documentation

## 2021-11-24 DIAGNOSIS — C3490 Malignant neoplasm of unspecified part of unspecified bronchus or lung: Secondary | ICD-10-CM

## 2021-11-24 LAB — CMP (CANCER CENTER ONLY)
ALT: 10 U/L (ref 0–44)
AST: 13 U/L — ABNORMAL LOW (ref 15–41)
Albumin: 4.3 g/dL (ref 3.5–5.0)
Alkaline Phosphatase: 49 U/L (ref 38–126)
Anion gap: 3 — ABNORMAL LOW (ref 5–15)
BUN: 6 mg/dL (ref 6–20)
CO2: 30 mmol/L (ref 22–32)
Calcium: 9.3 mg/dL (ref 8.9–10.3)
Chloride: 103 mmol/L (ref 98–111)
Creatinine: 0.75 mg/dL (ref 0.61–1.24)
GFR, Estimated: >60 mL/min (ref >60)
Glucose, Bld: 103 mg/dL — ABNORMAL HIGH (ref 70–99)
Potassium: 4.3 mmol/L (ref 3.5–5.1)
Sodium: 136 mmol/L (ref 135–145)
Total Bilirubin: 0.4 mg/dL (ref 0.3–1.2)
Total Protein: 6.7 g/dL (ref 6.5–8.1)

## 2021-11-24 LAB — CBC WITH DIFFERENTIAL (CANCER CENTER ONLY)
Abs Immature Granulocytes: 0.05 10*3/uL (ref 0.00–0.07)
Basophils Absolute: 0.1 10*3/uL (ref 0.0–0.1)
Basophils Relative: 1 %
Eosinophils Absolute: 0.2 10*3/uL (ref 0.0–0.5)
Eosinophils Relative: 3 %
HCT: 38.3 % — ABNORMAL LOW (ref 39.0–52.0)
Hemoglobin: 13.8 g/dL (ref 13.0–17.0)
Immature Granulocytes: 1 %
Lymphocytes Relative: 13 %
Lymphs Abs: 0.9 10*3/uL (ref 0.7–4.0)
MCH: 35.6 pg — ABNORMAL HIGH (ref 26.0–34.0)
MCHC: 36 g/dL (ref 30.0–36.0)
MCV: 98.7 fL (ref 80.0–100.0)
Monocytes Absolute: 0.8 10*3/uL (ref 0.1–1.0)
Monocytes Relative: 11 %
Neutro Abs: 5.2 10*3/uL (ref 1.7–7.7)
Neutrophils Relative %: 71 %
Platelet Count: 226 10*3/uL (ref 150–400)
RBC: 3.88 MIL/uL — ABNORMAL LOW (ref 4.22–5.81)
RDW: 15.1 % (ref 11.5–15.5)
WBC Count: 7.3 10*3/uL (ref 4.0–10.5)
nRBC: 0 % (ref 0.0–0.2)

## 2021-11-24 LAB — TSH: TSH: 4.521 u[IU]/mL — ABNORMAL HIGH (ref 0.350–4.500)

## 2021-11-24 LAB — MAGNESIUM: Magnesium: 1.8 mg/dL (ref 1.7–2.4)

## 2021-11-24 NOTE — Progress Notes (Signed)
Per communication from Dr. Lorenso Courier, the patient;s most recent restaging imaging shows an excellent response to treatment and he is ready to proceed with PCI. The patient is in agreement to proceed as well so we will arrange for MRI brain, prior to CT SIM in anticipation of beginning his treatments in the near future.  Nicholos Johns, MMS, PA-C [place] [place]: [phone]  Fax: [phone]
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
## 2021-11-25 ENCOUNTER — Telehealth: Payer: Self-pay | Admitting: *Deleted

## 2021-11-25 ENCOUNTER — Telehealth: Payer: Self-pay | Admitting: Hematology and Oncology

## 2021-11-25 DIAGNOSIS — H903 Sensorineural hearing loss, bilateral: Secondary | ICD-10-CM | POA: Diagnosis not present

## 2021-11-25 LAB — T4: T4, Total: 6.8 ug/dL (ref 4.5–12.0)

## 2021-11-25 NOTE — Telephone Encounter (Signed)
Per 9/7 los called and spoke to pt about appointment  pt confirmed appointment

## 2021-11-25 NOTE — Telephone Encounter (Signed)
Called patient to inform of MRI for 11-30-21- arrival time- 6:30 pm @ [facility], no restrictions to test, lvm for a return call

## 2021-11-30 ENCOUNTER — Ambulatory Visit (HOSPITAL_COMMUNITY)
Admission: RE | Admit: 2021-11-30 | Discharge: 2021-11-30 | Disposition: A | Discharge status: Home / Self Care | Payer: Medicaid Other | Source: Ambulatory Visit | Attending: Urology | Admitting: Urology

## 2021-11-30 DIAGNOSIS — C3490 Malignant neoplasm of unspecified part of unspecified bronchus or lung: Secondary | ICD-10-CM | POA: Diagnosis not present

## 2021-11-30 MED ORDER — GADOBUTROL 1 MMOL/ML IV SOLN
6.0000 mL | Freq: Once | INTRAVENOUS | Status: AC | PRN
  Administered 2021-11-30: 6 mL via INTRAVENOUS

## 2021-12-07 DIAGNOSIS — Z298 Encounter for other specified prophylactic measures: Secondary | ICD-10-CM | POA: Diagnosis not present

## 2021-12-07 NOTE — Progress Notes (Signed)
  Radiation Oncology         [phone] ________________________________  Name: Dakota Bryant MRN: [id number]  Date: 12/09/2021  DOB: 1970-06-08  SIMULATION AND TREATMENT PLANNING NOTE    ICD-10-CM   1. Primary cancer of left lower lobe of lung (HCC)  C34.32       DIAGNOSIS:   51 yo man with limited stage small cell carcinoma of the left lower lung at risk for brain metastases  NARRATIVE:  The patient was brought to the [place].  Identity was confirmed.  All relevant records and images related to the planned course of therapy were reviewed.  The patient freely provided informed written consent to proceed with treatment after reviewing the details related to the planned course of therapy. The consent form was witnessed and verified by the simulation staff.  Then, the patient was set-up in a stable reproducible  supine position for radiation therapy.  CT images were obtained.  Surface markings were placed.  The CT images were loaded into the planning software.  Then the target and avoidance structures were contoured.  Treatment planning then occurred.  The radiation prescription was entered and confirmed.  Then, I designed and supervised the construction of a total of 3 medically necessary complex treatment device including a custom made thermoplastic mask used for immobilization, and two MLC collimator apertures for radiotherapy from the right and left side, with independent collimation for each to account for beam divergence.  I have requested : Isodose Plan.    PLAN:  The whole brain will be treated to 25 Gy in 10 fractions.  ________________________________  Sheral Apley Tammi Klippel, M.D.

## 2021-12-09 ENCOUNTER — Ambulatory Visit
Admission: RE | Admit: 2021-12-09 | Discharge: 2021-12-09 | Disposition: A | Discharge status: Home / Self Care | Payer: Medicaid Other | Source: Ambulatory Visit | Attending: Urology | Admitting: Urology

## 2021-12-09 DIAGNOSIS — C3432 Malignant neoplasm of lower lobe, left bronchus or lung: Secondary | ICD-10-CM | POA: Diagnosis not present

## 2021-12-09 DIAGNOSIS — Z298 Encounter for other specified prophylactic measures: Secondary | ICD-10-CM | POA: Diagnosis not present

## 2021-12-13 ENCOUNTER — Encounter: Payer: Self-pay | Admitting: Hematology and Oncology

## 2021-12-13 DIAGNOSIS — C3432 Malignant neoplasm of lower lobe, left bronchus or lung: Secondary | ICD-10-CM | POA: Diagnosis not present

## 2021-12-13 DIAGNOSIS — Z298 Encounter for other specified prophylactic measures: Secondary | ICD-10-CM | POA: Diagnosis not present

## 2021-12-19 ENCOUNTER — Other Ambulatory Visit: Payer: Self-pay

## 2021-12-19 ENCOUNTER — Ambulatory Visit
Admission: RE | Admit: 2021-12-19 | Discharge: 2021-12-19 | Disposition: A | Discharge status: Home / Self Care | Payer: 59 | Source: Ambulatory Visit | Attending: Urology | Admitting: Urology

## 2021-12-19 DIAGNOSIS — C3432 Malignant neoplasm of lower lobe, left bronchus or lung: Secondary | ICD-10-CM | POA: Diagnosis not present

## 2021-12-19 DIAGNOSIS — F17210 Nicotine dependence, cigarettes, uncomplicated: Secondary | ICD-10-CM | POA: Diagnosis not present

## 2021-12-19 DIAGNOSIS — Z510 Encounter for antineoplastic radiation therapy: Secondary | ICD-10-CM | POA: Diagnosis not present

## 2021-12-19 DIAGNOSIS — Z2989 Encounter for other specified prophylactic measures: Secondary | ICD-10-CM | POA: Diagnosis not present

## 2021-12-19 LAB — RAD ONC ARIA SESSION SUMMARY
Course Elapsed Days: 0
Plan Fractions Treated to Date: 1
Plan Prescribed Dose Per Fraction: 2.5 Gy
Plan Total Fractions Prescribed: 10
Plan Total Prescribed Dose: 25 Gy
Reference Point Dosage Given to Date: 2.5 Gy
Reference Point Session Dosage Given: 2.5 Gy
Session Number: 1

## 2021-12-20 ENCOUNTER — Ambulatory Visit
Admission: RE | Admit: 2021-12-20 | Discharge: 2021-12-20 | Disposition: A | Discharge status: Home / Self Care | Payer: 59 | Source: Ambulatory Visit | Attending: Radiation Oncology | Admitting: Radiation Oncology

## 2021-12-20 ENCOUNTER — Other Ambulatory Visit: Payer: Self-pay | Admitting: *Deleted

## 2021-12-20 ENCOUNTER — Other Ambulatory Visit: Payer: Self-pay

## 2021-12-20 ENCOUNTER — Encounter: Payer: Self-pay | Admitting: General Surgery

## 2021-12-20 ENCOUNTER — Ambulatory Visit (INDEPENDENT_AMBULATORY_CARE_PROVIDER_SITE_OTHER): Payer: Self-pay | Admitting: General Surgery

## 2021-12-20 VITALS — BP 135/90 | HR 79 | Temp 98.2°F | Resp 16 | Ht 65.0 in | Wt 132.0 lb

## 2021-12-20 DIAGNOSIS — Z933 Colostomy status: Secondary | ICD-10-CM

## 2021-12-20 DIAGNOSIS — Z9049 Acquired absence of other specified parts of digestive tract: Secondary | ICD-10-CM

## 2021-12-20 DIAGNOSIS — C3432 Malignant neoplasm of lower lobe, left bronchus or lung: Secondary | ICD-10-CM | POA: Diagnosis not present

## 2021-12-20 DIAGNOSIS — K642 Third degree hemorrhoids: Secondary | ICD-10-CM

## 2021-12-20 LAB — RAD ONC ARIA SESSION SUMMARY
Course Elapsed Days: 1
Plan Fractions Treated to Date: 2
Plan Prescribed Dose Per Fraction: 2.5 Gy
Plan Total Fractions Prescribed: 10
Plan Total Prescribed Dose: 25 Gy
Reference Point Dosage Given to Date: 5 Gy
Reference Point Session Dosage Given: 2.5 Gy
Session Number: 2

## 2021-12-20 NOTE — Progress Notes (Unsigned)
[facility]  Doing well. Ostomy care without issues. Doing radiation to his head due to risk with his type of lunch cancer. Finished up his other treatments. Maybe able to get his port out soon. Getting strong and looks overall better and happier this visit. He has complained of hemorrhoids in the past and had them for 20 years. He says they rarely act up. I noted that he had discussed this with the Oncologist in the last note.   BP (!) 135/90   Pulse 79   Temp 98.2 F (36.8 C) (Oral)   Resp 16   Ht 5\' 5"  (1.651 m)   Wt 132 lb (59.9 kg)   SpO2 95%   BMI 21.97 kg/m  Ostomy pink Hemorrhoids, 3 columns, grade III  Patient with lung cancer getting treatment. We are working him up to getting reversed. His last colonoscopy was over 10 years ago. He will need colonoscopy and look into the stump to make sure he has no other pathology before reversal.  Discussed hemorrhoidectomy before reversal too to help aid with healing and to limit stool passage by the area until after healed since he has the colostomy anyway.  Discussed timeline of finishing treatment, colonoscopy, hemorrhoidectomy hopefully to be completed this year and then early next year colostomy reversal.  Getting him referred to GI and then will see him back. Told him to call us if he had not heard anything in 2 weeks.   Future Appointments  Date Time Provider Department Center  12/22/2021  2:45 PM [facility] 3 [facility] None  12/23/2021  2:45 PM [facility] 3 [facility] None  12/23/2021  3:00 PM [facility] None  12/26/2021  3:15 PM [facility] 3 [facility] None  12/27/2021  2:45 PM [facility] 3 [facility] None  12/28/2021  2:45 PM [facility] 3 [facility] None  12/29/2021  3:30 PM [facility] 3 [facility] None  12/30/2021  3:30 PM [facility] LIN[facility] 3 [facility] None  03/01/2022  2:30 PM [facility] LAB [facility] None  03/01/2022  3:00 PM Jaci Standard, MD [facility] None    [name], MD [facility] [address] [place]ella [place], [place] [address] [phone] (office)

## 2021-12-20 NOTE — Patient Instructions (Addendum)
Will need to get a colonoscopy first to ensure on other areas of concern before we put your colon back together. They will look into the colostomy and into your rectum.  After this we will plan for the hemorrhoid surgery and get that healed up. After this is healed usually about 8 weeks, we can get the colostomy reversed.   You will see me after the colonoscopy. If you have not heard from anyone about appointments in the next 2 weeks, call the office to see what is going on.  I would think we could get the colonoscopy and hemorrhoid surgery this year with the hemorrhoid surgery being after your PCI treatments. And then get your colostomy reversed early next year as long as oncology feels like things are still looking good with the lung cancer.

## 2021-12-21 ENCOUNTER — Ambulatory Visit
Admission: RE | Admit: 2021-12-21 | Discharge: 2021-12-21 | Disposition: A | Discharge status: Home / Self Care | Payer: 59 | Source: Ambulatory Visit | Attending: Radiation Oncology | Admitting: Radiation Oncology

## 2021-12-21 ENCOUNTER — Other Ambulatory Visit: Payer: Self-pay

## 2021-12-21 DIAGNOSIS — C3432 Malignant neoplasm of lower lobe, left bronchus or lung: Secondary | ICD-10-CM | POA: Diagnosis not present

## 2021-12-21 LAB — RAD ONC ARIA SESSION SUMMARY
Course Elapsed Days: 2
Plan Fractions Treated to Date: 3
Plan Prescribed Dose Per Fraction: 2.5 Gy
Plan Total Fractions Prescribed: 10
Plan Total Prescribed Dose: 25 Gy
Reference Point Dosage Given to Date: 7.5 Gy
Reference Point Session Dosage Given: 2.5 Gy
Session Number: 3

## 2021-12-22 ENCOUNTER — Other Ambulatory Visit: Payer: Self-pay

## 2021-12-22 ENCOUNTER — Encounter: Payer: Self-pay | Admitting: Hematology and Oncology

## 2021-12-22 ENCOUNTER — Encounter: Payer: Self-pay | Admitting: *Deleted

## 2021-12-22 ENCOUNTER — Ambulatory Visit
Admission: RE | Admit: 2021-12-22 | Discharge: 2021-12-22 | Disposition: A | Discharge status: Home / Self Care | Payer: 59 | Source: Ambulatory Visit | Attending: Radiation Oncology | Admitting: Radiation Oncology

## 2021-12-22 DIAGNOSIS — C3432 Malignant neoplasm of lower lobe, left bronchus or lung: Secondary | ICD-10-CM | POA: Diagnosis not present

## 2021-12-22 LAB — RAD ONC ARIA SESSION SUMMARY
Course Elapsed Days: 3
Plan Fractions Treated to Date: 4
Plan Prescribed Dose Per Fraction: 2.5 Gy
Plan Total Fractions Prescribed: 10
Plan Total Prescribed Dose: 25 Gy
Reference Point Dosage Given to Date: 10 Gy
Reference Point Session Dosage Given: 2.5 Gy
Session Number: 4

## 2021-12-23 ENCOUNTER — Other Ambulatory Visit: Payer: Self-pay

## 2021-12-23 ENCOUNTER — Ambulatory Visit
Admission: RE | Admit: 2021-12-23 | Discharge: 2021-12-23 | Disposition: A | Discharge status: Home / Self Care | Payer: 59 | Source: Ambulatory Visit | Attending: Radiation Oncology | Admitting: Radiation Oncology

## 2021-12-23 DIAGNOSIS — Z2989 Encounter for other specified prophylactic measures: Secondary | ICD-10-CM | POA: Diagnosis not present

## 2021-12-23 DIAGNOSIS — C3432 Malignant neoplasm of lower lobe, left bronchus or lung: Secondary | ICD-10-CM | POA: Diagnosis not present

## 2021-12-23 DIAGNOSIS — Z510 Encounter for antineoplastic radiation therapy: Secondary | ICD-10-CM | POA: Diagnosis not present

## 2021-12-23 LAB — RAD ONC ARIA SESSION SUMMARY
Course Elapsed Days: 4
Plan Fractions Treated to Date: 5
Plan Prescribed Dose Per Fraction: 2.5 Gy
Plan Total Fractions Prescribed: 10
Plan Total Prescribed Dose: 25 Gy
Reference Point Dosage Given to Date: 12.5 Gy
Reference Point Session Dosage Given: 2.5 Gy
Session Number: 5

## 2021-12-26 ENCOUNTER — Other Ambulatory Visit: Payer: Self-pay

## 2021-12-26 ENCOUNTER — Ambulatory Visit
Admission: RE | Admit: 2021-12-26 | Discharge: 2021-12-26 | Disposition: A | Discharge status: Home / Self Care | Payer: 59 | Source: Ambulatory Visit | Attending: Radiation Oncology | Admitting: Radiation Oncology

## 2021-12-26 DIAGNOSIS — C3432 Malignant neoplasm of lower lobe, left bronchus or lung: Secondary | ICD-10-CM | POA: Diagnosis not present

## 2021-12-26 LAB — RAD ONC ARIA SESSION SUMMARY
Course Elapsed Days: 7
Plan Fractions Treated to Date: 6
Plan Prescribed Dose Per Fraction: 2.5 Gy
Plan Total Fractions Prescribed: 10
Plan Total Prescribed Dose: 25 Gy
Reference Point Dosage Given to Date: 15 Gy
Reference Point Session Dosage Given: 2.5 Gy
Session Number: 6

## 2021-12-27 ENCOUNTER — Other Ambulatory Visit: Payer: Self-pay

## 2021-12-27 ENCOUNTER — Ambulatory Visit
Admission: RE | Admit: 2021-12-27 | Discharge: 2021-12-27 | Disposition: A | Discharge status: Home / Self Care | Payer: 59 | Source: Ambulatory Visit | Attending: Radiation Oncology | Admitting: Radiation Oncology

## 2021-12-27 DIAGNOSIS — C3432 Malignant neoplasm of lower lobe, left bronchus or lung: Secondary | ICD-10-CM | POA: Diagnosis not present

## 2021-12-27 LAB — RAD ONC ARIA SESSION SUMMARY
Course Elapsed Days: 8
Plan Fractions Treated to Date: 7
Plan Prescribed Dose Per Fraction: 2.5 Gy
Plan Total Fractions Prescribed: 10
Plan Total Prescribed Dose: 25 Gy
Reference Point Dosage Given to Date: 17.5 Gy
Reference Point Session Dosage Given: 2.5 Gy
Session Number: 7

## 2021-12-28 ENCOUNTER — Other Ambulatory Visit: Payer: Self-pay

## 2021-12-28 ENCOUNTER — Ambulatory Visit
Admission: RE | Admit: 2021-12-28 | Discharge: 2021-12-28 | Disposition: A | Discharge status: Home / Self Care | Payer: 59 | Source: Ambulatory Visit | Attending: Radiation Oncology | Admitting: Radiation Oncology

## 2021-12-28 DIAGNOSIS — C3432 Malignant neoplasm of lower lobe, left bronchus or lung: Secondary | ICD-10-CM | POA: Diagnosis not present

## 2021-12-28 LAB — RAD ONC ARIA SESSION SUMMARY
Course Elapsed Days: 9
Plan Fractions Treated to Date: 8
Plan Prescribed Dose Per Fraction: 2.5 Gy
Plan Total Fractions Prescribed: 10
Plan Total Prescribed Dose: 25 Gy
Reference Point Dosage Given to Date: 20 Gy
Reference Point Session Dosage Given: 2.5 Gy
Session Number: 8

## 2021-12-29 ENCOUNTER — Other Ambulatory Visit: Payer: Self-pay

## 2021-12-29 ENCOUNTER — Ambulatory Visit
Admission: RE | Admit: 2021-12-29 | Discharge: 2021-12-29 | Disposition: A | Discharge status: Home / Self Care | Payer: 59 | Source: Ambulatory Visit | Attending: Radiation Oncology | Admitting: Radiation Oncology

## 2021-12-29 DIAGNOSIS — C3432 Malignant neoplasm of lower lobe, left bronchus or lung: Secondary | ICD-10-CM | POA: Diagnosis not present

## 2021-12-29 LAB — RAD ONC ARIA SESSION SUMMARY
Course Elapsed Days: 10
Plan Fractions Treated to Date: 9
Plan Prescribed Dose Per Fraction: 2.5 Gy
Plan Total Fractions Prescribed: 10
Plan Total Prescribed Dose: 25 Gy
Reference Point Dosage Given to Date: 22.5 Gy
Reference Point Session Dosage Given: 2.5 Gy
Session Number: 9

## 2021-12-30 ENCOUNTER — Ambulatory Visit
Admission: RE | Admit: 2021-12-30 | Discharge: 2021-12-30 | Disposition: A | Discharge status: Home / Self Care | Payer: 59 | Source: Ambulatory Visit | Attending: Radiation Oncology | Admitting: Radiation Oncology

## 2021-12-30 ENCOUNTER — Other Ambulatory Visit: Payer: Self-pay

## 2021-12-30 ENCOUNTER — Encounter: Payer: Self-pay | Admitting: Urology

## 2021-12-30 DIAGNOSIS — C3432 Malignant neoplasm of lower lobe, left bronchus or lung: Secondary | ICD-10-CM | POA: Diagnosis not present

## 2021-12-30 DIAGNOSIS — Z2989 Encounter for other specified prophylactic measures: Secondary | ICD-10-CM | POA: Diagnosis not present

## 2021-12-30 DIAGNOSIS — Z510 Encounter for antineoplastic radiation therapy: Secondary | ICD-10-CM | POA: Diagnosis not present

## 2021-12-30 DIAGNOSIS — C3412 Malignant neoplasm of upper lobe, left bronchus or lung: Secondary | ICD-10-CM

## 2021-12-30 LAB — RAD ONC ARIA SESSION SUMMARY
Course Elapsed Days: 11
Plan Fractions Treated to Date: 10
Plan Prescribed Dose Per Fraction: 2.5 Gy
Plan Total Fractions Prescribed: 10
Plan Total Prescribed Dose: 25 Gy
Reference Point Dosage Given to Date: 25 Gy
Reference Point Session Dosage Given: 2.5 Gy
Session Number: 10

## 2022-01-03 ENCOUNTER — Telehealth: Payer: Self-pay

## 2022-01-03 ENCOUNTER — Other Ambulatory Visit: Payer: Self-pay | Admitting: *Deleted

## 2022-01-03 ENCOUNTER — Ambulatory Visit: Payer: Medicaid Other | Admitting: Internal Medicine

## 2022-01-03 MED ORDER — AMLODIPINE BESYLATE 10 MG PO TABS: 10.0000 mg | ORAL_TABLET | Freq: Every day | ORAL | 1 refills | Status: DC

## 2022-01-03 MED ORDER — TAMSULOSIN HCL 0.4 MG PO CAPS: 0.4000 mg | ORAL_CAPSULE | Freq: Every day | ORAL | 2 refills | Status: AC

## 2022-01-03 MED ORDER — CETIRIZINE HCL 10 MG PO TABS: 10.0000 mg | ORAL_TABLET | Freq: Every day | ORAL | 0 refills | Status: DC

## 2022-01-03 NOTE — Telephone Encounter (Signed)
RN called patient after identity confirmed discussed concerns about possible skin irritation to scalp and ears from radiation.  Patient was asked if he was able to send in picture per Dr. Tammi Klippel to prevent trip to clinic and to get an idea on what skin looks like to treat.  Patient doesn't have capability to take picture and send in to the office and didn't want to come into the clinic at this time.  RN expressed the need to come in since unable to get a picture he declined.

## 2022-01-03 NOTE — Telephone Encounter (Signed)
RN called patient back with instructions per Dr. Tammi Klippel to clean site and apply OTC neosporin twice a day to prevent infection of the area.  He said he was using dermoplast to area but will get neosporin as advised.  Verbalized understanding and said thanks for calling back.

## 2022-01-11 ENCOUNTER — Other Ambulatory Visit (INDEPENDENT_AMBULATORY_CARE_PROVIDER_SITE_OTHER): Payer: Self-pay

## 2022-01-11 ENCOUNTER — Encounter (INDEPENDENT_AMBULATORY_CARE_PROVIDER_SITE_OTHER): Payer: Self-pay

## 2022-01-11 ENCOUNTER — Encounter: Payer: Self-pay | Admitting: Gastroenterology

## 2022-01-11 ENCOUNTER — Encounter: Payer: Self-pay | Admitting: Hematology and Oncology

## 2022-01-11 ENCOUNTER — Ambulatory Visit (INDEPENDENT_AMBULATORY_CARE_PROVIDER_SITE_OTHER): Payer: Medicaid Other | Admitting: Gastroenterology

## 2022-01-11 ENCOUNTER — Telehealth (INDEPENDENT_AMBULATORY_CARE_PROVIDER_SITE_OTHER): Payer: Self-pay

## 2022-01-11 VITALS — BP 124/80 | HR 81 | Temp 97.9°F | Ht 66.0 in | Wt 133.8 lb

## 2022-01-11 DIAGNOSIS — K219 Gastro-esophageal reflux disease without esophagitis: Secondary | ICD-10-CM | POA: Diagnosis not present

## 2022-01-11 DIAGNOSIS — Z933 Colostomy status: Secondary | ICD-10-CM

## 2022-01-11 DIAGNOSIS — Z1211 Encounter for screening for malignant neoplasm of colon: Secondary | ICD-10-CM | POA: Diagnosis not present

## 2022-01-11 DIAGNOSIS — R1310 Dysphagia, unspecified: Secondary | ICD-10-CM

## 2022-01-11 DIAGNOSIS — Z9049 Acquired absence of other specified parts of digestive tract: Secondary | ICD-10-CM

## 2022-01-11 MED ORDER — PEG 3350-KCL-NA BICARB-NACL 420 G PO SOLR: 4000.0000 mL | ORAL | 0 refills | Status: DC

## 2022-01-11 NOTE — Patient Instructions (Signed)
Continue omeprazole 40mg  daily before breakfast. Take Linzess 69mcg daily for constipation starting at least for one week before your colonoscopy. We have given you samples. You can take 1 or 2 capsules daily as needed to maintain regular soft stools.  We are not able to complete your colonoscopy tomorrow due to booked schedule. We will schedule you for a colonoscopy and upper endoscopy with Dr. Jenetta Downer in the near future. See separate instructions.

## 2022-01-11 NOTE — Telephone Encounter (Signed)
Naksh Radi Ann Eloyse Causey, CMA  ?

## 2022-01-11 NOTE — Progress Notes (Signed)
GI Office Note    Referring Provider: Virl Cagey, MD Primary Care Physician:  Pcp, No  Primary Gastroenterologist: Dr. Jenetta Downer  Chief Complaint   Chief Complaint  Patient presents with   Colonoscopy    Here for colonoscopy and EGD    History of Present Illness   Dakota Bryant is a 51 y.o. male presenting today at the request of Dr. Constance Haw to schedule colonoscopy prior to colostomy reversal.  Patient last seen in May 2023.  He has a history of acid reflux, peptic ulcer disease, complicated diverticulitis.  Patient required sigmoid colectomy, end colostomy in March 2023.  At time of last office visit he was scheduled for EGD and colonoscopy.  Needing colonoscopy prior to ostomy takedown.  Last colonoscopy approximately 10 years prior.  EGD planned for dysphagia, GERD, diminished appetite. Procedures were postponed due to new diagnosis of small cell lung cancer.   Patient undergoing treatment for small cell lung cancer.  He has completed 4 cycles of chemotherapy and prophylactic whole brain radiation.  Seeing ENT for tongue lesion with plans for excisional biopsy 01/25/22.   Today: he has had chronic lesion on the tongue, right lateral aspect. While on chemo, lesion increased in size and became painful. Excisional biopsy planned. Overall his heartburn is better. He has changed his diet since cancer treatment. Taste buds different. Quit drinking [place] and less fast food. He is having issues with pills getting hung in back of throat. Also notes the last couple of bites of a meal seems stuck in his throat/neck region and he coughs it up. No abdominal pain. Chronically deals with constipation. If nothing in his ostomy bag after four days, he will take stool softener or MOM. Has never tried Miralax or daily regimen. No melena, blood in the stool. Passes some mucous per rectum.   Patient reports that he drank his bowel prep (that he was given for his 08/2021 procedure that  was cancelled) last night and has been on clear liquids since Monday evening. He thought he was having his colonoscopy today. We tried to accommodate having his procedure added on to tomorrow but there were no openings.   CT chest/abdomen/pelvis with contrast November 22, 2021: IMPRESSION: 1. Response to therapy of left lower lobe primary and thoracic nodal metastasis. No new or progressive disease. 2. The right upper lobe pulmonary nodule on prior PET has resolved. A 4 mm more peripheral right upper lobe pulmonary nodule is unchanged. 3. Descending colostomy, without acute complication. 4. Age advanced coronary artery atherosclerosis. Recommend assessment of coronary risk factors. 5. Aortic atherosclerosis (ICD10-I70.0) and emphysema (ICD10-J43.9). 6. Left nephrolithiasis  Medications   Current Outpatient Medications  Medication Sig Dispense Refill   acetaminophen (TYLENOL) 500 MG tablet Take 1,000 mg by mouth every 8 (eight) hours as needed for headache or moderate pain.     amLODipine (NORVASC) 10 MG tablet Take 1 tablet (10 mg total) by mouth daily. 30 tablet 1   cetirizine (ZYRTEC) 10 MG tablet Take 1 tablet (10 mg total) by mouth daily. 30 tablet 0   labetalol (NORMODYNE) 200 MG tablet Take 1 tablet (200 mg total) by mouth 2 (two) times daily. 60 tablet 3   Multiple Vitamin (MULTIVITAMIN WITH MINERALS) TABS tablet Take 1 tablet by mouth daily. 120 tablet 3   naphazoline-pheniramine (VISINE) 0.025-0.3 % ophthalmic solution Place 1 drop into both eyes 4 (four) times daily as needed for eye irritation.     ondansetron (ZOFRAN) 8  MG tablet Take 1 tablet (8 mg total) by mouth every 8 (eight) hours as needed. 30 tablet 0   oxymetazoline (AFRIN) 0.05 % nasal spray Place 1 spray into both nostrils See admin instructions. Instill 1 spray in each nostril once daily, may use a second time as needed for congestion     senna-docusate (SENNA S) 8.6-50 MG tablet Take 1 tablet by mouth daily. May  increase to 2 daily at bedtime as needed     tamsulosin (FLOMAX) 0.4 MG CAPS capsule Take 1 capsule (0.4 mg total) by mouth daily. 30 capsule 2   traZODone (DESYREL) 50 MG tablet Take 1 tablet (50 mg total) by mouth at bedtime. 30 tablet 4   omeprazole (PRILOSEC) 40 MG capsule Take 40 mg by mouth daily.     No current facility-administered medications for this visit.    Allergies   Allergies as of 01/11/2022 - Review Complete 01/11/2022  Allergen Reaction Noted   Penicillins Other (See Comments) 06/26/2015     Past Medical History   Past Medical History:  Diagnosis Date   Acid reflux    Colostomy in place [facility])    Emphysema lung ([place])    Hypertension    Mass of left lung    Perforated diverticulum    Small cell lung cancer [facility])     Past Surgical History   Past Surgical History:  Procedure Laterality Date   BRONCHIAL BRUSHINGS  07/26/2021   Procedure: BRONCHIAL BRUSHINGS;  Surgeon: Margaretha Seeds, MD;  Location: [facility];  Service: Cardiopulmonary;;   BRONCHIAL WASHINGS  07/26/2021   Procedure: BRONCHIAL WASHINGS;  Surgeon: Margaretha Seeds, MD;  Location: [facility];  Service: Cardiopulmonary;;   COLOSTOMY N/A 06/03/2021   Procedure: SIGMOID COLECTOMY WITH COLOSTOMY;  Surgeon: Virl Cagey, MD;  Location: [facility];  Service: General;  Laterality: N/A;   ENDOBRONCHIAL ULTRASOUND Bilateral 07/26/2021   Procedure: ENDOBRONCHIAL ULTRASOUND;  Surgeon: Margaretha Seeds, MD;  Location: [facility];  Service: Cardiopulmonary;  Laterality: Bilateral;   FINE NEEDLE ASPIRATION  07/26/2021   Procedure: FINE NEEDLE ASPIRATION;  Surgeon: Margaretha Seeds, MD;  Location: [facility];  Service: Cardiopulmonary;;   HAND TENDON SURGERY     IR IMAGING GUIDED PORT INSERTION  08/10/2021   KNEE ARTHROSCOPY Left    VIDEO BRONCHOSCOPY  07/26/2021   Procedure: VIDEO BRONCHOSCOPY WITHOUT FLUORO;  Surgeon: Margaretha Seeds, MD;  Location: [facility];  Service: Cardiopulmonary;;     Past Family History   History reviewed. No pertinent family history.  Past Social History   Social History   Socioeconomic History   Marital status: Divorced    Spouse name: Not on file   Number of children: Not on file   Years of education: Not on file   Highest education level: Not on file  Occupational History   Not on file  Tobacco Use   Smoking status: Every Day    Packs/day: 1.50    Years: 39.00    Total pack years: 58.50    Types: Cigarettes    Start date: 1983    Passive exposure: Current   Smokeless tobacco: Never   Tobacco comments:    Currently smoking 1.5ppd as of 10/24/21 ep  Vaping Use   Vaping Use: Never used  Substance and Sexual Activity   Alcohol use: Yes    Alcohol/week: 2.0 standard drinks of alcohol    Types: 2 Cans of beer per week    Comment: occasional   Drug use:  Yes    Types: Marijuana    Comment: occ   Sexual activity: Yes  Other Topics Concern   Not on file  Social History Narrative   Not on file   Social Determinants of Health   Financial Resource Strain: High Risk (09/13/2021)   Overall Financial Resource Strain (CARDIA)    Difficulty of Paying Living Expenses: Hard  Food Insecurity: Not on file  Transportation Needs: Not on file  Physical Activity: Not on file  Stress: Not on file  Social Connections: Not on file  Intimate Partner Violence: Not on file    Review of Systems   General: Negative for anorexia, weight loss, fever, chills, fatigue, weakness. ENT: Negative for hoarseness, difficulty swallowing , nasal congestion. CV: Negative for chest pain, angina, palpitations, dyspnea on exertion, peripheral edema.  Respiratory: Negative for dyspnea at rest, dyspnea on exertion, cough, sputum, wheezing.  GI: See history of present illness. GU:  Negative for dysuria, hematuria, urinary incontinence, urinary frequency, nocturnal urination.  Endo: Negative for unusual weight change.     Physical Exam   BP 124/80 (BP  Location: Right Arm, Patient Position: Sitting, Cuff Size: Normal)   Pulse 81   Temp 97.9 F (36.6 C) (Oral)   Ht 5\' 6"  (1.676 m)   Wt 133 lb 12.8 oz (60.7 kg)   SpO2 98%   BMI 21.60 kg/m    General: Thin male in no acute distress. Appears somewhat older than stated age.    Eyes: No icterus. Mouth: Oropharyngeal mucosa moist and pink , no lesions erythema or exudate. Lungs: Clear to auscultation bilaterally.  Heart: Regular rate and rhythm, no murmurs rubs or gallops.  Abdomen: Bowel sounds are normal, nontender, nondistended, no hepatosplenomegaly or masses,  no abdominal bruits or hernia , no rebound or guarding. Ostomy in left abdomen, watery yellow stool present. Rectal: not performed Extremities: No lower extremity edema. No clubbing or deformities. Neuro: Alert and oriented x 4   Skin: Warm and dry, no jaundice.   Psych: Alert and cooperative, normal mood and affect.  Labs   Lab Results  Component Value Date   TSH 4.521 (H) 11/24/2021   Lab Results  Component Value Date   WBC 7.3 11/24/2021   HGB 13.8 11/24/2021   HCT 38.3 (L) 11/24/2021   MCV 98.7 11/24/2021   PLT 226 11/24/2021   Lab Results  Component Value Date   CREATININE 0.75 11/24/2021   BUN 6 11/24/2021   NA 136 11/24/2021   K 4.3 11/24/2021   CL 103 11/24/2021   CO2 30 11/24/2021   Lab Results  Component Value Date   ALT 10 11/24/2021   AST 13 (L) 11/24/2021   ALKPHOS 49 11/24/2021   BILITOT 0.4 11/24/2021    Imaging Studies   No results found.  Assessment   Colon cancer screening: colonoscopy needed prior to reversal of colostomy. Last colonoscopy approximately 10 years ago.  GERD/dysphagia: heartburn better with dietary changes and switch to omeprazole. Complains of pill and solid food dysphagia which is ongoing. Recommend EGD to evaluate for esophageal ring/web/stricture.     PLAN   Colonoscopy/EGD/ED with Dr. Jenetta Downer. ASA 3.  I have discussed the risks, alternatives, benefits  with regards to but not limited to the risk of reaction to medication, bleeding, infection, perforation and the patient is agreeable to proceed. Written consent to be obtained. Continue omeprazole 40mg  daily before breakfast.  Samples of Linzess provided to start at least one week prior to colonoscopy to help  with constipation.     Laureen Ochs. Bobby Rumpf, [place], PA-C [facility]  I have reviewed the note and agree with the APP's assessment as described in this progress note  Maylon Peppers, MD Gastroenterology and Hepatology [facility]

## 2022-01-13 ENCOUNTER — Encounter: Payer: Self-pay | Admitting: Hematology and Oncology

## 2022-01-13 NOTE — Progress Notes (Signed)
                                                                                                                                                             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  He reported constant headaches and having moments when he feels like he is in space and forgets things. Also reported fatigue and poor appetite but no N/V.  He had mild skin irritation with erythema on ears managed with Sonafine. He also reported having a loss of high and low frequency hearing and some blurry vision.  Plan: The patient will receive a call in about one month from the radiation oncology department. He will continue follow up with Dr. Lorenso Courier as well.    Nicholos Johns, MMS, PA-C [facility] [place]: [phone]  Fax: [phone]

## 2022-01-20 NOTE — H&P (Signed)
HPI:  Dakota Bryant is a 51 y.o. male who presents as a consult Patient.  Referring Provider: Pcp, Verify Patient Has  Chief complaint: Tongue growth.  HPI: He has had a growth on his tongue for about 10 years. Since being treated with chemotherapy for lung cancer he felt that the growth got a little bit larger and is now starting to hurt some. He is very sensitive to heat and cold. He continues to smoke, 1 pack/day. He has recent imaging that is going to be reviewed to assess his response to treatment. He also complains of hearing loss. The hearing got worse after treatment.  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  Lung disease  Past Surgical History: Procedure Laterality Date  ARTERIOGRAM Left 12/14/2020 Procedure: ARTERIOGRAM, RIGHT UPPER EXTREMITY; Surgeon: Judithann Sheen, MD; Location[facility] MA[facility] OR; Service: Vascular; Laterality: Left;  [place] / ULNA Right 01/06/2021 Procedure: REPAIR BLOOD VESSEL W/ VEIN GRAFT - LOWER EXTREMITY; Surgeon: Jonette Eva, MD; Location: [place]; Service: Orthopedics; Laterality: Right;  REPAIR PENILE INJURY  SYMPATHECTOMY Right 01/06/2021 Procedure: SYMPATHECTOMY PERIARTERIAL; Surgeon: Jonette Eva, MD; Location: [place]; Service: Orthopedics; Laterality: Right;  No family history of bleeding disorders, wound healing problems or difficulty with anesthesia.  Social History  Socioeconomic History  Marital status: Divorced Spouse name: Not on file  Number of children: Not on file  Years of education: Not on file  Highest education level: Not on file Occupational History  Not on file Tobacco Use  Smoking status: Every Day Packs/day: 1.00 Years: 41.00 Additional pack years: 0.00 Total pack years: 41.00 Types: Cigarettes   Smokeless tobacco: Never Substance and Sexual Activity  Alcohol use: Yes Alcohol/week: 21.0 standard drinks of alcohol Types: 21 Cans of beer per week  Drug use: Yes Types: Marijuana  Sexual activity: Not on file Other Topics Concern  Not on file Social History Narrative  Not on file  Social Determinants of Health  Financial Resource Strain: Not on file Food Insecurity: Not on file Transportation Needs: Not on file Physical Activity: Not on file Stress: Not on file Social Connections: Not on file Housing Stability: Not on file  Current Outpatient Medications:  alprazolam (XANAX ORAL), Take by mouth., Disp: , Rfl:  esomeprazole (NEXIUM, NEXIUM 24HR) 20 MG capsule, Take 1 capsule (20 mg total) by mouth daily before breakfast., Disp: , Rfl:  gabapentin (NEURONTIN) 400 MG capsule, Take 1 capsule (400 mg total) by mouth 3 times daily., Disp: 260 capsule, Rfl: 3  HYDROcodone-acetaminophen (NORCO) 5-325 mg per tablet, Take 1 tablet by mouth every 6 (six) hours as needed., Disp: 20 tablet, Rfl: 0  lidocaine-prilocaine (EMLA) 2.5-2.5 % cream, Apply to fingers as needed for pain, Disp: 30 g, Rfl: 1  nicotine (NICODERM CQ) 14 mg/24 hr, Place 1 patch onto the skin daily., Disp: 28 patch, Rfl: 0  nitroglycerin (NITRO-BID) 2 % ointment, Place 0.5 inches onto the skin 3 times daily., Disp: 30 g, Rfl: 1  oxymetazoline (AFRIN) 0.05 % nasal spray, Administer 2 sprays into affected nostril as needed for Congestion., Disp: 30 mL, Rfl: 0  tiZANidine (ZANAFLEX) 2 MG tablet, Take 1 tablet (2 mg total) by mouth 3 times daily., Disp: 30 tablet, Rfl: 0  traMADoL (ULTRAM) 50 mg tablet, Take 1 tablet (50 mg total) by mouth every 6 (six) hours as needed., Disp:  20 tablet, Rfl: 0  amLODIPine (NORVASC) 5 MG tablet, Take 1 tablet (5 mg total) by mouth daily for 30 days., Disp: 30 tablet, Rfl: 0  aspirin 325 MG EC tablet *ANTIPLATELET*, Take 1 tablet (325 mg total) by mouth daily for 30 days., Disp: 30 tablet,  Rfl: 0  clopidogreL (PLAVIX) 75 mg tablet *ANTIPLATELET*, Take 1 tablet (75 mg total) by mouth daily. Start date: 01/09/21 Stop date: 02/20/21, Disp: 44 tablet, Rfl: 0  A complete ROS was performed with pertinent positives/negatives noted in the HPI. The remainder of the ROS are negative.   Physical Exam:  Temp 97.8 F (36.6 C)  Ht 1.676 m (5\' 6" )  Wt 59.9 kg (132 lb)  BMI 21.31 kg/m  General: Healthy and alert, in no distress, breathing easily. Normal affect. In a pleasant mood. Head: Normocephalic, atraumatic. No masses, or scars. Eyes: Pupils are equal, and reactive to light. Vision is grossly intact. No spontaneous or gaze nystagmus. Ears: Ear canals are clear. Tympanic membranes are intact, with normal landmarks and the middle ears are clear and healthy. Hearing: Grossly normal. Nose: Nasal cavities are clear with healthy mucosa, no polyps or exudate. Airways are patent. Face: No masses or scars, facial nerve function is symmetric. Oral Cavity: 7 or 8 mm raised papillary lesion right anterior dorsal tongue, otherwise no mucosal abnormalities are noted. Tongue with normal mobility. Oropharynx: Tonsils are symmetric. There are no mucosal masses identified. Tongue base appears normal and healthy. Larynx/Hypopharynx: indirect exam reveals healthy, mobile vocal cords, without mucosal lesions in the hypopharynx or larynx. Chest: Deferred Neck: No palpable masses, no cervical adenopathy, no thyroid nodules or enlargement. Neuro: Cranial nerves II-XII with normal function. Balance: Normal gate. Other findings: none.  Independent Review of Additional Tests or Records: none  Procedures: none  Impression & Plans: Complaint of hearing loss. Recommend audiometric evaluation.  Tongue lesion that has been present for many years. Given his heavy smoking history there is a chance this could be malignant. Recommend consideration for excisional biopsy. That may have to be done in the  operating room under anesthesia. We will await the results of his recent imaging and then will schedule.  Recommend he try to stop smoking.

## 2022-01-23 ENCOUNTER — Encounter: Payer: Self-pay | Admitting: Hematology and Oncology

## 2022-01-24 ENCOUNTER — Encounter (HOSPITAL_COMMUNITY): Payer: Self-pay | Admitting: Otolaryngology

## 2022-01-24 ENCOUNTER — Other Ambulatory Visit: Payer: Self-pay

## 2022-01-24 NOTE — Progress Notes (Signed)
Spoke with pt for pre-op call. Pt denies cardiac history or Diabetes. Pt is treated for HTN.   Shower instructions given to pt and he voiced understanding.

## 2022-01-25 ENCOUNTER — Ambulatory Visit (HOSPITAL_BASED_OUTPATIENT_CLINIC_OR_DEPARTMENT_OTHER): Payer: 59 | Admitting: Certified Registered Nurse Anesthetist

## 2022-01-25 ENCOUNTER — Other Ambulatory Visit: Payer: Self-pay

## 2022-01-25 ENCOUNTER — Other Ambulatory Visit: Payer: Self-pay | Admitting: Hematology and Oncology

## 2022-01-25 ENCOUNTER — Ambulatory Visit (HOSPITAL_COMMUNITY)
Admission: RE | Admit: 2022-01-25 | Discharge: 2022-01-25 | Disposition: A | Discharge status: Home / Self Care | Payer: 59 | Attending: Otolaryngology | Admitting: Otolaryngology

## 2022-01-25 ENCOUNTER — Ambulatory Visit (HOSPITAL_COMMUNITY): Payer: 59 | Admitting: Certified Registered Nurse Anesthetist

## 2022-01-25 ENCOUNTER — Encounter (HOSPITAL_COMMUNITY)
Admission: RE | Disposition: A | Discharge status: Home / Self Care | Payer: Self-pay | Source: Home / Self Care | Attending: Otolaryngology

## 2022-01-25 ENCOUNTER — Encounter (HOSPITAL_COMMUNITY): Payer: Self-pay | Admitting: Otolaryngology

## 2022-01-25 DIAGNOSIS — K219 Gastro-esophageal reflux disease without esophagitis: Secondary | ICD-10-CM | POA: Insufficient documentation

## 2022-01-25 DIAGNOSIS — Z79899 Other long term (current) drug therapy: Secondary | ICD-10-CM | POA: Diagnosis not present

## 2022-01-25 DIAGNOSIS — J449 Chronic obstructive pulmonary disease, unspecified: Secondary | ICD-10-CM

## 2022-01-25 DIAGNOSIS — M1990 Unspecified osteoarthritis, unspecified site: Secondary | ICD-10-CM | POA: Diagnosis not present

## 2022-01-25 DIAGNOSIS — I10 Essential (primary) hypertension: Secondary | ICD-10-CM | POA: Diagnosis not present

## 2022-01-25 DIAGNOSIS — C3490 Malignant neoplasm of unspecified part of unspecified bronchus or lung: Secondary | ICD-10-CM | POA: Diagnosis not present

## 2022-01-25 DIAGNOSIS — K149 Disease of tongue, unspecified: Secondary | ICD-10-CM | POA: Insufficient documentation

## 2022-01-25 DIAGNOSIS — I739 Peripheral vascular disease, unspecified: Secondary | ICD-10-CM | POA: Insufficient documentation

## 2022-01-25 DIAGNOSIS — J439 Emphysema, unspecified: Secondary | ICD-10-CM | POA: Diagnosis not present

## 2022-01-25 DIAGNOSIS — R69 Illness, unspecified: Secondary | ICD-10-CM | POA: Diagnosis not present

## 2022-01-25 DIAGNOSIS — K148 Other diseases of tongue: Secondary | ICD-10-CM | POA: Diagnosis not present

## 2022-01-25 DIAGNOSIS — F17210 Nicotine dependence, cigarettes, uncomplicated: Secondary | ICD-10-CM

## 2022-01-25 DIAGNOSIS — K1329 Other disturbances of oral epithelium, including tongue: Secondary | ICD-10-CM

## 2022-01-25 HISTORY — DX: Depression, unspecified: F32.A

## 2022-01-25 HISTORY — PX: EXCISION OF TONGUE LESION: SHX6434

## 2022-01-25 HISTORY — DX: Nausea with vomiting, unspecified: R11.2

## 2022-01-25 HISTORY — DX: Other specified postprocedural states: Z98.890

## 2022-01-25 HISTORY — DX: Unspecified osteoarthritis, unspecified site: M19.90

## 2022-01-25 HISTORY — DX: Headache, unspecified: R51.9

## 2022-01-25 LAB — CBC
HCT: 41.7 % (ref 39.0–52.0)
Hemoglobin: 15.1 g/dL (ref 13.0–17.0)
MCH: 33.6 pg (ref 26.0–34.0)
MCHC: 36.2 g/dL — ABNORMAL HIGH (ref 30.0–36.0)
MCV: 92.9 fL (ref 80.0–100.0)
Platelets: 255 10*3/uL (ref 150–400)
RBC: 4.49 MIL/uL (ref 4.22–5.81)
RDW: 11.5 % (ref 11.5–15.5)
WBC: 7.6 10*3/uL (ref 4.0–10.5)
nRBC: 0 % (ref 0.0–0.2)

## 2022-01-25 LAB — BASIC METABOLIC PANEL
Anion gap: 10 (ref 5–15)
BUN: 7 mg/dL (ref 6–20)
CO2: 23 mmol/L (ref 22–32)
Calcium: 9.2 mg/dL (ref 8.9–10.3)
Chloride: 90 mmol/L — ABNORMAL LOW (ref 98–111)
Creatinine, Ser: 0.73 mg/dL (ref 0.61–1.24)
GFR, Estimated: >60 mL/min (ref >60)
Glucose, Bld: 97 mg/dL (ref 70–99)
Potassium: 3.7 mmol/L (ref 3.5–5.1)
Sodium: 123 mmol/L — ABNORMAL LOW (ref 135–145)

## 2022-01-25 SURGERY — EXCISION, LESION, TONGUE
Anesthesia: General | Site: Mouth

## 2022-01-25 MED ORDER — ONDANSETRON 4 MG PO TBDP: 4.0000 mg | ORAL_TABLET | Freq: Three times a day (TID) | ORAL | 0 refills | Status: DC | PRN

## 2022-01-25 MED ORDER — PROPOFOL 10 MG/ML IV BOLUS
INTRAVENOUS | Status: DC | PRN
  Administered 2022-01-25: 150 mg via INTRAVENOUS

## 2022-01-25 MED ORDER — PHENYLEPHRINE 80 MCG/ML (10ML) SYRINGE FOR IV PUSH (FOR BLOOD PRESSURE SUPPORT)
PREFILLED_SYRINGE | INTRAVENOUS | Status: DC | PRN
  Administered 2022-01-25 (×2): 80 ug via INTRAVENOUS
  Administered 2022-01-25: 160 ug via INTRAVENOUS

## 2022-01-25 MED ORDER — ACETAMINOPHEN 500 MG PO TABS
1000.0000 mg | ORAL_TABLET | Freq: Once | ORAL | Status: AC
  Administered 2022-01-25: 1000 mg via ORAL
  Filled 2022-01-25: qty 2

## 2022-01-25 MED ORDER — PHENYLEPHRINE HCL-NACL 20-0.9 MG/250ML-% IV SOLN
INTRAVENOUS | Status: DC | PRN
  Administered 2022-01-25: 25 ug/min via INTRAVENOUS

## 2022-01-25 MED ORDER — LIDOCAINE-EPINEPHRINE 1 %-1:100000 IJ SOLN
INTRAMUSCULAR | Status: AC
  Filled 2022-01-25: qty 1

## 2022-01-25 MED ORDER — PROPOFOL 10 MG/ML IV BOLUS
INTRAVENOUS | Status: AC
  Filled 2022-01-25: qty 20

## 2022-01-25 MED ORDER — BACITRACIN ZINC 500 UNIT/GM EX OINT
TOPICAL_OINTMENT | CUTANEOUS | Status: AC
  Filled 2022-01-25: qty 28.35

## 2022-01-25 MED ORDER — HYDROMORPHONE HCL 1 MG/ML IJ SOLN: 0.2500 mg | INTRAMUSCULAR | Status: DC | PRN

## 2022-01-25 MED ORDER — CHLORHEXIDINE GLUCONATE 0.12 % MT SOLN
OROMUCOSAL | Status: AC
  Administered 2022-01-25: 15 mL via OROMUCOSAL
  Filled 2022-01-25: qty 15

## 2022-01-25 MED ORDER — 0.9 % SODIUM CHLORIDE (POUR BTL) OPTIME
TOPICAL | Status: DC | PRN
  Administered 2022-01-25: 1000 mL

## 2022-01-25 MED ORDER — ROCURONIUM BROMIDE 10 MG/ML (PF) SYRINGE
PREFILLED_SYRINGE | INTRAVENOUS | Status: DC | PRN
  Administered 2022-01-25: 40 mg via INTRAVENOUS

## 2022-01-25 MED ORDER — AMISULPRIDE (ANTIEMETIC) 5 MG/2ML IV SOLN: 10.0000 mg | Freq: Once | INTRAVENOUS | Status: DC | PRN

## 2022-01-25 MED ORDER — MIDAZOLAM HCL 2 MG/2ML IJ SOLN
INTRAMUSCULAR | Status: AC
  Filled 2022-01-25: qty 2

## 2022-01-25 MED ORDER — ONDANSETRON HCL 4 MG/2ML IJ SOLN: 4.0000 mg | Freq: Once | INTRAMUSCULAR | Status: DC | PRN

## 2022-01-25 MED ORDER — LACTATED RINGERS IV SOLN: INTRAVENOUS | Status: DC

## 2022-01-25 MED ORDER — HYDROCODONE-ACETAMINOPHEN 7.5-325 MG PO TABS: 1.0000 | ORAL_TABLET | Freq: Four times a day (QID) | ORAL | 0 refills | Status: AC | PRN

## 2022-01-25 MED ORDER — ONDANSETRON HCL 4 MG/2ML IJ SOLN
INTRAMUSCULAR | Status: DC | PRN
  Administered 2022-01-25: 4 mg via INTRAVENOUS

## 2022-01-25 MED ORDER — ALBUTEROL SULFATE HFA 108 (90 BASE) MCG/ACT IN AERS
INHALATION_SPRAY | RESPIRATORY_TRACT | Status: DC | PRN
  Administered 2022-01-25: 6 via RESPIRATORY_TRACT

## 2022-01-25 MED ORDER — FENTANYL CITRATE (PF) 250 MCG/5ML IJ SOLN
INTRAMUSCULAR | Status: DC | PRN
  Administered 2022-01-25: 50 ug via INTRAVENOUS

## 2022-01-25 MED ORDER — CHLORHEXIDINE GLUCONATE 0.12 % MT SOLN
15.0000 mL | OROMUCOSAL | Status: AC
  Filled 2022-01-25: qty 15

## 2022-01-25 MED ORDER — DEXAMETHASONE SODIUM PHOSPHATE 10 MG/ML IJ SOLN
INTRAMUSCULAR | Status: DC | PRN
  Administered 2022-01-25: 10 mg via INTRAVENOUS

## 2022-01-25 MED ORDER — SUGAMMADEX SODIUM 200 MG/2ML IV SOLN
INTRAVENOUS | Status: DC | PRN
  Administered 2022-01-25: 200 mg via INTRAVENOUS

## 2022-01-25 MED ORDER — OXYMETAZOLINE HCL 0.05 % NA SOLN
NASAL | Status: AC
  Filled 2022-01-25: qty 30

## 2022-01-25 MED ORDER — LIDOCAINE-EPINEPHRINE 1 %-1:100000 IJ SOLN
INTRAMUSCULAR | Status: DC | PRN
  Administered 2022-01-25: 1 mL

## 2022-01-25 MED ORDER — LIDOCAINE 2% (20 MG/ML) 5 ML SYRINGE
INTRAMUSCULAR | Status: DC | PRN
  Administered 2022-01-25: 60 mg via INTRAVENOUS

## 2022-01-25 MED ORDER — OXYCODONE HCL 5 MG PO TABS: 5.0000 mg | ORAL_TABLET | Freq: Once | ORAL | Status: DC | PRN

## 2022-01-25 MED ORDER — OXYCODONE HCL 5 MG/5ML PO SOLN: 5.0000 mg | Freq: Once | ORAL | Status: DC | PRN

## 2022-01-25 MED ORDER — FENTANYL CITRATE (PF) 250 MCG/5ML IJ SOLN
INTRAMUSCULAR | Status: AC
  Filled 2022-01-25: qty 5

## 2022-01-25 MED ORDER — MIDAZOLAM HCL 2 MG/2ML IJ SOLN
INTRAMUSCULAR | Status: DC | PRN
  Administered 2022-01-25: 2 mg via INTRAVENOUS

## 2022-01-25 SURGICAL SUPPLY — 47 items
APPLIER CLIP 9.375 MED OPEN (MISCELLANEOUS)
ATTRACTOMAT 16X20 MAGNETIC DRP (DRAPES) IMPLANT
BAG COUNTER SPONGE SURGICOUNT (BAG) ×1 IMPLANT
BLADE SURG 15 STRL LF DISP TIS (BLADE) IMPLANT
BLADE SURG 15 STRL SS (BLADE)
CANISTER SUCT 3000ML PPV (MISCELLANEOUS) ×1 IMPLANT
CLEANER TIP ELECTROSURG 2X2 (MISCELLANEOUS) ×1 IMPLANT
CLIP APPLIE 9.375 MED OPEN (MISCELLANEOUS) IMPLANT
CNTNR URN SCR LID CUP LEK RST (MISCELLANEOUS) ×1 IMPLANT
CONT SPEC 4OZ STRL OR WHT (MISCELLANEOUS) ×1
COVER SURGICAL LIGHT HANDLE (MISCELLANEOUS) ×1 IMPLANT
DRAPE HALF SHEET 40X57 (DRAPES) ×2 IMPLANT
ELECT COATED BLADE 2.86 ST (ELECTRODE) ×1 IMPLANT
ELECT REM PT RETURN 9FT ADLT (ELECTROSURGICAL) ×1
ELECTRODE REM PT RTRN 9FT ADLT (ELECTROSURGICAL) ×1 IMPLANT
GAUZE 4X4 16PLY ~~LOC~~+RFID DBL (SPONGE) ×1 IMPLANT
GAUZE SPONGE 4X4 12PLY STRL (GAUZE/BANDAGES/DRESSINGS) IMPLANT
GLOVE ECLIPSE 7.5 STRL STRAW (GLOVE) ×1 IMPLANT
GOWN STRL REUS W/ TWL LRG LVL3 (GOWN DISPOSABLE) ×2 IMPLANT
GOWN STRL REUS W/TWL LRG LVL3 (GOWN DISPOSABLE) ×2
KIT BASIN OR (CUSTOM PROCEDURE TRAY) ×1 IMPLANT
KIT TURNOVER KIT B (KITS) ×1 IMPLANT
LOCATOR NERVE 3 VOLT (DISPOSABLE) IMPLANT
NDL PRECISIONGLIDE 27X1.5 (NEEDLE) ×1 IMPLANT
NEEDLE PRECISIONGLIDE 27X1.5 (NEEDLE) ×1 IMPLANT
NS IRRIG 1000ML POUR BTL (IV SOLUTION) ×1 IMPLANT
PAD ARMBOARD 7.5X6 YLW CONV (MISCELLANEOUS) ×1 IMPLANT
PENCIL FOOT CONTROL (ELECTRODE) ×1 IMPLANT
POSITIONER HEAD DONUT 9IN (MISCELLANEOUS) ×1 IMPLANT
SPONGE INTESTINAL PEANUT (DISPOSABLE) IMPLANT
SPONGE T-LAP 18X18 ~~LOC~~+RFID (SPONGE) ×1 IMPLANT
SURGILUBE 2OZ TUBE FLIPTOP (MISCELLANEOUS) IMPLANT
SUT CHROMIC 3 0 PS 2 (SUTURE) ×3 IMPLANT
SUT SILK 0 FSL (SUTURE) ×1 IMPLANT
SUT SILK 2 0 PERMA HAND 18 BK (SUTURE) IMPLANT
SUT SILK 3 0 REEL (SUTURE) IMPLANT
SUT SILK 3 0 SH CR/8 (SUTURE) ×1 IMPLANT
SUT SILK 4 0 REEL (SUTURE) ×1 IMPLANT
SUT VIC AB 3-0 SH 27 (SUTURE) ×2
SUT VIC AB 3-0 SH 27XBRD (SUTURE) ×2 IMPLANT
SUT VIC AB 4-0 RB1 18 (SUTURE) IMPLANT
TOWEL GREEN STERILE FF (TOWEL DISPOSABLE) ×1 IMPLANT
TRAY ENT MC OR (CUSTOM PROCEDURE TRAY) ×1 IMPLANT
TRAY FOLEY MTR SLVR 14FR STAT (SET/KITS/TRAYS/PACK) IMPLANT
TUBE CONNECTING 12X1/4 (SUCTIONS) ×1 IMPLANT
UNDERPAD 30X36 HEAVY ABSORB (UNDERPADS AND DIAPERS) ×1 IMPLANT
WATER STERILE IRR 1000ML POUR (IV SOLUTION) ×1 IMPLANT

## 2022-01-25 NOTE — Discharge Instructions (Signed)
Rinse mouth with salt water 3 times daily.

## 2022-01-25 NOTE — Anesthesia Procedure Notes (Signed)
Procedure Name: Intubation Date/Time: 01/25/2022 8:34 AM  Performed by: Carolan Clines, CRNAPre-anesthesia Checklist: Patient identified, Emergency Drugs available, Suction available and Patient being monitored Patient Re-evaluated:Patient Re-evaluated prior to induction Oxygen Delivery Method: Circle System Utilized Preoxygenation: Pre-oxygenation with 100% oxygen Induction Type: IV induction Ventilation: Mask ventilation without difficulty Laryngoscope Size: Glidescope and 3 Grade View: Grade I Tube type: Oral Tube size: 7.0 mm Number of attempts: 1 Airway Equipment and Method: Rigid stylet and Video-laryngoscopy Placement Confirmation: ETT inserted through vocal cords under direct vision, positive ETCO2 and breath sounds checked- equal and bilateral Secured at: 22 cm Tube secured with: Tape Dental Injury: Teeth and Oropharynx as per pre-operative assessment  Comments: Elective glidescope-Mallampati IV

## 2022-01-25 NOTE — Op Note (Signed)
OPERATIVE REPORT  DATE OF SURGERY: 01/25/2022  PATIENT:  Dakota Bryant,  51 y.o. male  PRE-OPERATIVE DIAGNOSIS:  Tongue mass  POST-OPERATIVE DIAGNOSIS:  Tongue mass  PROCEDURE:  Procedure(s): EXCISIONAL BIOPSY OF TONGUE LESION  SURGEON:  Beckie Salts, MD  ASSISTANTS: None  ANESTHESIA:   General   EBL: 10 ml  DRAINS: None  LOCAL MEDICATIONS USED: 1% Xylocaine with epinephrine  SPECIMEN: Excisional biopsy tongue lesion  COUNTS:  Correct  PROCEDURE DETAILS: The patient was taken to the operating room and placed on the operating table in the supine position. Following induction of general endotracheal anesthesia, the face was draped in a standard fashion.  A mandible retractor was used throughout the case.  The right anterior dorsal tongue was infiltrated with local anesthetic where the lesion was located.  The lesion measured approximately 7 mm in diameter.  The excision was approximately 13 mm.  A wedge excision was accomplished and was used for hemostasis.  The defect was reapproximated with inverted interrupted 3-0 Vicryl suture.  The oral cavity was rinsed with saline and suctioned.  Patient was then awakened extubated and transferred to recovery in stable condition.    PATIENT DISPOSITION:  To PACU, stable

## 2022-01-25 NOTE — Anesthesia Postprocedure Evaluation (Signed)
Anesthesia Post Note  Patient: Dakota Bryant  Procedure(s) Performed: EXCISIONAL BIOPSY OF TONGUE LESION (Mouth)     Patient location during evaluation: PACU Anesthesia Type: General Level of consciousness: awake and alert, oriented and patient cooperative Pain management: pain level controlled Vital Signs Assessment: post-procedure vital signs reviewed and stable Respiratory status: spontaneous breathing, nonlabored ventilation and respiratory function stable Cardiovascular status: blood pressure returned to baseline and stable Postop Assessment: no apparent nausea or vomiting Anesthetic complications: no   No notable events documented.  Last Vitals:  Vitals:   01/25/22 0915 01/25/22 0930  BP: 99/78 104/74  Pulse: 77 82  Resp: 16 14  Temp:  36.6 C  SpO2: 95% 96%    Last Pain:  Vitals:   01/25/22 0930  TempSrc:   PainSc: 0-No pain                 Pervis Hocking

## 2022-01-25 NOTE — Interval H&P Note (Signed)
History and Physical Interval Note:  01/25/2022 8:16 AM  Dakota Bryant  has presented today for surgery, with the diagnosis of Tongue mass.  The various methods of treatment have been discussed with the patient and family. After consideration of risks, benefits and other options for treatment, the patient has consented to  Procedure(s): EXCISIONAL BIOPSY OF TONGUE LESION (N/A) as a surgical intervention.  The patient's history has been reviewed, patient examined, no change in status, stable for surgery.  I have reviewed the patient's chart and labs.  Questions were answered to the patient's satisfaction.     [name]

## 2022-01-25 NOTE — Transfer of Care (Signed)
Immediate Anesthesia Transfer of Care Note  Patient: Dakota Bryant  Procedure(s) Performed: EXCISIONAL BIOPSY OF TONGUE LESION (Mouth)  Patient Location: PACU  Anesthesia Type:General  Level of Consciousness: awake, alert , and oriented  Airway & Oxygen Therapy: Patient Spontanous Breathing  Post-op Assessment: Report given to RN and Post -op Vital signs reviewed and stable  Post vital signs: Reviewed and stable  Last Vitals:  Vitals Value Taken Time  BP 115/75 01/25/22 0904  Temp    Pulse 74 01/25/22 0906  Resp 12 01/25/22 0906  SpO2 98 % 01/25/22 0906  Vitals shown include unvalidated device data.  Last Pain:  Vitals:   01/25/22 0724  TempSrc:   PainSc: 0-No pain      Patients Stated Pain Goal: 0 (99/77/41 [id number])  Complications: No notable events documented.

## 2022-01-25 NOTE — Anesthesia Preprocedure Evaluation (Addendum)
Anesthesia Evaluation  Patient identified by MRN, date of birth, ID band Patient awake    Reviewed: Allergy & Precautions, H&P , NPO status , Patient's Chart, lab work & pertinent test results, reviewed documented beta blocker date and time   History of Anesthesia Complications Negative for: history of anesthetic complications (deneis PONV)  Airway Mallampati: IV  TM Distance: >3 FB Neck ROM: Full    Dental  (+) Teeth Intact, Dental Advisory Given   Pulmonary COPD (denies any inhalers), Current Smoker Lung ca  Last CT chest 11/2021: 1. Response to therapy of left lower lobe primary and thoracic nodal metastasis. No new or progressive disease. 2. The right upper lobe pulmonary nodule on prior PET has resolved. A 4 mm more peripheral right upper lobe pulmonary nodule is unchanged. 3. Descending colostomy, without acute complication. 4. Age advanced coronary artery atherosclerosis. Recommend assessment of coronary risk factors. 5. Aortic atherosclerosis (ICD10-I70.0) and emphysema (ICD10-J43.9). 6. Left nephrolithiasis   Current smoker, 59 pack year history, 20-25cigg/d  New tongue lesion   Pulmonary exam normal breath sounds clear to auscultation       Cardiovascular hypertension (103/72 preop), Pt. on medications and Pt. on home beta blockers + Peripheral Vascular Disease  Normal cardiovascular exam Rhythm:Regular Rate:Normal     Neuro/Psych  Headaches PSYCHIATRIC DISORDERS  Depression       GI/Hepatic ,GERD  Controlled and Medicated,,(+)       alcohol use and marijuana usePerforated diverticulum s/p colostomy 05/2021   Endo/Other  negative endocrine ROS    Renal/GU negative Renal ROS  negative genitourinary   Musculoskeletal  (+) Arthritis , Osteoarthritis,    Abdominal   Peds negative pediatric ROS (+)  Hematology negative hematology ROS (+)   Anesthesia Other Findings    Reproductive/Obstetrics negative OB ROS                             Anesthesia Physical Anesthesia Plan  ASA: 3  Anesthesia Plan: General   Post-op Pain Management: Tylenol PO (pre-op)*   Induction: Intravenous  PONV Risk Score and Plan: 3 and Ondansetron, Dexamethasone, Midazolam and Treatment may vary due to age or medical condition  Airway Management Planned: Oral ETT and Video Laryngoscope Planned  Additional Equipment: None  Intra-op Plan:   Post-operative Plan: Extubation in OR  Informed Consent: I have reviewed the patients History and Physical, chart, labs and discussed the procedure including the risks, benefits and alternatives for the proposed anesthesia with the patient or authorized representative who has indicated his/her understanding and acceptance.     Dental advisory given  Plan Discussed with: CRNA  Anesthesia Plan Comments:        Anesthesia Quick Evaluation

## 2022-01-26 ENCOUNTER — Encounter (HOSPITAL_COMMUNITY): Payer: Self-pay | Admitting: Otolaryngology

## 2022-01-26 LAB — SURGICAL PATHOLOGY

## 2022-02-06 NOTE — Progress Notes (Signed)
  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  The patient did note fatigue during radiation but has improved. The patient did not note hair loss or skin changes in the field of radiation during therapy.  The patient isis not taking dexamethasone. The patient does not have symptoms of  weakness or loss of control of the extremities. The patient does not have symptoms of headache. The patient does not have symptoms of seizure or uncontrolled movement. The patient does have symptoms of changes in vision. The patient does not have changes in speech. The patient does have confusion in rare occasions.   Patient is currently complaining of persistent constipation w/ nausea, no taste upon eating, blurry vision on occasion, and clogged ears w/ reduced hearing.  The patient was counseled that he  will be contacted by our brain and spine navigator to schedule surveillance imaging. The patient was encouraged to call if  he  have not received a call to schedule imaging, or if he  develop concerns or questions regarding radiation. The patient will also continue to follow up with Dr. Narda Rutherford in medical oncology on 03/01/22.  This concludes the nursing interview.  Leandra Kern, LPN

## 2022-02-07 ENCOUNTER — Ambulatory Visit
Admission: RE | Admit: 2022-02-07 | Discharge: 2022-02-07 | Disposition: A | Discharge status: Home / Self Care | Payer: 59 | Source: Ambulatory Visit | Attending: Radiation Oncology | Admitting: Radiation Oncology

## 2022-02-13 ENCOUNTER — Other Ambulatory Visit: Payer: Self-pay | Admitting: Hematology and Oncology

## 2022-02-15 ENCOUNTER — Ambulatory Visit (HOSPITAL_BASED_OUTPATIENT_CLINIC_OR_DEPARTMENT_OTHER): Payer: 59 | Admitting: Anesthesiology

## 2022-02-15 ENCOUNTER — Encounter (HOSPITAL_COMMUNITY): Payer: Self-pay | Admitting: Gastroenterology

## 2022-02-15 ENCOUNTER — Encounter (HOSPITAL_COMMUNITY)
Admission: RE | Disposition: A | Discharge status: Home / Self Care | Payer: Self-pay | Source: Home / Self Care | Attending: Gastroenterology

## 2022-02-15 ENCOUNTER — Ambulatory Visit (HOSPITAL_COMMUNITY): Payer: 59 | Admitting: Anesthesiology

## 2022-02-15 ENCOUNTER — Other Ambulatory Visit: Payer: Self-pay

## 2022-02-15 ENCOUNTER — Ambulatory Visit (HOSPITAL_COMMUNITY)
Admission: RE | Admit: 2022-02-15 | Discharge: 2022-02-15 | Disposition: A | Discharge status: Home / Self Care | Payer: 59 | Attending: Gastroenterology | Admitting: Gastroenterology

## 2022-02-15 DIAGNOSIS — Z9049 Acquired absence of other specified parts of digestive tract: Secondary | ICD-10-CM

## 2022-02-15 DIAGNOSIS — I739 Peripheral vascular disease, unspecified: Secondary | ICD-10-CM | POA: Diagnosis not present

## 2022-02-15 DIAGNOSIS — K219 Gastro-esophageal reflux disease without esophagitis: Secondary | ICD-10-CM

## 2022-02-15 DIAGNOSIS — J439 Emphysema, unspecified: Secondary | ICD-10-CM | POA: Insufficient documentation

## 2022-02-15 DIAGNOSIS — K649 Unspecified hemorrhoids: Secondary | ICD-10-CM

## 2022-02-15 DIAGNOSIS — K2289 Other specified disease of esophagus: Secondary | ICD-10-CM | POA: Diagnosis not present

## 2022-02-15 DIAGNOSIS — K449 Diaphragmatic hernia without obstruction or gangrene: Secondary | ICD-10-CM | POA: Diagnosis not present

## 2022-02-15 DIAGNOSIS — Z1211 Encounter for screening for malignant neoplasm of colon: Secondary | ICD-10-CM | POA: Diagnosis not present

## 2022-02-15 DIAGNOSIS — R69 Illness, unspecified: Secondary | ICD-10-CM | POA: Diagnosis not present

## 2022-02-15 DIAGNOSIS — J449 Chronic obstructive pulmonary disease, unspecified: Secondary | ICD-10-CM | POA: Diagnosis not present

## 2022-02-15 DIAGNOSIS — F32A Depression, unspecified: Secondary | ICD-10-CM | POA: Insufficient documentation

## 2022-02-15 DIAGNOSIS — K6289 Other specified diseases of anus and rectum: Secondary | ICD-10-CM

## 2022-02-15 DIAGNOSIS — I10 Essential (primary) hypertension: Secondary | ICD-10-CM | POA: Diagnosis not present

## 2022-02-15 DIAGNOSIS — R1310 Dysphagia, unspecified: Secondary | ICD-10-CM

## 2022-02-15 DIAGNOSIS — D122 Benign neoplasm of ascending colon: Secondary | ICD-10-CM | POA: Insufficient documentation

## 2022-02-15 DIAGNOSIS — Z85118 Personal history of other malignant neoplasm of bronchus and lung: Secondary | ICD-10-CM | POA: Diagnosis not present

## 2022-02-15 DIAGNOSIS — M1990 Unspecified osteoarthritis, unspecified site: Secondary | ICD-10-CM | POA: Diagnosis not present

## 2022-02-15 DIAGNOSIS — K5730 Diverticulosis of large intestine without perforation or abscess without bleeding: Secondary | ICD-10-CM | POA: Diagnosis not present

## 2022-02-15 DIAGNOSIS — K5289 Other specified noninfective gastroenteritis and colitis: Secondary | ICD-10-CM | POA: Insufficient documentation

## 2022-02-15 DIAGNOSIS — D123 Benign neoplasm of transverse colon: Secondary | ICD-10-CM | POA: Insufficient documentation

## 2022-02-15 DIAGNOSIS — K635 Polyp of colon: Secondary | ICD-10-CM

## 2022-02-15 DIAGNOSIS — F17210 Nicotine dependence, cigarettes, uncomplicated: Secondary | ICD-10-CM | POA: Insufficient documentation

## 2022-02-15 DIAGNOSIS — Z933 Colostomy status: Secondary | ICD-10-CM | POA: Diagnosis not present

## 2022-02-15 DIAGNOSIS — R519 Headache, unspecified: Secondary | ICD-10-CM | POA: Diagnosis not present

## 2022-02-15 HISTORY — PX: BIOPSY: SHX5522

## 2022-02-15 HISTORY — PX: POLYPECTOMY: SHX5525

## 2022-02-15 HISTORY — PX: COLONOSCOPY WITH PROPOFOL: SHX5780

## 2022-02-15 HISTORY — PX: ESOPHAGEAL DILATION: SHX303

## 2022-02-15 HISTORY — PX: ESOPHAGOGASTRODUODENOSCOPY (EGD) WITH PROPOFOL: SHX5813

## 2022-02-15 SURGERY — COLONOSCOPY WITH PROPOFOL
Anesthesia: General

## 2022-02-15 MED ORDER — PROPOFOL 500 MG/50ML IV EMUL
INTRAVENOUS | Status: DC | PRN
  Administered 2022-02-15: 200 ug/kg/min via INTRAVENOUS

## 2022-02-15 MED ORDER — PROPOFOL 10 MG/ML IV BOLUS
INTRAVENOUS | Status: DC | PRN
  Administered 2022-02-15: 100 mg via INTRAVENOUS

## 2022-02-15 MED ORDER — LIDOCAINE HCL (CARDIAC) PF 100 MG/5ML IV SOSY
PREFILLED_SYRINGE | INTRAVENOUS | Status: DC | PRN
  Administered 2022-02-15: 100 mg via INTRAVENOUS

## 2022-02-15 MED ORDER — LACTATED RINGERS IV SOLN: INTRAVENOUS | Status: DC

## 2022-02-15 NOTE — Op Note (Signed)
[facility] Patient Name: Dakota Bryant Procedure Date: 02/15/2022 10:45 AM MRN: [id number] Date of Birth: 10-Nov-1970 Attending MD: Maylon Peppers , , [id number] CSN: [id number] Age: 51 Admit Type: Outpatient Procedure:                Upper GI endoscopy Indications:              Dysphagia, Follow-up of gastro-esophageal reflux                            disease Providers:                Maylon Peppers, Janeece Riggers, RN, Everardo Pacific Referring MD:              Medicines:                Monitored Anesthesia Care Complications:            No immediate complications. Estimated Blood Loss:     Estimated blood loss: none. Procedure:                Pre-Anesthesia Assessment:                           - Prior to the procedure, a History and Physical                            was performed, and patient medications, allergies                            and sensitivities were reviewed. The patient's                            tolerance of previous anesthesia was reviewed.                           - The risks and benefits of the procedure and the                            sedation options and risks were discussed with the                            patient. All questions were answered and informed                            consent was obtained.                           - ASA Grade Assessment: III - A patient with severe                            systemic disease.                           After obtaining informed consent, the endoscope was                            passed under direct vision. Throughout the  procedure, the patient's blood pressure, pulse, and                            oxygen saturations were monitored continuously. The                            [id number]-[id number] ([id number]) scope was introduced through the                            mouth, and advanced to the second part of duodenum.                            The upper GI endoscopy was  accomplished without                            difficulty. The patient tolerated the procedure                            well. Scope In: 10:58:41 AM Scope Out: 11:08:21 AM Total Procedure Duration: 0 hours 9 minutes 40 seconds  Findings:      No endoscopic abnormality was evident in the esophagus to explain the       patient's complaint of dysphagia. It was decided, however, to proceed       with dilation of the entire esophagus. A guidewire was placed and the       scope was withdrawn. Dilation was performed with a Savary dilator with       no resistance at 18 mm. The dilation site was examined following       endoscope reinsertion and showed no change. Biopsies were obtained from       the proximal and distal esophagus with cold forceps for histology.      A 1 cm sliding hiatal hernia was found.      The gastroesophageal flap valve was visualized endoscopically and       classified as Hill Grade II (fold present, opens with respiration).      The stomach was normal.      The examined duodenum was normal.      Note: it is possible dysphagia is related to opiate use. Impression:               - No endoscopic esophageal abnormality to explain                            patient's dysphagia. Esophagus dilated. Dilated.                           - 1 cm sliding hiatal hernia.                           - Gastroesophageal flap valve classified as Hill                            Grade II (fold present, opens with respiration).                           - Normal stomach.                           -  Normal examined duodenum.                           - Biopsies were taken with a cold forceps for                            evaluation of eosinophilic esophagitis. Moderate Sedation:      Per Anesthesia Care Recommendation:           - Discharge patient to home (ambulatory).                           - Resume previous diet.                           - Await pathology results.                            - Can consider esophagram if persistent dysphagia.                           - Continue present medications. Procedure Code(s):        --- Professional ---                           [phone], Esophagogastroduodenoscopy, flexible,                            transoral; with insertion of guide wire followed by                            passage of dilator(s) through esophagus over guide                            wire                           43239, 21, Esophagogastroduodenoscopy, flexible,                            transoral; with biopsy, single or multiple Diagnosis Code(s):        --- Professional ---                           R13.10, Dysphagia, unspecified                           K44.9, Diaphragmatic hernia without obstruction or                            gangrene                           K21.9, Gastro-esophageal reflux disease without                            esophagitis CPT copyright 2022 American [employer] Association. All rights reserved. The codes documented in this report are preliminary and upon  coder review may  be revised to meet current compliance requirements. Maylon Peppers, MD Maylon Peppers,  02/15/2022 11:13:26 AM This report has been signed electronically. Number of Addenda: 0

## 2022-02-15 NOTE — Anesthesia Preprocedure Evaluation (Addendum)
Anesthesia Evaluation  Patient identified by MRN, date of birth, ID band Patient awake    Reviewed: Allergy & Precautions, H&P , NPO status , Patient's Chart, lab work & pertinent test results, reviewed documented beta blocker date and time   History of Anesthesia Complications (+) PONV and history of anesthetic complications  Airway Mallampati: II  TM Distance: >3 FB Neck ROM: Full    Dental  (+) Dental Advisory Given, Chipped Right upper molar chipped:   Pulmonary COPD,  COPD inhaler, Current Smoker and Patient abstained from smoking. Left lung cancer   Pulmonary exam normal breath sounds clear to auscultation       Cardiovascular Exercise Tolerance: Good hypertension, Pt. on medications and Pt. on home beta blockers + Peripheral Vascular Disease   Rhythm:Regular Rate:Normal + Systolic murmurs    Neuro/Psych  Headaches PSYCHIATRIC DISORDERS  Depression       GI/Hepatic Bowel prep,GERD  Medicated and Controlled,,(+)     substance abuse  marijuana useColostomy    Endo/Other  negative endocrine ROS    Renal/GU negative Renal ROS  negative genitourinary   Musculoskeletal  (+) Arthritis , Osteoarthritis,    Abdominal   Peds negative pediatric ROS (+)  Hematology negative hematology ROS (+)   Anesthesia Other Findings   Reproductive/Obstetrics negative OB ROS                             Anesthesia Physical Anesthesia Plan  ASA: 3  Anesthesia Plan: General   Post-op Pain Management: Minimal or no pain anticipated   Induction: Intravenous  PONV Risk Score and Plan: 1 and TIVA  Airway Management Planned: Nasal Cannula and Natural Airway  Additional Equipment:   Intra-op Plan:   Post-operative Plan:   Informed Consent: I have reviewed the patients History and Physical, chart, labs and discussed the procedure including the risks, benefits and alternatives for the proposed  anesthesia with the patient or authorized representative who has indicated his/her understanding and acceptance.     Dental advisory given  Plan Discussed with: CRNA and Surgeon  Anesthesia Plan Comments:        Anesthesia Quick Evaluation

## 2022-02-15 NOTE — Transfer of Care (Signed)
Immediate Anesthesia Transfer of Care Note  Patient: Dakota Bryant  Procedure(s) Performed: COLONOSCOPY WITH PROPOFOL ESOPHAGOGASTRODUODENOSCOPY (EGD) WITH PROPOFOL ESOPHAGEAL DILATION BIOPSY POLYPECTOMY  Patient Location: Short Stay  Anesthesia Type:General  Level of Consciousness: awake, alert , oriented, and patient cooperative  Airway & Oxygen Therapy: Patient Spontanous Breathing  Post-op Assessment: Report given to RN, Post -op Vital signs reviewed and stable, and Patient moving all extremities X 4  Post vital signs: Reviewed and stable  Last Vitals:  Vitals Value Taken Time  BP    Temp    Pulse    Resp    SpO2      Last Pain:  Vitals:   02/15/22 1014  TempSrc: Oral  PainSc: 0-No pain      Patients Stated Pain Goal: 7 (38/45/36 4680)  Complications: No notable events documented.

## 2022-02-15 NOTE — Op Note (Signed)
[facility] Patient Name: Dakota Bryant Procedure Date: 02/15/2022 10:45 AM MRN: [id number] Date of Birth: 07-29-1970 Attending MD: Maylon Peppers , , [id number] CSN: [id number] Age: 51 Admit Type: Outpatient Procedure:                Colonoscopy Indications:              Preoperative assessment prior to colostomy takedown                            (history of perforated diverticulitis) Providers:                Maylon Peppers, Janeece Riggers, RN, Everardo Pacific Referring MD:             Lanell Matar. Bridges Medicines:                Monitored Anesthesia Care Complications:            No immediate complications. Estimated Blood Loss:     Estimated blood loss: none. Procedure:                Pre-Anesthesia Assessment:                           - Prior to the procedure, a History and Physical                            was performed, and patient medications, allergies                            and sensitivities were reviewed. The patient's                            tolerance of previous anesthesia was reviewed.                           - The risks and benefits of the procedure and the                            sedation options and risks were discussed with the                            patient. All questions were answered and informed                            consent was obtained.                           - ASA Grade Assessment: III - A patient with severe                            systemic disease.                           After obtaining informed consent, the colonoscope                            was passed under direct  vision. Throughout the                            procedure, the patient's blood pressure, pulse, and                            oxygen saturations were monitored continuously. The                            [id number]-[id number] ([id number]) was introduced through the                            descending colostomy and advanced to the the cecum,                             identified by appendiceal orifice and ileocecal                            valve. Inspection of the rectal stump was perfomed                            as well by inserting the scope throught the rectum.                            The colonoscopy was performed without difficulty.                            The patient tolerated the procedure well. The                            quality of the bowel preparation was excellent. Scope In: 11:14:28 AM Scope Out: 11:45:10 AM Scope Withdrawal Time: 0 hours 12 minutes 5 seconds  Total Procedure Duration: 0 hours 30 minutes 42 seconds  Findings:      Hemorrhoids were found on perianal exam.      A 1 mm polyp was found in the ascending colon. The polyp was sessile.       The polyp was removed with a cold biopsy forceps. Resection and       retrieval were complete.      A 5 mm polyp was found in the transverse colon. The polyp was sessile.       The polyp was removed with a cold snare. Resection and retrieval were       complete.      A few small-mouthed diverticula were found in the descending colon.      A diffuse area of mildly friable mucosa with no bleeding was found in       the rectal stump - this was consistent with diversion colitis. No       retroflexion performed due to friability. Impression:               - Hemorrhoids found on perianal exam.                           - One 1 mm polyp in the ascending colon, removed  with a cold biopsy forceps. Resected and retrieved.                           - One 5 mm polyp in the transverse colon, removed                            with a cold snare. Resected and retrieved.                           - Diverticulosis in the descending colon.                           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 Moderate Sedation:      Per Anesthesia Care Recommendation:           - Discharge patient to home (ambulatory).                           - Resume previous  diet.                           - Await pathology results.                           - Repeat colonoscopy for surveillance based on                            pathology results.                           - Ok to proceed with colostomy takedown from GI                            perspective - informed Dr. Constance Haw about results. Procedure Code(s):        --- Professional ---                           [phone], Colonoscopy through stoma; with removal of                            tumor(s), polyp(s), or other lesion(s) by snare                            technique                           92426, 37, Colonoscopy through stoma; with biopsy,                            single or multiple Diagnosis Code(s):        --- Professional ---                           D12.2, Benign neoplasm of ascending colon  D12.3, Benign neoplasm of transverse colon (hepatic                            flexure or splenic flexure)                           K64.9, Unspecified hemorrhoids                           K62.89, Other specified diseases of anus and rectum                           Z01.818, Encounter for other preprocedural                            examination                           K57.30, Diverticulosis of large intestine without                            perforation or abscess without bleeding CPT copyright 2022 American Medical Association. All rights reserved. The codes documented in this report are preliminary and upon coder review may  be revised to meet current compliance requirements. Maylon Peppers, MD Maylon Peppers,  02/15/2022 11:55:35 AM This report has been signed electronically. Number of Addenda: 0

## 2022-02-15 NOTE — Anesthesia Postprocedure Evaluation (Signed)
Anesthesia Post Note  Patient: Dakota Bryant  Procedure(s) Performed: COLONOSCOPY WITH PROPOFOL ESOPHAGOGASTRODUODENOSCOPY (EGD) WITH PROPOFOL ESOPHAGEAL DILATION BIOPSY POLYPECTOMY  Patient location during evaluation: Phase II Anesthesia Type: General Level of consciousness: awake and alert and oriented Pain management: pain level controlled Vital Signs Assessment: post-procedure vital signs reviewed and stable Respiratory status: spontaneous breathing, nonlabored ventilation and respiratory function stable Cardiovascular status: blood pressure returned to baseline and stable Postop Assessment: no apparent nausea or vomiting Anesthetic complications: no  No notable events documented.   Last Vitals:  Vitals:   02/15/22 1155 02/15/22 1200  BP: 121/86 (!) 141/90  Pulse: 68 86  Resp: 12 12  Temp:    SpO2: 98% 100%    Last Pain:  Vitals:   02/15/22 1200  TempSrc:   PainSc: 0-No pain                 Roben Schliep C Alek Poncedeleon

## 2022-02-15 NOTE — Discharge Instructions (Addendum)
You are being discharged to home.  Resume your previous diet.  We are waiting for your pathology results.  Continue your present medications.  Your physician has recommended a repeat colonoscopy for surveillance based on pathology results.  Follow up with Dr. Constance Haw regarding colostomy takedown.

## 2022-02-15 NOTE — H&P (Signed)
Dakota Bryant is an 51 y.o. male.   Chief Complaint: screening prior to colostomy reversal, dysphagia and GERD. 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  States that for the last 4 weeks he has presented recurrent constipation episodes for which she has had to take laxatives intermittently.  No melena, hematochezia, abdominal pain, nausea or vomiting.  Has had some improvement of his dysphagia with PPI but is still having some dysphagia with liquids.  Past Medical History:  Diagnosis Date   Acid reflux    Arthritis    Colostomy in place [facility] [place])    COVID 2021   moderate symptoms   Depression    in his thirties   Emphysema lung ([place])    pt not aware of this   Headache    in his twenties   Hypertension    Mass of left lung    Perforated diverticulum    PONV (postoperative nausea and vomiting)    Small cell lung cancer [facility])     Past Surgical History:  Procedure Laterality Date   BRONCHIAL BRUSHINGS  07/26/2021   Procedure: BRONCHIAL BRUSHINGS;  Surgeon: Margaretha Seeds, MD;  Location: [facility];  Service: Cardiopulmonary;;   BRONCHIAL WASHINGS  07/26/2021   Procedure: BRONCHIAL WASHINGS;  Surgeon: Margaretha Seeds, MD;  Location: [facility];  Service: Cardiopulmonary;;   COLOSTOMY N/A 06/03/2021   Procedure: SIGMOID COLECTOMY WITH COLOSTOMY;  Surgeon: Virl Cagey, MD;  Location: [facility];  Service: General;  Laterality: N/A;   ENDOBRONCHIAL ULTRASOUND Bilateral 07/26/2021   Procedure: ENDOBRONCHIAL ULTRASOUND;  Surgeon: Margaretha Seeds, MD;  Location: [facility];  Service: Cardiopulmonary;  Laterality: Bilateral;   EXCISION OF TONGUE LESION N/A 01/25/2022   Procedure: EXCISIONAL BIOPSY OF TONGUE LESION;  Surgeon: [name], MD;  Location:  [facility];  Service: ENT;  Laterality: N/A;   FINE NEEDLE ASPIRATION  07/26/2021   Procedure: FINE NEEDLE ASPIRATION;  Surgeon: Margaretha Seeds, MD;  Location: [facility];  Service: Cardiopulmonary;;   HAND TENDON SURGERY     IR IMAGING GUIDED PORT INSERTION  08/10/2021   KNEE ARTHROSCOPY Left    VIDEO BRONCHOSCOPY  07/26/2021   Procedure: VIDEO BRONCHOSCOPY WITHOUT FLUORO;  Surgeon: Margaretha Seeds, MD;  Location: [facility];  Service: Cardiopulmonary;;    History reviewed. No pertinent family history. Social History:  reports that he has been smoking cigarettes. He started smoking about 40 years ago. He has a 39.00 pack-year smoking history. He has been exposed to tobacco smoke. He has never used smokeless tobacco. He reports that he does not currently use alcohol after a past usage of about 2.0 standard drinks of alcohol per week. He reports current drug use. Frequency: 3.00 times per week. Drug: Marijuana.  Allergies: No Known Allergies  Medications Prior to Admission  Medication Sig Dispense Refill   amLODipine (NORVASC) 10 MG tablet Take 1 tablet (10 mg total) by mouth daily. 30 tablet 1   cetirizine (ZYRTEC) 10 MG tablet Take 1 tablet by mouth once daily 30 tablet 0   Coenzyme Q10 (CO Q-10) 50 MG CAPS Take 50 mg by mouth daily.     esomeprazole (NEXIUM) 20 MG capsule Take 20 mg by mouth daily at 12 noon.     HYDROcodone-acetaminophen (NORCO) 7.5-325 MG tablet Take 1 tablet by mouth every 6 (six)  hours as needed for moderate pain. 20 tablet 0   labetalol (NORMODYNE) 200 MG tablet Take 1 tablet (200 mg total) by mouth 2 (two) times daily. 60 tablet 3   Multiple Vitamin (MULTIVITAMIN WITH MINERALS) TABS tablet Take 1 tablet by mouth daily. 120 tablet 3   Naphazoline-Glycerin-Zinc Sulf (CLEAR EYES MAXIMUM ITCHY EYE) 0.012-0.25-0.25 % SOLN Place 1 drop into both eyes 2 (two) times daily.     ondansetron (ZOFRAN-ODT) 4 MG disintegrating tablet Take 1 tablet (4 mg total) by mouth every 8  (eight) hours as needed for nausea or vomiting. 20 tablet 0   oxymetazoline (AFRIN) 0.05 % nasal spray Place 1 spray into both nostrils 2 (two) times daily. Equate No drip     pantoprazole (PROTONIX) 40 MG tablet Take 40 mg by mouth at bedtime.     senna-docusate (SENNA S) 8.6-50 MG tablet Take 1 tablet by mouth daily. May increase to 2 daily at bedtime as needed (Patient taking differently: Take 1 tablet by mouth daily as needed for mild constipation. May increase to 2 daily at bedtime as needed)     Sennosides (LAXATIVE) 25 MG TABS Take 25 mg by mouth daily as needed (Constipation). Equate Max. strength     sucralfate (CARAFATE) 1 g tablet Take 1 g by mouth 2 (two) times daily.     tamsulosin (FLOMAX) 0.4 MG CAPS capsule Take 1 capsule (0.4 mg total) by mouth daily. 30 capsule 2   traZODone (DESYREL) 50 MG tablet Take 1 tablet (50 mg total) by mouth at bedtime. (Patient taking differently: Take 25-50 mg by mouth at bedtime.) 30 tablet 4   ondansetron (ZOFRAN) 8 MG tablet Take 1 tablet (8 mg total) by mouth every 8 (eight) hours as needed. (Patient not taking: Reported on 02/14/2022) 30 tablet 0   polyethylene glycol-electrolytes (TRILYTE) 420 g solution Take 4,000 mLs by mouth as directed. 4000 mL 0    No results found for this or any previous visit (from the past 48 hour(s)). No results found.  Review of Systems  Constitutional: Negative.   HENT:  Positive for trouble swallowing.   Eyes: Negative.   Respiratory: Negative.    Cardiovascular: Negative.   Gastrointestinal:  Positive for constipation.  Endocrine: Negative.   Genitourinary: Negative.   Musculoskeletal: Negative.   Skin: Negative.   Allergic/Immunologic: Negative.   Neurological: Negative.   Hematological: Negative.   Psychiatric/Behavioral: Negative.      Blood pressure (!) 139/99, temperature 98.1 F (36.7 C), temperature source Oral, resp. rate 18, weight 59 kg, SpO2 98 %. Physical Exam  GENERAL: The patient is AO  x3, in no acute distress. HEENT: Head is normocephalic and atraumatic. EOMI are intact. Mouth is well hydrated and without lesions. NECK: Supple. No masses LUNGS: Clear to auscultation. No presence of rhonchi/wheezing/rales. Adequate chest expansion HEART: RRR, normal s1 and s2. ABDOMEN: Soft, nontender, no guarding, no peritoneal signs, and nondistended. BS +. No masses. EXTREMITIES: Without any cyanosis, clubbing, rash, lesions or edema. NEUROLOGIC: AOx3, no focal motor deficit. SKIN: no jaundice, no rashes  Assessment/Plan 51 year old male with past medical history of diverticulitis status post sigmoid colectomy, GERD, hypertension, depression, small cell lung cancer status postradiation and chemotherapy, who comes for screening colonoscopy prior to colostomy reversal, dysphagia and GERD.  Will proceed with EGD and colonoscopy.  Harvel Quale, MD 02/15/2022, 10:46 AM

## 2022-02-16 LAB — SURGICAL PATHOLOGY

## 2022-02-17 NOTE — Progress Notes (Signed)
Colonoscopy performed for screening.

## 2022-02-19 DIAGNOSIS — H5213 Myopia, bilateral: Secondary | ICD-10-CM | POA: Diagnosis not present

## 2022-02-21 ENCOUNTER — Encounter (HOSPITAL_COMMUNITY): Payer: Self-pay | Admitting: Gastroenterology

## 2022-02-21 ENCOUNTER — Ambulatory Visit (HOSPITAL_COMMUNITY)
Admission: RE | Admit: 2022-02-21 | Discharge: 2022-02-21 | Disposition: A | Discharge status: Home / Self Care | Payer: 59 | Source: Ambulatory Visit | Attending: Hematology and Oncology | Admitting: Hematology and Oncology

## 2022-02-21 DIAGNOSIS — C787 Secondary malignant neoplasm of liver and intrahepatic bile duct: Secondary | ICD-10-CM | POA: Diagnosis not present

## 2022-02-21 DIAGNOSIS — C3432 Malignant neoplasm of lower lobe, left bronchus or lung: Secondary | ICD-10-CM | POA: Diagnosis present

## 2022-02-21 DIAGNOSIS — K769 Liver disease, unspecified: Secondary | ICD-10-CM | POA: Diagnosis not present

## 2022-02-21 DIAGNOSIS — Z933 Colostomy status: Secondary | ICD-10-CM | POA: Diagnosis not present

## 2022-02-21 DIAGNOSIS — R918 Other nonspecific abnormal finding of lung field: Secondary | ICD-10-CM | POA: Diagnosis not present

## 2022-02-21 DIAGNOSIS — J90 Pleural effusion, not elsewhere classified: Secondary | ICD-10-CM | POA: Diagnosis not present

## 2022-02-21 DIAGNOSIS — C3490 Malignant neoplasm of unspecified part of unspecified bronchus or lung: Secondary | ICD-10-CM | POA: Diagnosis not present

## 2022-02-21 MED ORDER — IOHEXOL 300 MG/ML  SOLN
100.0000 mL | Freq: Once | INTRAMUSCULAR | Status: AC | PRN
  Administered 2022-02-21: 100 mL via INTRAVENOUS

## 2022-02-27 ENCOUNTER — Telehealth (INDEPENDENT_AMBULATORY_CARE_PROVIDER_SITE_OTHER): Payer: Self-pay | Admitting: *Deleted

## 2022-02-27 ENCOUNTER — Other Ambulatory Visit: Payer: Self-pay

## 2022-02-27 ENCOUNTER — Encounter (HOSPITAL_COMMUNITY): Payer: Self-pay

## 2022-02-27 ENCOUNTER — Observation Stay (HOSPITAL_COMMUNITY): Payer: 59

## 2022-02-27 ENCOUNTER — Emergency Department (HOSPITAL_COMMUNITY): Payer: 59

## 2022-02-27 ENCOUNTER — Observation Stay (HOSPITAL_COMMUNITY)
Admission: EM | Admit: 2022-02-27 | Discharge: 2022-02-28 | Disposition: A | Discharge status: Home / Self Care | Payer: 59 | Attending: Internal Medicine | Admitting: Internal Medicine

## 2022-02-27 ENCOUNTER — Encounter: Payer: Self-pay | Admitting: Hematology and Oncology

## 2022-02-27 DIAGNOSIS — R69 Illness, unspecified: Secondary | ICD-10-CM | POA: Diagnosis not present

## 2022-02-27 DIAGNOSIS — M545 Low back pain, unspecified: Secondary | ICD-10-CM

## 2022-02-27 DIAGNOSIS — C787 Secondary malignant neoplasm of liver and intrahepatic bile duct: Secondary | ICD-10-CM | POA: Diagnosis not present

## 2022-02-27 DIAGNOSIS — Z79899 Other long term (current) drug therapy: Secondary | ICD-10-CM | POA: Diagnosis not present

## 2022-02-27 DIAGNOSIS — Z933 Colostomy status: Secondary | ICD-10-CM | POA: Insufficient documentation

## 2022-02-27 DIAGNOSIS — M549 Dorsalgia, unspecified: Secondary | ICD-10-CM | POA: Insufficient documentation

## 2022-02-27 DIAGNOSIS — Z8616 Personal history of COVID-19: Secondary | ICD-10-CM | POA: Diagnosis not present

## 2022-02-27 DIAGNOSIS — R531 Weakness: Secondary | ICD-10-CM | POA: Insufficient documentation

## 2022-02-27 DIAGNOSIS — F1010 Alcohol abuse, uncomplicated: Secondary | ICD-10-CM

## 2022-02-27 DIAGNOSIS — N3289 Other specified disorders of bladder: Secondary | ICD-10-CM | POA: Diagnosis not present

## 2022-02-27 DIAGNOSIS — F17210 Nicotine dependence, cigarettes, uncomplicated: Secondary | ICD-10-CM | POA: Diagnosis not present

## 2022-02-27 DIAGNOSIS — R55 Syncope and collapse: Secondary | ICD-10-CM

## 2022-02-27 DIAGNOSIS — I10 Essential (primary) hypertension: Secondary | ICD-10-CM | POA: Insufficient documentation

## 2022-02-27 DIAGNOSIS — Z95828 Presence of other vascular implants and grafts: Secondary | ICD-10-CM

## 2022-02-27 DIAGNOSIS — Z959 Presence of cardiac and vascular implant and graft, unspecified: Secondary | ICD-10-CM | POA: Insufficient documentation

## 2022-02-27 DIAGNOSIS — K828 Other specified diseases of gallbladder: Secondary | ICD-10-CM | POA: Diagnosis not present

## 2022-02-27 DIAGNOSIS — C3412 Malignant neoplasm of upper lobe, left bronchus or lung: Secondary | ICD-10-CM | POA: Insufficient documentation

## 2022-02-27 DIAGNOSIS — E871 Hypo-osmolality and hyponatremia: Principal | ICD-10-CM | POA: Insufficient documentation

## 2022-02-27 DIAGNOSIS — N200 Calculus of kidney: Secondary | ICD-10-CM | POA: Diagnosis not present

## 2022-02-27 LAB — CBC
HCT: 41.2 % (ref 39.0–52.0)
Hemoglobin: 14.9 g/dL (ref 13.0–17.0)
MCH: 32 pg (ref 26.0–34.0)
MCHC: 36.2 g/dL — ABNORMAL HIGH (ref 30.0–36.0)
MCV: 88.4 fL (ref 80.0–100.0)
Platelets: 214 10*3/uL (ref 150–400)
RBC: 4.66 MIL/uL (ref 4.22–5.81)
RDW: 11.9 % (ref 11.5–15.5)
WBC: 6.5 10*3/uL (ref 4.0–10.5)
nRBC: 0 % (ref 0.0–0.2)

## 2022-02-27 LAB — URINALYSIS, ROUTINE W REFLEX MICROSCOPIC
Bilirubin Urine: NEGATIVE
Glucose, UA: NEGATIVE mg/dL
Hgb urine dipstick: NEGATIVE
Ketones, ur: 80 mg/dL — AB
Leukocytes,Ua: NEGATIVE
Nitrite: NEGATIVE
Protein, ur: NEGATIVE mg/dL
Specific Gravity, Urine: 1.012 (ref 1.005–1.030)
pH: 6 (ref 5.0–8.0)

## 2022-02-27 LAB — MAGNESIUM: Magnesium: 1.9 mg/dL (ref 1.7–2.4)

## 2022-02-27 LAB — BASIC METABOLIC PANEL
Anion gap: 12 (ref 5–15)
BUN: 10 mg/dL (ref 6–20)
CO2: 21 mmol/L — ABNORMAL LOW (ref 22–32)
Calcium: 8.9 mg/dL (ref 8.9–10.3)
Chloride: 87 mmol/L — ABNORMAL LOW (ref 98–111)
Creatinine, Ser: 0.64 mg/dL (ref 0.61–1.24)
GFR, Estimated: >60 mL/min (ref >60)
Glucose, Bld: 91 mg/dL (ref 70–99)
Potassium: 3.6 mmol/L (ref 3.5–5.1)
Sodium: 120 mmol/L — ABNORMAL LOW (ref 135–145)

## 2022-02-27 LAB — CBG MONITORING, ED: Glucose-Capillary: 90 mg/dL (ref 70–99)

## 2022-02-27 LAB — SODIUM, URINE, RANDOM: Sodium, Ur: 83 mmol/L

## 2022-02-27 LAB — PHOSPHORUS: Phosphorus: 1.3 mg/dL — ABNORMAL LOW (ref 2.5–4.6)

## 2022-02-27 MED ORDER — SENNOSIDES-DOCUSATE SODIUM 8.6-50 MG PO TABS
1.0000 | ORAL_TABLET | Freq: Two times a day (BID) | ORAL | Status: DC
  Administered 2022-02-27: 1 via ORAL
  Filled 2022-02-27 (×2): qty 1

## 2022-02-27 MED ORDER — ENOXAPARIN SODIUM 40 MG/0.4ML IJ SOSY
40.0000 mg | PREFILLED_SYRINGE | INTRAMUSCULAR | Status: DC
  Administered 2022-02-27: 40 mg via SUBCUTANEOUS
  Filled 2022-02-27: qty 0.4

## 2022-02-27 MED ORDER — PANTOPRAZOLE SODIUM 40 MG PO TBEC
40.0000 mg | DELAYED_RELEASE_TABLET | Freq: Every day | ORAL | Status: DC
  Administered 2022-02-28: 40 mg via ORAL
  Filled 2022-02-27: qty 1

## 2022-02-27 MED ORDER — SODIUM CHLORIDE 0.9 % IV BOLUS
1000.0000 mL | Freq: Once | INTRAVENOUS | Status: AC
  Administered 2022-02-27: 1000 mL via INTRAVENOUS

## 2022-02-27 MED ORDER — ACETAMINOPHEN 325 MG PO TABS: 650.0000 mg | ORAL_TABLET | Freq: Four times a day (QID) | ORAL | Status: DC | PRN

## 2022-02-27 MED ORDER — POTASSIUM CHLORIDE IN NACL 20-0.9 MEQ/L-% IV SOLN: INTRAVENOUS | Status: DC

## 2022-02-27 MED ORDER — AMLODIPINE BESYLATE 5 MG PO TABS
10.0000 mg | ORAL_TABLET | Freq: Every day | ORAL | Status: DC
  Administered 2022-02-28: 10 mg via ORAL
  Filled 2022-02-27: qty 2

## 2022-02-27 MED ORDER — POLYETHYLENE GLYCOL 3350 17 G PO PACK
17.0000 g | PACK | Freq: Two times a day (BID) | ORAL | Status: DC
  Filled 2022-02-27 (×2): qty 1

## 2022-02-27 MED ORDER — ONDANSETRON HCL 4 MG/2ML IJ SOLN: 4.0000 mg | Freq: Four times a day (QID) | INTRAMUSCULAR | Status: DC | PRN

## 2022-02-27 MED ORDER — ONDANSETRON HCL 4 MG PO TABS: 4.0000 mg | ORAL_TABLET | Freq: Four times a day (QID) | ORAL | Status: DC | PRN

## 2022-02-27 MED ORDER — TRAZODONE HCL 50 MG PO TABS
25.0000 mg | ORAL_TABLET | Freq: Every day | ORAL | Status: DC
  Administered 2022-02-27: 50 mg via ORAL
  Filled 2022-02-27: qty 1

## 2022-02-27 MED ORDER — ACETAMINOPHEN 650 MG RE SUPP: 650.0000 mg | Freq: Four times a day (QID) | RECTAL | Status: DC | PRN

## 2022-02-27 MED ORDER — MORPHINE SULFATE (PF) 2 MG/ML IV SOLN
2.0000 mg | Freq: Once | INTRAVENOUS | Status: AC
  Administered 2022-02-27: 2 mg via INTRAVENOUS
  Filled 2022-02-27: qty 1

## 2022-02-27 MED ORDER — LABETALOL HCL 200 MG PO TABS
200.0000 mg | ORAL_TABLET | Freq: Two times a day (BID) | ORAL | Status: DC
  Administered 2022-02-27 – 2022-02-28 (×2): 200 mg via ORAL
  Filled 2022-02-27 (×2): qty 1

## 2022-02-27 MED ORDER — HYDROCODONE-ACETAMINOPHEN 7.5-325 MG PO TABS
1.0000 | ORAL_TABLET | Freq: Four times a day (QID) | ORAL | Status: DC | PRN
  Administered 2022-02-27: 1 via ORAL
  Filled 2022-02-27: qty 1

## 2022-02-27 MED ORDER — TAMSULOSIN HCL 0.4 MG PO CAPS
0.4000 mg | ORAL_CAPSULE | Freq: Every day | ORAL | Status: DC
  Administered 2022-02-28: 0.4 mg via ORAL
  Filled 2022-02-27: qty 1

## 2022-02-27 MED ORDER — IOHEXOL 300 MG/ML  SOLN
80.0000 mL | Freq: Once | INTRAMUSCULAR | Status: AC | PRN
  Administered 2022-02-27: 80 mL via INTRAVENOUS

## 2022-02-27 NOTE — Assessment & Plan Note (Addendum)
Denies any alcoholic beverage in at least 1 year.  He was admitted 05/2021 for sepsis 2/2 diverticulitis, was also managed for alcohol abuse with withdrawal-required Precedex drip and Librium detox protocol, in addition to management for his sepsis secondary to acute sigmoid diverticulitis. -CIWA as needed -Check mag, Phos

## 2022-02-27 NOTE — Telephone Encounter (Signed)
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 He reports he is not feeling better today. Has not been able to eat due to nausea since having colonscopy done recently. I advised him he should go to ED with the symptoms he is having. He states he will call his friend to take him to [facility].

## 2022-02-27 NOTE — ED Provider Notes (Signed)
[facility] Provider Note   CSN: [id number] Arrival date & time: 02/27/22  1326     History  Chief Complaint  Patient presents with   Loss of Consciousness    Dakota Bryant is a 51 y.o. male.  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  He denies any injury from the fall.  Patient states he had a colonoscopy last week and became constipated postprocedure.  He states he is taking laxatives which have relieved the constipation but that he has had decreased appetite with little oral intake since that time.  He states he has drank very little water but has been having Gatorade and green tea.  Patient with history of small cell lung cancer, colostomy, emphysema, depression, alcohol abuse, GERD  HPI     Home Medications Prior to Admission medications   Medication Sig Start Date End Date Taking? Authorizing Provider  amLODipine (NORVASC) 10 MG tablet Take 1 tablet by mouth once daily 02/16/22  Yes Orson Slick, MD  cetirizine (ZYRTEC) 10 MG tablet Take 1 tablet by mouth once daily 02/16/22  Yes Ledell Peoples IV, MD  Coenzyme Q10 (CO Q-10) 50 MG CAPS Take 50 mg by mouth daily.   Yes [provider]  esomeprazole (NEXIUM) 20 MG capsule Take 20 mg by mouth daily at 12 noon.   Yes [provider]  HYDROcodone-acetaminophen (NORCO) 7.5-325 MG tablet Take 1 tablet by mouth every 6 (six) hours as needed for moderate pain. 01/25/22  Yes [name], MD  labetalol (NORMODYNE) 200 MG tablet Take 1 tablet by mouth twice daily 02/16/22  Yes Orson Slick, MD  Naphazoline-Glycerin-Zinc Sulf (CLEAR EYES MAXIMUM ITCHY EYE) 0.012-0.25-0.25 % SOLN Place 1 drop into both eyes 2 (two) times daily.   Yes [provider]  oxymetazoline (AFRIN) 0.05 % nasal spray Place 1 spray into  both nostrils 2 (two) times daily. Equate No drip   Yes [provider]  Sennosides (LAXATIVE) 25 MG TABS Take 25 mg by mouth daily as needed (Constipation). Equate Max. strength   Yes [provider]  tamsulosin (FLOMAX) 0.4 MG CAPS capsule Take 1 capsule (0.4 mg total) by mouth daily. 01/03/22  Yes Orson Slick, MD  traZODone (DESYREL) 50 MG tablet Take 1 tablet (50 mg total) by mouth at bedtime. Patient taking differently: Take 25-50 mg by mouth at bedtime. 10/20/21  Yes Orson Slick, MD  ondansetron (ZOFRAN) 8 MG tablet Take 1 tablet (8 mg total) by mouth every 8 (eight) hours as needed. Patient not taking: Reported on 02/14/2022 08/02/21   Orson Slick, MD  ondansetron (ZOFRAN-ODT) 4 MG disintegrating tablet Take 1 tablet (4 mg total) by mouth every 8 (eight) hours as needed for nausea or vomiting. Patient not taking: Reported on 02/27/2022 01/25/22   [name], MD  senna-docusate (SENNA S) 8.6-50 MG tablet Take 1 tablet by mouth daily. May increase to 2 daily at bedtime as needed Patient taking differently: Take 1 tablet by mouth daily as needed for mild constipation. May increase to 2 daily at bedtime as needed 09/30/21   Orson Slick, MD      Allergies    Patient has no known allergies.    Review of Systems   Review of Systems  Constitutional:  Negative for fever.  Respiratory:  Negative for shortness of breath.   Cardiovascular:  Negative for chest pain.  Gastrointestinal:  Positive for constipation and nausea. Negative for abdominal pain, diarrhea and vomiting.  Genitourinary:  Negative for dysuria.  Neurological:  Positive for syncope and weakness.    Physical Exam Updated Vital Signs BP 131/86   Pulse 95   Temp 98.5 F (36.9 C) (Oral)   Resp 14   Ht 5\' 6"  (1.676 m)   Wt 59 kg   SpO2 100%   BMI 20.98 kg/m  Physical Exam Vitals and nursing note reviewed.  Constitutional:      General: He is not in acute distress.    Appearance:  He is well-developed. He is ill-appearing.  HENT:     Head: Normocephalic and atraumatic.     Mouth/Throat:     Mouth: Mucous membranes are moist.  Eyes:     Conjunctiva/sclera: Conjunctivae normal.  Cardiovascular:     Rate and Rhythm: Normal rate and regular rhythm.     Heart sounds: No murmur heard. Pulmonary:     Effort: Pulmonary effort is normal. No respiratory distress.     Breath sounds: Normal breath sounds.  Abdominal:     Palpations: Abdomen is soft.     Tenderness: There is no abdominal tenderness.     Comments: Colostomy in place  Musculoskeletal:        General: No swelling.     Cervical back: Neck supple.  Skin:    General: Skin is warm and dry.     Capillary Refill: Capillary refill takes less than 2 seconds.  Neurological:     Mental Status: He is alert.  Psychiatric:        Mood and Affect: Mood normal.     ED Results / Procedures / Treatments   Labs (all labs ordered are listed, but only abnormal results are displayed) Labs Reviewed  BASIC METABOLIC PANEL - Abnormal; Notable for the following components:      Result Value   Sodium 120 (*)    Chloride 87 (*)    CO2 21 (*)    All other components within normal limits  CBC - Abnormal; Notable for the following components:   MCHC 36.2 (*)    All other components within normal limits  URINALYSIS, ROUTINE W REFLEX MICROSCOPIC  CBG MONITORING, ED    EKG None  Radiology DG Chest Port 1 View  Result Date: 02/27/2022 CLINICAL DATA:  Provided history: Weakness. EXAM: PORTABLE CHEST 1 VIEW COMPARISON:  Prior chest radiographs 06/07/2021 and earlier. Chest CT 02/21/2022. FINDINGS: Right chest infusion port catheter with tip terminating at the level of the superior cavoatrial junction. Heart size within normal limits. Pulmonary parenchymal changes within the medial right upper lobe, better characterized on the recent prior chest CT of 02/21/2022. No appreciable airspace consolidation elsewhere. No evidence  of pleural effusion or pneumothorax. Degenerative changes of the spine. IMPRESSION: Pulmonary parenchymal changes within the medial right upper lobe, better characterized on the recent prior chest CT of 02/21/2022. Please refer to this prior report for further description. Elsewhere, there is no appreciable airspace consolidation. Electronically Signed   By: Kellie Simmering D.O.   On: 02/27/2022 14:58    Procedures Procedures    Medications Ordered in ED Medications  sodium chloride 0.9 % bolus 1,000 mL (1,000 mLs Intravenous New Bag/Given 02/27/22 1427)    ED Course/ Medical Decision Making/ A&P  Medical Decision Making Amount and/or Complexity of Data Reviewed Labs: ordered. Radiology: ordered.   This patient presents to the ED for concern of weakness with syncope, this involves an extensive number of treatment options, and is a complaint that carries with it a high risk of complications and morbidity.  The differential diagnosis includes dysrhythmia, vasovagal response, dehydration, electrolyte abnormality, and others   Co morbidities that complicate the patient evaluation  History of small cell lung cancer   Additional history obtained:   External records from outside source obtained and reviewed including GI notes from October 25, records showing excisional biopsy of a tongue lesion on November 8, colonoscopy on November 29   Lab Tests:  I Ordered, and personally interpreted labs.  The pertinent results include: Sodium 120   Imaging Studies ordered:  I ordered imaging studies including chest x-ray I independently visualized and interpreted imaging which showed  Pulmonary parenchymal changes within the medial right upper lobe,  better characterized on the recent prior chest CT of 02/21/2022.  No other appreciable airway consolidation I agree with the radiologist interpretation   Cardiac Monitoring: / EKG:  The patient was maintained on a  cardiac monitor.  I personally viewed and interpreted the cardiac monitored which showed an underlying rhythm of: Sinus rhythm   Consultations Obtained:  I requested consultation with the hospitalist, Dr. Denton Brick  and discussed lab and imaging findings as well as pertinent plan - they recommend: admission   Problem List / ED Course / Critical interventions / Medication management   I ordered medication including normal saline for dehydration Reevaluation of the patient after these medicines showed that the patient stayed the same I have reviewed the patients home medicines and have made adjustments as needed  Test / Admission - Considered:  Patient significantly hyponatremic at this time.  Patient has history of hyponatremia with levels as low as the low 120s.  No previous results as well as today's level of 120.  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  Patient will need admission for sodium repletion. No dysrhythmia, no orthostatic changes. 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

## 2022-02-27 NOTE — Assessment & Plan Note (Addendum)
-  Recent colonoscopy 11/29-evaluating for colostomy reversal.  He has since developed new back pain, constipation and nausea reporting some inability to eat. -CT abdomen and pelvis demonstrating no acute abnormalities -Continue efforts to maintain adequate hydration and increase oral intake -Continue outpatient follow-up with gastroenterology service.

## 2022-02-27 NOTE — Assessment & Plan Note (Signed)
Stage IIIb.  Diagnosed 05/2021.  Follows with Dr. Lorenso Courier.  Status post completion of 4 cycles of chemotherapy and radiation therapy.  Currently not receiving treatment. - Had excision of tongue mass 11/8 by Dr. 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  No high-grade dysplasia or malignancy.

## 2022-02-27 NOTE — ED Notes (Signed)
CBG:90 

## 2022-02-27 NOTE — H&P (Signed)
History and Physical    Dakota Bryant GUY:[id number] DOB: 02/13/1971 DOA: 02/27/2022  PCP: Pcp, No   Patient coming from: Home  I have personally briefly reviewed patient's old medical records in [place]  Chief Complaint: Dizziness, passing out  HPI: Dakota Bryant is a 51 y.o. male with medical history significant for lung cancer, colostomy status, depression, alcohol abuse. 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  He has been standing for about 5 minutes before this happened.  He said he did not actually lose consciousness, and did not hit his head.  Over the past several days, he has had poor oral intake due to nausea, reports no bowel movements for up to 5 days and subsequently able to have a bowel movement.  Last bowel movement was yesterday and was very small in amount.  Reports he has been leaking some feces from his rectum and having to wear a diaper.  No vomiting no abdominal pain.  Patient had colonoscopy 11/29, and subsequently over the past week he has been having low back pain.  Back pain does not radiate down his legs.  He denies history of prior back pains.  Denies falls or trauma to his back. He has had intermittent episodes of dizziness over the past few days only when standing. Reports some mild intermittent headaches.  Some confusion during his episode of syncope yesterday otherwise none since.  ED Course: Stable vitals.  Blood pressure systolic 563-875.  Heart rate 85-95.  Sodium 120.  Potassium 3.6. 1 L bolus given.  Hospitalist to admit for hyponatremia.  Review of Systems: As per HPI all other systems reviewed and negative.  Past Medical History:  Diagnosis Date   Acid reflux    Arthritis    Colostomy in place [facility])    COVID 2021   moderate symptoms   Depression    in his thirties   Emphysema lung ([place])    pt not aware of this   Headache    in his twenties   Hypertension     Mass of left lung    Perforated diverticulum    PONV (postoperative nausea and vomiting)    Small cell lung cancer [facility])     Past Surgical History:  Procedure Laterality Date   BIOPSY  02/15/2022   Procedure: BIOPSY;  Surgeon: Harvel Quale, MD;  [facility];  Service: Gastroenterology;;   BRONCHIAL BRUSHINGS  07/26/2021   Procedure: BRONCHIAL BRUSHINGS;  Surgeon: Margaretha Seeds, MD;  Location: [facility];  Service: Cardiopulmonary;;   BRONCHIAL WASHINGS  07/26/2021   Procedure: BRONCHIAL WASHINGS;  Surgeon: Margaretha Seeds, MD;  Location: [facility];  Service: Cardiopulmonary;;   COLONOSCOPY WITH PROPOFOL N/A 02/15/2022   Procedure: COLONOSCOPY WITH PROPOFOL;  Surgeon: Harvel Quale, MD;  [facility];  Service: Gastroenterology;  Laterality: N/A;  200 ASA 2   COLOSTOMY N/A 06/03/2021   Procedure: SIGMOID COLECTOMY WITH COLOSTOMY;  Surgeon: Virl Cagey, MD;  Location: [facility];  Service: General;  Laterality: N/A;   ENDOBRONCHIAL ULTRASOUND Bilateral 07/26/2021   Procedure: ENDOBRONCHIAL ULTRASOUND;  Surgeon: Margaretha Seeds, MD;  Location: [facility];  Service: Cardiopulmonary;  Laterality: Bilateral;   ESOPHAGEAL DILATION N/A 02/15/2022   Procedure: ESOPHAGEAL DILATION;  Surgeon: Harvel Quale, MD;  [facility];  Service: Gastroenterology;  Laterality: N/A;   ESOPHAGOGASTRODUODENOSCOPY (EGD) WITH PROPOFOL N/A 02/15/2022  Procedure: ESOPHAGOGASTRODUODENOSCOPY (EGD) WITH PROPOFOL;  Surgeon: Harvel Quale, MD;  [facility];  Service: Gastroenterology;  Laterality: N/A;   EXCISION OF TONGUE LESION N/A 01/25/2022   Procedure: EXCISIONAL BIOPSY OF TONGUE LESION;  Surgeon: [name], MD;  Location: [place];  Service: ENT;  Laterality: N/A;   FINE NEEDLE ASPIRATION  07/26/2021   Procedure: FINE NEEDLE ASPIRATION;  Surgeon: Margaretha Seeds, MD;  [facility];  Service:  Cardiopulmonary;;   HAND TENDON SURGERY     IR IMAGING GUIDED PORT INSERTION  08/10/2021   KNEE ARTHROSCOPY Left    POLYPECTOMY  02/15/2022   Procedure: POLYPECTOMY;  Surgeon: Harvel Quale, MD;  [facility];  Service: Gastroenterology;;   VIDEO BRONCHOSCOPY  07/26/2021   Procedure: VIDEO BRONCHOSCOPY WITHOUT FLUORO;  Surgeon: Margaretha Seeds, MD;  [facility];  Service: Cardiopulmonary;;     reports that he has been smoking cigarettes. He started smoking about 40 years ago. He has a 39.00 pack-year smoking history. He has been exposed to tobacco smoke. He has never used smokeless tobacco. He reports that he does not currently use alcohol after a past usage of about 2.0 standard drinks of alcohol per week. He reports current drug use. Frequency: 3.00 times per week. Drug: Marijuana.  No Known Allergies  Prior to Admission medications   Medication Sig Start Date End Date Taking? Authorizing Provider  amLODipine (NORVASC) 10 MG tablet Take 1 tablet by mouth once daily 02/16/22  Yes Orson Slick, MD  cetirizine (ZYRTEC) 10 MG tablet Take 1 tablet by mouth once daily 02/16/22  Yes Ledell Peoples IV, MD  Coenzyme Q10 (CO Q-10) 50 MG CAPS Take 50 mg by mouth daily.   Yes [provider]  esomeprazole (NEXIUM) 20 MG capsule Take 20 mg by mouth daily at 12 noon.   Yes [provider]  HYDROcodone-acetaminophen (NORCO) 7.5-325 MG tablet Take 1 tablet by mouth every 6 (six) hours as needed for moderate pain. 01/25/22  Yes [name], MD  labetalol (NORMODYNE) 200 MG tablet Take 1 tablet by mouth twice daily 02/16/22  Yes Orson Slick, MD  Naphazoline-Glycerin-Zinc Sulf (CLEAR EYES MAXIMUM ITCHY EYE) 0.012-0.25-0.25 % SOLN Place 1 drop into both eyes 2 (two) times daily.   Yes [provider]  oxymetazoline (AFRIN) 0.05 % nasal spray Place 1 spray into both nostrils 2 (two) times daily. Equate No drip   Yes [provider]   Sennosides (LAXATIVE) 25 MG TABS Take 25 mg by mouth daily as needed (Constipation). Equate Max. strength   Yes [provider]  tamsulosin (FLOMAX) 0.4 MG CAPS capsule Take 1 capsule (0.4 mg total) by mouth daily. 01/03/22  Yes Orson Slick, MD  traZODone (DESYREL) 50 MG tablet Take 1 tablet (50 mg total) by mouth at bedtime. Patient taking differently: Take 25-50 mg by mouth at bedtime. 10/20/21  Yes Orson Slick, MD  ondansetron (ZOFRAN) 8 MG tablet Take 1 tablet (8 mg total) by mouth every 8 (eight) hours as needed. Patient not taking: Reported on 02/14/2022 08/02/21   Orson Slick, MD  ondansetron (ZOFRAN-ODT) 4 MG disintegrating tablet Take 1 tablet (4 mg total) by mouth every 8 (eight) hours as needed for nausea or vomiting. Patient not taking: Reported on 02/27/2022 01/25/22   [name], MD  senna-docusate (SENNA S) 8.6-50 MG tablet Take 1 tablet by mouth daily. May increase to 2 daily at bedtime  as needed Patient taking differently: Take 1 tablet by mouth daily as needed for mild constipation. May increase to 2 daily at bedtime as needed 09/30/21   Orson Slick, MD    Physical Exam: Vitals:   02/27/22 1345 02/27/22 1400 02/27/22 1430 02/27/22 1500  BP: (!) 142/92 (!) 124/94 (!) 131/90 131/86  Pulse: 92 93 85 95  Resp: 11 19 17 14   Temp:      TempSrc:      SpO2: 100% 97% 99% 100%  Weight:      Height:        Constitutional: NAD, calm, comfortable Vitals:   02/27/22 1345 02/27/22 1400 02/27/22 1430 02/27/22 1500  BP: (!) 142/92 (!) 124/94 (!) 131/90 131/86  Pulse: 92 93 85 95  Resp: 11 19 17 14   Temp:      TempSrc:      SpO2: 100% 97% 99% 100%  Weight:      Height:       Eyes: PERRL, lids and conjunctivae normal ENMT: Mucous membranes are dry.  Neck: normal, supple, no masses, no thyromegaly Respiratory: clear to auscultation bilaterally, no wheezing, no crackles. Normal respiratory effort. No accessory muscle use.  Cardiovascular: Regular  rate and rhythm, no murmurs / rubs / gallops. No extremity edema.  Extremities warm..  Port-A-Cath right upper chest. Abdomen: no tenderness, not distended, no masses palpated. No hepatosplenomegaly.   Ostomy left side of abdomen. Musculoskeletal: no clubbing / cyanosis. No joint deformity upper and lower extremities.  No  tenderness, on palpation of his back, no step-off. Skin: no rashes, lesions, ulcers. No induration Neurologic: 4+/5 equal strength in all extremities. Psychiatric: Normal judgment and insight. Alert and oriented x 3. Normal mood.   Labs on Admission: I have personally reviewed following labs and imaging studies  CBC: Recent Labs  Lab 02/27/22 1355  WBC 6.5  HGB 14.9  HCT 41.2  MCV 88.4  PLT 416   Basic Metabolic Panel: Recent Labs  Lab 02/27/22 1355  NA 120*  K 3.6  CL 87*  CO2 21*  GLUCOSE 91  BUN 10  CREATININE 0.64  CALCIUM 8.9    CBG: Recent Labs  Lab 02/27/22 1412  GLUCAP 90   Radiological Exams on Admission: DG Chest Port 1 View  Result Date: 02/27/2022 CLINICAL DATA:  Provided history: Weakness. EXAM: PORTABLE CHEST 1 VIEW COMPARISON:  Prior chest radiographs 06/07/2021 and earlier. Chest CT 02/21/2022. FINDINGS: Right chest infusion port catheter with tip terminating at the level of the superior cavoatrial junction. Heart size within normal limits. Pulmonary parenchymal changes within the medial right upper lobe, better characterized on the recent prior chest CT of 02/21/2022. No appreciable airspace consolidation elsewhere. No evidence of pleural effusion or pneumothorax. Degenerative changes of the spine. IMPRESSION: Pulmonary parenchymal changes within the medial right upper lobe, better characterized on the recent prior chest CT of 02/21/2022. Please refer to this prior report for further description. Elsewhere, there is no appreciable airspace consolidation. Electronically Signed   By: Kellie Simmering D.O.   On: 02/27/2022 14:58    EKG:  Independently reviewed.  Sinus rhythm, rate 83, QTc 419.  No significant change from prior.  Assessment/Plan Principal Problem:   Hyponatremia Active Problems:   Port-A-Cath in place   Small cell lung cancer, left upper lobe (HCC)   Alcohol abuse   Back pain   Pre-syncope   Assessment and Plan: * Hyponatremia Sodium 120 today. Over the past 6 months, sodium has ranged widely  from 121-136.  Prior to this baseline was in the mid 130s.  Alcohol history, but he denies any alcohol like beverage in the past year. History points to prerenal etiology, but will pursue further workup.  Presyncopal episode, likely from dehydration rather than hyponatremia. -Urine sodium, urine osmolality -Serum osmolality -1 L bolus given, continue N/s + 20 kcl 100cc/hr X 1 day - K 3.6 -Mag and Phos  Pre-syncope With orthostatic dizziness, likely due to dehydration from recent poor oral intake.  EKG without significant changes. -Hydrate -Orthostatic vitals -Obtain echo  Back pain Recent colonoscopy 11/29-evaluating for colostomy reversal.  He has since developed new back pain, constipation, nausea inability to eat, and reports feces is leaking from his rectum. -CT abdomen and pelvis with contrast - If no explanation for backpain, may need to consider MRI of spine considering lung cancer history.   Alcohol abuse Denies any alcoholic beverage in at least 1 year.  He was admitted 05/2021 for sepsis 2/2 diverticulitis, was also managed for alcohol abuse with withdrawal-required Precedex drip and Librium detox protocol, in addition to management for his sepsis secondary to acute sigmoid diverticulitis. -CIWA as needed -Check mag, Phos  Small cell lung cancer, left upper lobe (HCC) Stage IIIb.  Diagnosed 05/2021.  Follows with Dr. Lorenso Courier.  Status post completion of 4 cycles of chemotherapy and radiation therapy.  Currently not receiving treatment. - Had excision of tongue mass 11/8 by Dr. 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  No high-grade dysplasia or malignancy.   DVT prophylaxis: Lovenox Code Status: Full code Family Communication: None at bedside Disposition Plan: ~ 2 days Consults called: none Admission status: obs tele   Author: Bethena Roys, MD 02/27/2022 5:50 PM  For on call review [email or web address].

## 2022-02-27 NOTE — ED Triage Notes (Signed)
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 Pt has had decreased appetite with little PO intake. Pt reports mid back pain for several days.

## 2022-02-27 NOTE — Assessment & Plan Note (Addendum)
With orthostatic dizziness, likely due to dehydration from recent poor oral intake.  EKG without significant changes. -Hydrate -Orthostatic vitals -Obtain echo

## 2022-02-27 NOTE — Telephone Encounter (Signed)
Agree with this I see he has a script for Zofran 8 mg as needed, should try to use this as well.

## 2022-02-27 NOTE — Assessment & Plan Note (Addendum)
Sodium 120 today. Over the past 6 months, sodium has ranged widely from 121-136.  Prior to this baseline was in the mid 130s.  Alcohol history, but he denies any alcohol like beverage in the past year. History points to prerenal etiology, but will pursue further workup.  Presyncopal episode, likely from dehydration rather than hyponatremia. -Urine sodium, urine osmolality -Serum osmolality -1 L bolus given, continue N/s + 20 kcl 100cc/hr X 1 day - K 3.6 -Mag and Phos

## 2022-02-28 ENCOUNTER — Observation Stay (HOSPITAL_BASED_OUTPATIENT_CLINIC_OR_DEPARTMENT_OTHER): Payer: 59

## 2022-02-28 ENCOUNTER — Other Ambulatory Visit: Payer: Self-pay | Admitting: Gastroenterology

## 2022-02-28 DIAGNOSIS — F1010 Alcohol abuse, uncomplicated: Secondary | ICD-10-CM | POA: Diagnosis not present

## 2022-02-28 DIAGNOSIS — R531 Weakness: Secondary | ICD-10-CM

## 2022-02-28 DIAGNOSIS — R69 Illness, unspecified: Secondary | ICD-10-CM | POA: Diagnosis not present

## 2022-02-28 DIAGNOSIS — M545 Low back pain, unspecified: Secondary | ICD-10-CM | POA: Diagnosis not present

## 2022-02-28 DIAGNOSIS — C3412 Malignant neoplasm of upper lobe, left bronchus or lung: Secondary | ICD-10-CM

## 2022-02-28 DIAGNOSIS — Z95828 Presence of other vascular implants and grafts: Secondary | ICD-10-CM

## 2022-02-28 DIAGNOSIS — R55 Syncope and collapse: Secondary | ICD-10-CM

## 2022-02-28 DIAGNOSIS — E871 Hypo-osmolality and hyponatremia: Secondary | ICD-10-CM | POA: Diagnosis not present

## 2022-02-28 LAB — ECHOCARDIOGRAM COMPLETE
AR max vel: 2.15 cm2
AV Area VTI: 2.26 cm2
AV Area mean vel: 2.27 cm2
AV Mean grad: 5 mmHg
AV Peak grad: 11.7 mmHg
Ao pk vel: 1.71 m/s
Area-P 1/2: 3.24 cm2
Height: 66 in
MV VTI: 2.49 cm2
S' Lateral: 2.5 cm
Weight: 1883.61 oz

## 2022-02-28 LAB — BASIC METABOLIC PANEL
Anion gap: 9 (ref 5–15)
BUN: 8 mg/dL (ref 6–20)
CO2: 22 mmol/L (ref 22–32)
Calcium: 8.3 mg/dL — ABNORMAL LOW (ref 8.9–10.3)
Chloride: 93 mmol/L — ABNORMAL LOW (ref 98–111)
Creatinine, Ser: 0.61 mg/dL (ref 0.61–1.24)
GFR, Estimated: >60 mL/min (ref >60)
Glucose, Bld: 73 mg/dL (ref 70–99)
Potassium: 3.6 mmol/L (ref 3.5–5.1)
Sodium: 124 mmol/L — ABNORMAL LOW (ref 135–145)

## 2022-02-28 LAB — CBC
HCT: 35 % — ABNORMAL LOW (ref 39.0–52.0)
Hemoglobin: 12.7 g/dL — ABNORMAL LOW (ref 13.0–17.0)
MCH: 32.4 pg (ref 26.0–34.0)
MCHC: 36.3 g/dL — ABNORMAL HIGH (ref 30.0–36.0)
MCV: 89.3 fL (ref 80.0–100.0)
Platelets: 198 10*3/uL (ref 150–400)
RBC: 3.92 MIL/uL — ABNORMAL LOW (ref 4.22–5.81)
RDW: 12 % (ref 11.5–15.5)
WBC: 4.3 10*3/uL (ref 4.0–10.5)
nRBC: 0 % (ref 0.0–0.2)

## 2022-02-28 LAB — OSMOLALITY: Osmolality: 256 mOsm/kg — ABNORMAL LOW (ref 275–295)

## 2022-02-28 LAB — OSMOLALITY, URINE: Osmolality, Ur: 452 mOsm/kg (ref 300–900)

## 2022-02-28 MED ORDER — TRAZODONE HCL 50 MG PO TABS: 25.0000 mg | ORAL_TABLET | Freq: Every day | ORAL | Status: AC

## 2022-02-28 MED ORDER — ONDANSETRON HCL 8 MG PO TABS: 8.0000 mg | ORAL_TABLET | Freq: Two times a day (BID) | ORAL | 2 refills | Status: AC | PRN

## 2022-02-28 MED ORDER — SODIUM CHLORIDE 1 G PO TABS: 1.0000 g | ORAL_TABLET | Freq: Every day | ORAL | 2 refills | Status: AC

## 2022-02-28 NOTE — Telephone Encounter (Signed)
Discussed with patient he can take zofran 8mg  and he reports he does not have rx and requested it be sent to [facility]

## 2022-02-28 NOTE — Telephone Encounter (Signed)
Patient currently admitted to hospital will discuss with patient when he is discharged.

## 2022-02-28 NOTE — Telephone Encounter (Signed)
Tried to call no answer to let pt know med was sent to pharmacy.

## 2022-02-28 NOTE — Assessment & Plan Note (Signed)
-  No signs of acute infection appreciated -Continue outpatient follow-up with oncology service.

## 2022-02-28 NOTE — Progress Notes (Signed)
IV removed and dc instructions reviewed.  Script for sodium sent to pharmacy.  Transported by [facility] to main entrance and friend to give ride home

## 2022-02-28 NOTE — Progress Notes (Signed)
*  PRELIMINARY RESULTS* Echocardiogram 2D Echocardiogram has been performed.  Dakota Bryant 02/28/2022, 12:00 PM

## 2022-02-28 NOTE — Discharge Summary (Signed)
Physician Discharge Summary   Patient: Dakota Bryant MRN: [id number] DOB: June 06, 1970  Admit date:     02/27/2022  Discharge date: 02/28/22  Discharge Physician: Barton Dubois   PCP: Pcp, No   Recommendations at discharge:  Repeat basic metabolic panel to follow electrolytes and renal function Reassess blood pressure and adjust antihypertensive treatment as needed Make sure patient has continued to follow-up with oncology service as previously arranged.  Discharge Diagnoses: Principal Problem:   Hyponatremia Active Problems:   Port-A-Cath in place   Small cell lung cancer, left upper lobe (HCC)   Alcohol abuse   Back pain   Pre-syncope   Hospital Course: As per H&P written by Dr. Denton Brick on 02/27/2022 Dakota Bryant is a 51 y.o. male with medical history significant for lung cancer, colostomy status, depression, alcohol abuse. 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  He has been standing for about 5 minutes before this happened.  He said he did not actually lose consciousness, and did not hit his head.  Over the past several days, he has had poor oral intake due to nausea, reports no bowel movements for up to 5 days and subsequently able to have a bowel movement.  Last bowel movement was yesterday and was very small in amount.  Reports he has been leaking some feces from his rectum and having to wear a diaper.  No vomiting no abdominal pain.  Patient had colonoscopy 11/29, and subsequently over the past week he has been having low back pain.  Back pain does not radiate down his legs.  He denies history of prior back pains.  Denies falls or trauma to his back. He has had intermittent episodes of dizziness over the past few days only when standing. Reports some mild intermittent headaches.  Some confusion during his episode of syncope yesterday otherwise none since.   ED Course: Stable vitals.  Blood pressure  systolic 546-568.  Heart rate 85-95.  Sodium 120.  Potassium 3.6. 1 L bolus given.  Hospitalist to admit for hyponatremia.  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 -Adequate hydration has been discussed; patient also with component of prior chronic history of alcohol abuse. -After fluid resuscitation sodium 124-125 -Daily sodium tablets recommended -Basic metabolic panel as an outpatient to follow electrolytes trend/stability discussed.  Pre-syncope -Patient orthostatic vital signs at discharge were within normal limits after fluid resuscitation -Advised to maintain adequate hydration -Daily sodium tablets has been recommended -No EKG or telemetry normalities appreciated. -2D echo demonstrated preserved ejection fraction and no wall motion abnormalities.  Back pain -Recent colonoscopy 11/29-evaluating for colostomy reversal.  He has since developed new back pain, constipation and nausea reporting some inability to eat. -CT abdomen and pelvis demonstrating no acute abnormalities -Continue efforts to maintain adequate hydration and increase oral intake -Continue outpatient follow-up with gastroenterology service.    Alcohol abuse -Denies any alcoholic beverage in at least 1 year.   -He was admitted 05/2021 for sepsis 2/2 diverticulitis, was also managed for alcohol abuse with withdrawal-required Precedex drip and Librium detox protocol, in addition to management for his sepsis secondary to acute sigmoid diverticulitis. -Magnesium and phosphorus stable -Patient encouraged to maintain complete abstinence -Continue adequate hydration.  Small cell lung cancer, left upper lobe ([facility]) -  Stage IIIb.  Diagnosed 05/2021.  Follows with Dr. Lorenso Courier.  Status post completion of 4 cycles of chemotherapy and  radiation therapy.   -Currently not receiving treatment. - Had excision of tongue mass 11/8 by Dr. 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  No high-grade dysplasia or malignancy. -Continue patient follow-up with oncology service.  Port-A-Cath in place -No signs of acute infection appreciated -Continue outpatient follow-up with oncology service.   Consultants: None Procedures performed: See below for x-ray reports. Disposition: Home Diet recommendation: Regular diet.  DISCHARGE MEDICATION: Allergies as of 02/28/2022   No Known Allergies      Medication List     STOP taking these medications    ondansetron 4 MG disintegrating tablet Commonly known as: ZOFRAN-ODT   ondansetron 8 MG tablet Commonly known as: ZOFRAN       TAKE these medications    amLODipine 10 MG tablet Commonly known as: NORVASC Take 1 tablet by mouth once daily   cetirizine 10 MG tablet Commonly known as: ZYRTEC Take 1 tablet by mouth once daily   Clear Eyes Maximum Itchy Eye 0.012-0.25-0.25 % Soln Generic drug: Naphazoline-Glycerin-Zinc Sulf Place 1 drop into both eyes 2 (two) times daily.   Co Q-10 50 MG Caps Take 50 mg by mouth daily.   esomeprazole 20 MG capsule Commonly known as: NEXIUM Take 20 mg by mouth daily at 12 noon.   HYDROcodone-acetaminophen 7.5-325 MG tablet Commonly known as: Norco Take 1 tablet by mouth every 6 (six) hours as needed for moderate pain.   labetalol 200 MG tablet Commonly known as: NORMODYNE Take 1 tablet by mouth twice daily   Laxative 25 MG Tabs Generic drug: Sennosides Take 25 mg by mouth daily as needed (Constipation). Equate Max. strength   oxymetazoline 0.05 % nasal spray Commonly known as: AFRIN Place 1 spray into both nostrils 2 (two) times daily. Equate No drip   senna-docusate 8.6-50 MG tablet Commonly known as: Senna S Take 1 tablet by mouth daily. May increase to 2 daily at bedtime as  needed What changed:  when to take this reasons to take this   sodium chloride 1 g tablet Take 1 tablet (1 g total) by mouth daily.   tamsulosin 0.4 MG Caps capsule Commonly known as: FLOMAX Take 1 capsule (0.4 mg total) by mouth daily.   traZODone 50 MG tablet Commonly known as: DESYREL Take 0.5-1 tablets (25-50 mg total) by mouth at bedtime.        Discharge Exam: Filed Weights   02/27/22 1335 02/27/22 2118  Weight: 59 kg 53.4 kg   General exam: Alert, awake, oriented x 3 Respiratory system: Clear to auscultation. Respiratory effort normal. Cardiovascular system:RRR. No rubs or gallops; no JVD. Gastrointestinal system: Abdomen is nondistended, soft and nontender.  Colostomy in place. Central nervous system: Alert and oriented. No focal neurological deficits. Extremities: No cyanosis or clubbing. Skin: No petechiae. Psychiatry: Judgement and insight appear normal. Mood & affect appropriate.    Condition at discharge: Stable and improved.  The results of significant diagnostics from this hospitalization (including imaging, microbiology, ancillary and laboratory) are listed below for reference.   Imaging Studies: CT ABDOMEN PELVIS W CONTRAST  Result Date: 02/27/2022 CLINICAL DATA:  History of lung cancer, decreased output from colostomy, initial encounter EXAM: CT ABDOMEN AND PELVIS WITH CONTRAST TECHNIQUE: Multidetector CT imaging of the abdomen and pelvis was performed using the standard protocol following bolus administration of intravenous contrast. RADIATION DOSE REDUCTION: This exam was performed  according to the departmental dose-optimization program which includes automated exposure control, adjustment of the mA and/or kV according to patient size and/or use of iterative reconstruction technique. CONTRAST:  22mL OMNIPAQUE IOHEXOL 300 MG/ML  SOLN COMPARISON:  02/21/2022 FINDINGS: Lower chest: Mild scarring is noted in the left lung base. Hepatobiliary: Liver again  demonstrates multiple too numerous to count hypodensities consistent with increased metastatic disease. The overall appearance is stable from the exam from 1 week previous. Gallbladder is well distended. Mild pericholecystic fluid is noted stable from the prior study. Pancreas: Unremarkable. No pancreatic ductal dilatation or surrounding inflammatory changes. Spleen: Normal in size without focal abnormality. Adrenals/Urinary Tract: Adrenal glands are within normal limits. Kidneys demonstrate a normal enhancement pattern bilaterally. Early staghorn calculus is noted in the lower pole of the left kidney measuring up to 13 mm. The bladder is well distended. Stomach/Bowel: Hartmann's pouch is noted without focal abnormality. Left-sided colostomy is seen. The colon shows no obstructive or inflammatory changes. Appendix is not well visualized. No inflammatory changes are seen. Scattered air is noted throughout the stomach and small bowel without definitive obstructive change. Vascular/Lymphatic: Atherosclerotic calcifications are noted. No aneurysmal dilatation of the aorta is seen. Moderate narrowing in the proximal right common iliac artery is noted. This is similar to that seen on the prior exam. No focal adenopathy is noted. Reproductive: Uterus and bilateral adnexa are unremarkable. Other: No abdominal wall hernia or abnormality. No abdominopelvic ascites. Musculoskeletal: No acute or significant osseous findings. IMPRESSION: Changes consistent with left-sided colostomy. No obstructive changes are identified to correspond with the given clinical history. 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 Hepatic metastatic disease is noted. This appears slightly progressed when compared with the prior study. Electronically Signed   By: Inez Catalina M.D.   On: 02/27/2022 19:30   DG Chest Port 1 View  Result Date: 02/27/2022 CLINICAL DATA:  Provided history: Weakness. EXAM: PORTABLE CHEST 1 VIEW COMPARISON:  Prior chest  radiographs 06/07/2021 and earlier. Chest CT 02/21/2022. FINDINGS: Right chest infusion port catheter with tip terminating at the level of the superior cavoatrial junction. Heart size within normal limits. Pulmonary parenchymal changes within the medial right upper lobe, better characterized on the recent prior chest CT of 02/21/2022. No appreciable airspace consolidation elsewhere. No evidence of pleural effusion or pneumothorax. Degenerative changes of the spine. IMPRESSION: Pulmonary parenchymal changes within the medial right upper lobe, better characterized on the recent prior chest CT of 02/21/2022. Please refer to this prior report for further description. Elsewhere, there is no appreciable airspace consolidation. Electronically Signed   By: Kellie Simmering D.O.   On: 02/27/2022 14:58   CT CHEST ABDOMEN PELVIS W CONTRAST  Result Date: 02/22/2022 CLINICAL DATA:  Small cell lung cancer. Evaluate for takedown of colostomy. EXAM: CT CHEST, ABDOMEN, AND PELVIS WITH CONTRAST TECHNIQUE: Multidetector CT imaging of the chest, abdomen and pelvis was performed following the standard protocol during bolus administration of intravenous contrast. RADIATION DOSE REDUCTION: This exam was performed according to the departmental dose-optimization program which includes automated exposure control, adjustment of the mA and/or kV according to patient size and/or use of iterative reconstruction technique. CONTRAST:  127mL OMNIPAQUE IOHEXOL 300 MG/ML  SOLN COMPARISON:  CT 11/22/2021 FINDINGS: CT CHEST FINDINGS Cardiovascular: Port in the anterior chest wall with tip in distal SVC. Mediastinum/Nodes: No axillary or supraclavicular adenopathy. No mediastinal or hilar adenopathy. No pericardial fluid. Esophagus normal. Lungs/Pleura: Ground-glass densities in peribronchial thickening in the anterior RIGHT upper lobe (image 50/4) is increased  from comparison exam. No new pulmonary nodularity otherwise. Pleural fluid. Musculoskeletal:  No aggressive osseous lesion. CT ABDOMEN AND PELVIS FINDINGS Hepatobiliary: Multiple new hypoenhancing round lesions within LEFT and RIGHT hepatic lobe. Approximately 12 lesions per lobe. Example lesion in the RIGHT hepatic lobe measuring 20 mm on image 52/2. Example lesion LEFT hepatic lobe measuring 8 mm on image 70/2. Gallbladder is collapsed. Pancreas: Pancreas is normal. No ductal dilatation. No pancreatic inflammation. Spleen: Normal spleen Adrenals/urinary tract: Adrenal glands and kidneys are normal. The ureters and bladder normal. Stomach/Bowel: Stomach, duodenum normal. Loops of small bowel fluid-filled nondistended. Loops of small bowel extend into the deep pelvis superior to the rectal stump. Terminal ileum is normal. There is stool in the ascending colon. LEFT upper abdominal wall colostomy without complication. Excluded rectosigmoid colon is normal. Vascular/Lymphatic: Abdominal aorta is normal caliber. There is no retroperitoneal or periportal lymphadenopathy. No pelvic lymphadenopathy. Reproductive: Prostate unremarkable. Other: No intraperitoneal free fluid. Musculoskeletal: No aggressive osseous lesion. IMPRESSION: Chest Impression: 1. No evidence of thoracic metastasis recurrence. 2. Increased ground-glass density and peribronchial thickening in the RIGHT upper lobe is favored related to radiation change or pulmonary infection. Abdomen / Pelvis Impression: 1. Unfortunately, there are new multifocal HEPATIC METASTASIS. Approximately 20 to 30 lesions within the liver. 2. LEFT colostomy without complication. Rectal stump appears normal. Electronically Signed   By: Suzy Bouchard M.D.   On: 02/22/2022 15:36    Microbiology: Results for orders placed or performed during the hospital encounter of 07/26/21  Fungus Culture With Stain     Status: None   Collection Time: 07/26/21  1:34 PM   Specimen: Bronchial Alveolar Lavage; Respiratory  Result Value Ref Range Status   Fungus Stain Final report   Final   Fungus (Mycology) Culture Final report  Final    Comment: (NOTE) Performed At: [facility] [place], [place] [id number] Rush Farmer MD VO:[id number]    Fungal Source BRONCHIAL ALVEOLAR LAVAGE  Final    Comment: Performed at [facility] [address], [place], [place] [address]  Culture, Respiratory w Gram Stain     Status: None   Collection Time: 07/26/21  1:34 PM   Specimen: Bronchial Alveolar Lavage; Respiratory  Result Value Ref Range Status   Specimen Description   Final    BRONCHIAL ALVEOLAR LAVAGE Performed at [facility] [address], [place], [place] [address]    Special Requests   Final    NONE Performed at [facility], [place] [address] [place], [place] [address]    Gram Stain NO WBC SEEN NO ORGANISMS SEEN   Final   Culture   Final    Normal respiratory flora-no Staph aureus or Pseudomonas seen Performed at [facility], [place] [address], [place], [place] [address]    Report Status 07/28/2021 FINAL  Final  Fungus Stain     Status: None   Collection Time: 07/26/21  1:34 PM   Specimen: Bronchial Alveolar Lavage; Respiratory  Result Value Ref Range Status   FUNGUS STAIN DUPLICATE  Final    Comment: (NOTE) Duplicate procedure ordered. Please refer to the following specimen for additional lab results.      [phone] Performed At: [facility] [place], [place] [id number] Rush Farmer MD DU:[id number]    Fungal Source BRONCHIAL ALVEOLAR LAVAGE  Final    Comment: Performed at [facility] [address] [place], [place] [address]  Acid Fast Culture with reflexed  sensitivities     Status: None   Collection Time: 07/26/21  1:34 PM   Specimen: Bronchial Alveolar Lavage; Respiratory  Result Value Ref Range Status   Acid Fast Culture Negative  Final    Comment: (NOTE) No acid fast bacilli isolated after 6  weeks. Performed At: [facility] [place], [place] [id number] Rush Farmer MD WU:[id number]    Source of Sample BRONCHIAL ALVEOLAR LAVAGE  Final    Comment: Performed at [facility] [address] [place], [place] [address]  Acid Fast Smear (AFB)     Status: None   Collection Time: 07/26/21  1:34 PM   Specimen: Bronchial Alveolar Lavage; Respiratory  Result Value Ref Range Status   AFB Specimen Processing Concentration  Final   Acid Fast Smear Negative  Final    Comment: (NOTE) Performed At: [facility] [place], [place] [id number] Rush Farmer MD [id number]    Source (AFB) BRONCHIAL ALVEOLAR LAVAGE  Final    Comment: Performed at [facility] [address], [place], [place] [address]  Fungus Culture Result     Status: None   Collection Time: 07/26/21  1:34 PM  Result Value Ref Range Status   Result 1 Comment  Final    Comment: (NOTE) KOH/Calcofluor preparation:  no fungus observed. Performed At: [facility] [place], [place] [id number] Rush Farmer MD [id number]   Fungal organism reflex     Status: None   Collection Time: 07/26/21  1:34 PM  Result Value Ref Range Status   Fungal result 1 Comment  Final    Comment: (NOTE) No yeast or mold isolated after 4 weeks. Performed At: [facility] [place], [place] [id number] Rush Farmer MD OA:[id number]     Labs: CBC: Recent Labs  Lab 02/27/22 1355 02/28/22 0317  WBC 6.5 4.3  HGB 14.9 12.7*  HCT 41.2 35.0*  MCV 88.4 89.3  PLT 214 010   Basic Metabolic Panel: Recent Labs  Lab 02/27/22 1355 02/28/22 0317  NA 120* 124*  K 3.6 3.6  CL 87* 93*  CO2 21* 22  GLUCOSE 91 73  BUN 10 8  CREATININE 0.64 0.61  CALCIUM 8.9 8.3*  MG 1.9  --   PHOS 1.3*  --    CBG: Recent Labs  Lab 02/27/22 1412  GLUCAP 90    Discharge time spent: greater than 30  minutes.  Signed: Barton Dubois, MD [facility] 02/28/2022

## 2022-02-28 NOTE — Progress Notes (Signed)
Ortho vitals showed no hypotension and denied dizziness.  Ostomy in place and said he did not need miralax and senokot.

## 2022-02-28 NOTE — Telephone Encounter (Signed)
Medication sent to pharmacy  

## 2022-03-01 ENCOUNTER — Inpatient Hospital Stay: Payer: 59 | Attending: Hematology and Oncology

## 2022-03-01 ENCOUNTER — Other Ambulatory Visit: Payer: Self-pay

## 2022-03-01 ENCOUNTER — Other Ambulatory Visit: Payer: Self-pay | Admitting: *Deleted

## 2022-03-01 ENCOUNTER — Inpatient Hospital Stay (HOSPITAL_BASED_OUTPATIENT_CLINIC_OR_DEPARTMENT_OTHER): Payer: 59 | Admitting: Hematology and Oncology

## 2022-03-01 ENCOUNTER — Other Ambulatory Visit: Payer: Self-pay | Admitting: Hematology and Oncology

## 2022-03-01 VITALS — BP 105/75 | HR 78 | Temp 98.1°F | Resp 15 | Wt 120.2 lb

## 2022-03-01 DIAGNOSIS — C787 Secondary malignant neoplasm of liver and intrahepatic bile duct: Secondary | ICD-10-CM | POA: Diagnosis not present

## 2022-03-01 DIAGNOSIS — C3490 Malignant neoplasm of unspecified part of unspecified bronchus or lung: Secondary | ICD-10-CM

## 2022-03-01 DIAGNOSIS — C3432 Malignant neoplasm of lower lobe, left bronchus or lung: Secondary | ICD-10-CM

## 2022-03-01 DIAGNOSIS — Z95828 Presence of other vascular implants and grafts: Secondary | ICD-10-CM

## 2022-03-01 MED ORDER — ALPRAZOLAM 0.5 MG PO TABS: 1.0000 mg | ORAL_TABLET | Freq: Three times a day (TID) | ORAL | 0 refills | Status: AC | PRN

## 2022-03-01 MED ORDER — SENNOSIDES-DOCUSATE SODIUM 8.6-50 MG PO TABS: 1.0000 | ORAL_TABLET | Freq: Every day | ORAL | 2 refills | Status: AC

## 2022-03-01 MED ORDER — TRAMADOL HCL 50 MG PO TABS: 50.0000 mg | ORAL_TABLET | Freq: Four times a day (QID) | ORAL | 0 refills | Status: AC | PRN

## 2022-03-01 NOTE — Progress Notes (Deleted)
ON PATHWAY REGIMEN - Small Cell Lung  No Change  Continue With Treatment as Ordered.  Original Decision Date/Time: 08/03/2021 16:16     A cycle is every 21 days:     Etoposide      Cisplatin   **Always confirm dose/schedule in your pharmacy ordering system**  Patient Characteristics: Newly Diagnosed, Preoperative or Nonsurgical Candidate (Clinical Staging), First Line, Limited Stage, Nonsurgical Candidate Therapeutic Status: Newly Diagnosed, Preoperative or Nonsurgical Candidate (Clinical Staging) AJCC T Category: cTX AJCC N Category: cNX AJCC M Category: cM0 AJCC 8 Stage Grouping: IIIB Stage Classification: Limited Surgical Candidacy: Nonsurgical Candidate Intent of Therapy: Non-Curative / Palliative Intent, Discussed with Patient

## 2022-03-01 NOTE — Telephone Encounter (Signed)
I called pt to let him know zofran had been sent in and then he told me he found the one at home but reports he was told not to take when he left hospital. He has prochlorperazole 10mg  that oncologist prescribed that he has not taken but reports it helps. He ate a banana today and kept it down but has nausea off and on. Wants to know if he can just take prochlorperazole instead of zofran. He is seeing oncologist today at 3:30

## 2022-03-01 NOTE — Telephone Encounter (Signed)
I do not know why he was told not to take Zofran (no note of this in the medical chart).  Should be OK to take prochlorperazine but would advise to discuss with oncologist as well.

## 2022-03-01 NOTE — Progress Notes (Signed)
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 has pain around ostomy site and port site --continue Tylenol 500 mg every 6 hours as needed --Patient declines oxycodone due to constipation.   No orders of the defined types were placed in this encounter.  All questions were answered. The patient knows to call the clinic with any problems, questions or concerns.  A total of more than 30 minutes were spent on this encounter with face-to-face time and non-face-to-face time, including preparing to see the patient, ordering tests and/or medications, counseling the patient and coordination of care as outlined above.   Ledell Peoples, MD Department of Hematology/Oncology [place] [facility] Phone: [phone] Pager: [phone] Email: [email or web address]  03/01/2022 3:17 PM

## 2022-03-01 NOTE — Progress Notes (Signed)
DISCONTINUE ON PATHWAY REGIMEN - Small Cell Lung     A cycle is every 21 days:     Etoposide      Cisplatin   **Always confirm dose/schedule in your pharmacy ordering system**  REASON: Disease Progression PRIOR TREATMENT: LOS15: Cisplatin 75 mg/m2 D1 + Etoposide 100 mg/m2 IV D1-3 q21 Days x 4 Cycles with Concurrent Radiation TREATMENT RESPONSE: Complete Response (CR)  START ON PATHWAY REGIMEN - Small Cell Lung     A cycle is every 21 days:     Lurbinectedin   **Always confirm dose/schedule in your pharmacy ordering system**  Patient Characteristics: Relapsed or Progressive Disease, Second Line, Relapse ? 6 Months Therapeutic Status: Relapsed or Progressive Disease Line of Therapy: Second Line Time to Relapse: Relapse ? 6 Months Intent of Therapy: Non-Curative / Palliative Intent, Discussed with Patient

## 2022-03-02 ENCOUNTER — Encounter: Payer: Self-pay | Admitting: Hematology and Oncology

## 2022-03-02 ENCOUNTER — Inpatient Hospital Stay: Payer: 59 | Admitting: Licensed Clinical Social Worker

## 2022-03-02 DIAGNOSIS — C3490 Malignant neoplasm of unspecified part of unspecified bronchus or lung: Secondary | ICD-10-CM

## 2022-03-02 NOTE — Progress Notes (Signed)
[place] CSW Progress Note  Clinical Education officer, museum  received referral from medical team due to recurrence on recent scan and pt reportedly lost his father 1 week ago to cancer.  Pt is known to CSW and has been historically difficult to reach by phone.  Multiple attempts made to contact pt by phone with no answer and voice mail has not been set up.  CSW to see pt in person once treatment has been scheduled.        Henriette Combs, LCSW    Patient is participating in a Managed Medicaid Plan:  Yes

## 2022-03-02 NOTE — Progress Notes (Signed)
[place] CSW Progress Note  Clinical Education officer, museum  received referral from medical team regarding pt.  Pt's recent scans show recurrence and pt recently lost his father to liver cancer.  Pt is known to [place] and has historically been difficult to reach by phone.  Multiple attempts to reach pt; however, there has been no answer and pt's voicemail is not set up.  CSW to see pt in person once treatment is scheduled.        Henriette Combs, LCSW    Patient is participating in a Managed Medicaid Plan:  Yes

## 2022-03-02 NOTE — Telephone Encounter (Signed)
Discussed with patient and he verbalized understanding.

## 2022-03-03 ENCOUNTER — Inpatient Hospital Stay: Payer: 59 | Admitting: Licensed Clinical Social Worker

## 2022-03-03 NOTE — Progress Notes (Signed)
Dakota Bryant Progress Note  Holiday representative  spoke with pt by phone.  Emotional support provided as pt had completed treatment in September and has now had a recurrence.    Pt stating he wants to move forward with treatment and requesting Bryant inform Dr. Lorenso Courier and team that he would like to start on Friday versus Wednesday next week.  Medical team advised.  Bryant to meet w/ pt in infusion once appointment is booked to offer additional emotional support.      Henriette Combs, LCSW    Patient is participating in a Managed Medicaid Plan:  Yes

## 2022-03-06 ENCOUNTER — Telehealth: Payer: Self-pay | Admitting: Hematology and Oncology

## 2022-03-06 NOTE — Telephone Encounter (Signed)
Patient called to see when next infusion will be. Message sent to RN for clarification on patient's treatment plan.

## 2022-03-07 ENCOUNTER — Other Ambulatory Visit: Payer: Self-pay | Admitting: Hematology and Oncology

## 2022-03-07 ENCOUNTER — Telehealth: Payer: Self-pay | Admitting: Hematology and Oncology

## 2022-03-07 ENCOUNTER — Other Ambulatory Visit: Payer: Self-pay

## 2022-03-07 DIAGNOSIS — C3432 Malignant neoplasm of lower lobe, left bronchus or lung: Secondary | ICD-10-CM

## 2022-03-07 NOTE — Telephone Encounter (Signed)
Called patient to notify of upcoming appointments. Patient notified and mailing calendars.

## 2022-03-08 ENCOUNTER — Other Ambulatory Visit: Payer: Self-pay

## 2022-03-13 ENCOUNTER — Encounter: Payer: Self-pay | Admitting: Hematology and Oncology

## 2022-03-16 MED FILL — Dexamethasone Sodium Phosphate Inj 100 MG/10ML: INTRAMUSCULAR | Qty: 1 | Status: AC

## 2022-03-17 ENCOUNTER — Encounter: Payer: 59 | Admitting: Dietician

## 2022-03-17 ENCOUNTER — Ambulatory Visit: Payer: 59 | Admitting: Physician Assistant

## 2022-03-17 ENCOUNTER — Ambulatory Visit: Payer: 59

## 2022-03-17 ENCOUNTER — Other Ambulatory Visit: Payer: 59

## 2022-03-17 DIAGNOSIS — S098XXA Other specified injuries of head, initial encounter: Secondary | ICD-10-CM | POA: Diagnosis not present

## 2022-03-17 DIAGNOSIS — Z23 Encounter for immunization: Secondary | ICD-10-CM | POA: Diagnosis not present

## 2022-03-17 DIAGNOSIS — Y999 Unspecified external cause status: Secondary | ICD-10-CM | POA: Diagnosis not present

## 2022-03-17 DIAGNOSIS — S0081XA Abrasion of other part of head, initial encounter: Secondary | ICD-10-CM | POA: Diagnosis not present

## 2022-03-17 DIAGNOSIS — M79651 Pain in right thigh: Secondary | ICD-10-CM | POA: Diagnosis not present

## 2022-03-17 DIAGNOSIS — S0181XA Laceration without foreign body of other part of head, initial encounter: Secondary | ICD-10-CM | POA: Diagnosis not present

## 2022-03-17 DIAGNOSIS — R220 Localized swelling, mass and lump, head: Secondary | ICD-10-CM | POA: Diagnosis not present

## 2022-03-17 DIAGNOSIS — M25561 Pain in right knee: Secondary | ICD-10-CM | POA: Diagnosis not present

## 2022-03-22 ENCOUNTER — Telehealth: Payer: Self-pay | Admitting: Hematology and Oncology

## 2022-03-22 ENCOUNTER — Telehealth: Payer: Self-pay | Admitting: *Deleted

## 2022-03-22 NOTE — Telephone Encounter (Signed)
See previous note
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
## 2022-03-22 NOTE — Telephone Encounter (Signed)
Dakota Bryant needs refills on his alprazolam 0.5 mg(last filled on 03/01/22 for # 30) And refills on his Tramadol  50 mg (last filled in 03/01/22 for #60) Pt uses [facility] in [place]
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
## 2022-03-23 ENCOUNTER — Other Ambulatory Visit: Payer: Self-pay

## 2022-03-24 ENCOUNTER — Other Ambulatory Visit: Payer: 59

## 2022-03-24 ENCOUNTER — Ambulatory Visit: Payer: 59 | Admitting: Physician Assistant

## 2022-03-24 ENCOUNTER — Ambulatory Visit: Payer: 59

## 2022-03-28 ENCOUNTER — Encounter: Payer: 59 | Admitting: Nutrition

## 2022-03-31 ENCOUNTER — Ambulatory Visit: Payer: 59 | Admitting: Physician Assistant

## 2022-04-03 ENCOUNTER — Emergency Department (HOSPITAL_COMMUNITY)
Admission: EM | Admit: 2022-04-03 | Discharge: 2022-04-05 | Discharge status: Against Medical Advice | Payer: 59 | Attending: Emergency Medicine | Admitting: Emergency Medicine

## 2022-04-03 ENCOUNTER — Encounter (HOSPITAL_COMMUNITY): Payer: Self-pay | Admitting: Emergency Medicine

## 2022-04-03 ENCOUNTER — Other Ambulatory Visit: Payer: Self-pay

## 2022-04-03 DIAGNOSIS — R5381 Other malaise: Secondary | ICD-10-CM | POA: Diagnosis not present

## 2022-04-03 DIAGNOSIS — R4182 Altered mental status, unspecified: Secondary | ICD-10-CM | POA: Insufficient documentation

## 2022-04-03 DIAGNOSIS — R410 Disorientation, unspecified: Secondary | ICD-10-CM | POA: Diagnosis not present

## 2022-04-03 DIAGNOSIS — R279 Unspecified lack of coordination: Secondary | ICD-10-CM | POA: Diagnosis not present

## 2022-04-03 DIAGNOSIS — Z5321 Procedure and treatment not carried out due to patient leaving prior to being seen by health care provider: Secondary | ICD-10-CM | POA: Diagnosis not present
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
## 2022-04-04 ENCOUNTER — Emergency Department (HOSPITAL_COMMUNITY): Payer: 59

## 2022-04-04 NOTE — ED Notes (Signed)
Refused vitals 

## 2022-04-04 NOTE — ED Notes (Signed)
Pt refuse lab work

## 2022-04-04 NOTE — ED Notes (Signed)
Pt refused labs and scans. Pt currently alert and oriented x4 with this RN and PA. Discussed with PA and verified okay for pt to refuse and be sent to lobby.

## 2022-04-04 NOTE — ED Notes (Addendum)
Pt seen walking out the front door after trying to convince pt to stay and be seen. Pt wearing camo one-piece

## 2022-04-04 NOTE — ED Notes (Signed)
Pt refused CT scan

## 2022-04-06 MED FILL — Dexamethasone Sodium Phosphate Inj 100 MG/10ML: INTRAMUSCULAR | Qty: 1 | Status: AC

## 2022-04-07 ENCOUNTER — Inpatient Hospital Stay: Payer: 59

## 2022-04-07 ENCOUNTER — Inpatient Hospital Stay: Payer: 59 | Admitting: Hematology and Oncology

## 2022-04-07 ENCOUNTER — Inpatient Hospital Stay: Payer: 59 | Attending: Hematology and Oncology

## 2022-04-07 ENCOUNTER — Telehealth: Payer: Self-pay | Admitting: *Deleted

## 2022-04-07 ENCOUNTER — Inpatient Hospital Stay: Payer: 59 | Admitting: Dietician

## 2022-04-07 ENCOUNTER — Encounter: Payer: Self-pay | Admitting: General Practice

## 2022-04-07 ENCOUNTER — Other Ambulatory Visit: Payer: 59

## 2022-04-07 NOTE — Patient Outreach (Signed)
  Care Coordination [facility] Note Transition Care Management Unsuccessful Follow-up Telephone Call  Date of discharge and from where:  04/03/22, [facility] from [facility]  Attempts:  1st Attempt  Reason for unsuccessful TCM follow-up call:  Unable to leave message   Estanislado Emms RN, BSN [place]  [facility] RN Care Coordinator

## 2022-04-07 NOTE — Progress Notes (Signed)
[facility] Spiritual Care Note  Planned to follow up with Dakota Bryant in infusion today, but he did not attend his preceding appointments. Attempted follow-up by phone, but phone number not in service. Will plan to see at next treatment on 2/9.   [address] [place], [place], [facility] Pager [phone] Voicemail [phone]

## 2022-04-07 NOTE — Progress Notes (Signed)
Scheduled to see patient during infusion for nutrition follow-up. Patient did not show for appointments today. Will reschedule patient as able.

## 2022-04-10 ENCOUNTER — Emergency Department (HOSPITAL_COMMUNITY): Payer: 59

## 2022-04-10 ENCOUNTER — Telehealth: Payer: Self-pay | Admitting: Hematology and Oncology

## 2022-04-10 ENCOUNTER — Inpatient Hospital Stay (HOSPITAL_COMMUNITY)
Admission: EM | Admit: 2022-04-10 | Discharge: 2022-04-20 | DRG: 388 | Disposition: E | Payer: 59 | Attending: Internal Medicine | Admitting: Internal Medicine

## 2022-04-10 ENCOUNTER — Other Ambulatory Visit: Payer: Self-pay

## 2022-04-10 ENCOUNTER — Encounter (HOSPITAL_COMMUNITY): Payer: Self-pay

## 2022-04-10 DIAGNOSIS — R079 Chest pain, unspecified: Secondary | ICD-10-CM | POA: Diagnosis present

## 2022-04-10 DIAGNOSIS — I10 Essential (primary) hypertension: Secondary | ICD-10-CM | POA: Insufficient documentation

## 2022-04-10 DIAGNOSIS — Z923 Personal history of irradiation: Secondary | ICD-10-CM

## 2022-04-10 DIAGNOSIS — Z681 Body mass index (BMI) 19 or less, adult: Secondary | ICD-10-CM

## 2022-04-10 DIAGNOSIS — R54 Age-related physical debility: Secondary | ICD-10-CM | POA: Diagnosis present

## 2022-04-10 DIAGNOSIS — N200 Calculus of kidney: Secondary | ICD-10-CM | POA: Diagnosis not present

## 2022-04-10 DIAGNOSIS — L89151 Pressure ulcer of sacral region, stage 1: Secondary | ICD-10-CM | POA: Diagnosis present

## 2022-04-10 DIAGNOSIS — Z9221 Personal history of antineoplastic chemotherapy: Secondary | ICD-10-CM

## 2022-04-10 DIAGNOSIS — R64 Cachexia: Secondary | ICD-10-CM | POA: Diagnosis present

## 2022-04-10 DIAGNOSIS — Z8616 Personal history of COVID-19: Secondary | ICD-10-CM

## 2022-04-10 DIAGNOSIS — Z9049 Acquired absence of other specified parts of digestive tract: Secondary | ICD-10-CM

## 2022-04-10 DIAGNOSIS — R4781 Slurred speech: Secondary | ICD-10-CM | POA: Diagnosis present

## 2022-04-10 DIAGNOSIS — F17210 Nicotine dependence, cigarettes, uncomplicated: Secondary | ICD-10-CM | POA: Diagnosis present

## 2022-04-10 DIAGNOSIS — R000 Tachycardia, unspecified: Secondary | ICD-10-CM

## 2022-04-10 DIAGNOSIS — M25511 Pain in right shoulder: Secondary | ICD-10-CM | POA: Diagnosis not present

## 2022-04-10 DIAGNOSIS — M6250 Muscle wasting and atrophy, not elsewhere classified, unspecified site: Secondary | ICD-10-CM | POA: Diagnosis present

## 2022-04-10 DIAGNOSIS — R519 Headache, unspecified: Secondary | ICD-10-CM | POA: Diagnosis not present

## 2022-04-10 DIAGNOSIS — W19XXXA Unspecified fall, initial encounter: Secondary | ICD-10-CM | POA: Diagnosis present

## 2022-04-10 DIAGNOSIS — Z515 Encounter for palliative care: Secondary | ICD-10-CM

## 2022-04-10 DIAGNOSIS — E871 Hypo-osmolality and hyponatremia: Secondary | ICD-10-CM | POA: Diagnosis not present

## 2022-04-10 DIAGNOSIS — K56609 Unspecified intestinal obstruction, unspecified as to partial versus complete obstruction: Principal | ICD-10-CM | POA: Diagnosis present

## 2022-04-10 DIAGNOSIS — R296 Repeated falls: Secondary | ICD-10-CM

## 2022-04-10 DIAGNOSIS — N179 Acute kidney failure, unspecified: Secondary | ICD-10-CM

## 2022-04-10 DIAGNOSIS — I119 Hypertensive heart disease without heart failure: Secondary | ICD-10-CM | POA: Diagnosis present

## 2022-04-10 DIAGNOSIS — J432 Centrilobular emphysema: Secondary | ICD-10-CM | POA: Diagnosis present

## 2022-04-10 DIAGNOSIS — R188 Other ascites: Secondary | ICD-10-CM | POA: Diagnosis present

## 2022-04-10 DIAGNOSIS — E860 Dehydration: Secondary | ICD-10-CM | POA: Diagnosis present

## 2022-04-10 DIAGNOSIS — D630 Anemia in neoplastic disease: Secondary | ICD-10-CM | POA: Diagnosis present

## 2022-04-10 DIAGNOSIS — C3432 Malignant neoplasm of lower lobe, left bronchus or lung: Secondary | ICD-10-CM | POA: Diagnosis present

## 2022-04-10 DIAGNOSIS — D696 Thrombocytopenia, unspecified: Secondary | ICD-10-CM | POA: Diagnosis present

## 2022-04-10 DIAGNOSIS — R4589 Other symptoms and signs involving emotional state: Secondary | ICD-10-CM | POA: Diagnosis present

## 2022-04-10 DIAGNOSIS — Z8249 Family history of ischemic heart disease and other diseases of the circulatory system: Secondary | ICD-10-CM

## 2022-04-10 DIAGNOSIS — Z79899 Other long term (current) drug therapy: Secondary | ICD-10-CM

## 2022-04-10 DIAGNOSIS — S2231XA Fracture of one rib, right side, initial encounter for closed fracture: Secondary | ICD-10-CM | POA: Diagnosis not present

## 2022-04-10 DIAGNOSIS — E869 Volume depletion, unspecified: Secondary | ICD-10-CM | POA: Diagnosis present

## 2022-04-10 DIAGNOSIS — Z66 Do not resuscitate: Secondary | ICD-10-CM | POA: Diagnosis present

## 2022-04-10 DIAGNOSIS — K7689 Other specified diseases of liver: Secondary | ICD-10-CM | POA: Diagnosis not present

## 2022-04-10 DIAGNOSIS — K219 Gastro-esophageal reflux disease without esophagitis: Secondary | ICD-10-CM | POA: Diagnosis present

## 2022-04-10 DIAGNOSIS — E878 Other disorders of electrolyte and fluid balance, not elsewhere classified: Secondary | ICD-10-CM | POA: Diagnosis present

## 2022-04-10 DIAGNOSIS — R7989 Other specified abnormal findings of blood chemistry: Secondary | ICD-10-CM

## 2022-04-10 DIAGNOSIS — C787 Secondary malignant neoplasm of liver and intrahepatic bile duct: Secondary | ICD-10-CM | POA: Diagnosis present

## 2022-04-10 DIAGNOSIS — S0101XA Laceration without foreign body of scalp, initial encounter: Secondary | ICD-10-CM | POA: Diagnosis present

## 2022-04-10 DIAGNOSIS — E43 Unspecified severe protein-calorie malnutrition: Secondary | ICD-10-CM | POA: Diagnosis present

## 2022-04-10 DIAGNOSIS — R7401 Elevation of levels of liver transaminase levels: Secondary | ICD-10-CM | POA: Diagnosis present

## 2022-04-10 DIAGNOSIS — R0682 Tachypnea, not elsewhere classified: Secondary | ICD-10-CM | POA: Diagnosis present

## 2022-04-10 DIAGNOSIS — Z933 Colostomy status: Secondary | ICD-10-CM

## 2022-04-10 DIAGNOSIS — R6883 Chills (without fever): Secondary | ICD-10-CM | POA: Diagnosis present

## 2022-04-10 DIAGNOSIS — K7682 Hepatic encephalopathy: Secondary | ICD-10-CM | POA: Diagnosis not present

## 2022-04-10 DIAGNOSIS — C3490 Malignant neoplasm of unspecified part of unspecified bronchus or lung: Secondary | ICD-10-CM | POA: Insufficient documentation

## 2022-04-10 DIAGNOSIS — R3915 Urgency of urination: Secondary | ICD-10-CM | POA: Diagnosis present

## 2022-04-10 DIAGNOSIS — Z833 Family history of diabetes mellitus: Secondary | ICD-10-CM

## 2022-04-10 DIAGNOSIS — J9811 Atelectasis: Secondary | ICD-10-CM | POA: Diagnosis present

## 2022-04-10 LAB — CBC WITH DIFFERENTIAL/PLATELET
Abs Immature Granulocytes: 0.03 10*3/uL (ref 0.00–0.07)
Basophils Absolute: 0 10*3/uL (ref 0.0–0.1)
Basophils Relative: 0 %
Eosinophils Absolute: 0 10*3/uL (ref 0.0–0.5)
Eosinophils Relative: 0 %
HCT: 32 % — ABNORMAL LOW (ref 39.0–52.0)
Hemoglobin: 12 g/dL — ABNORMAL LOW (ref 13.0–17.0)
Immature Granulocytes: 1 %
Lymphocytes Relative: 6 %
Lymphs Abs: 0.4 10*3/uL — ABNORMAL LOW (ref 0.7–4.0)
MCH: 33.7 pg (ref 26.0–34.0)
MCHC: 37.5 g/dL — ABNORMAL HIGH (ref 30.0–36.0)
MCV: 89.9 fL (ref 80.0–100.0)
Monocytes Absolute: 0.4 10*3/uL (ref 0.1–1.0)
Monocytes Relative: 6 %
Neutro Abs: 5.7 10*3/uL (ref 1.7–7.7)
Neutrophils Relative %: 87 %
Platelets: 105 10*3/uL — ABNORMAL LOW (ref 150–400)
RBC: 3.56 MIL/uL — ABNORMAL LOW (ref 4.22–5.81)
RDW: 18 % — ABNORMAL HIGH (ref 11.5–15.5)
WBC: 6.4 10*3/uL (ref 4.0–10.5)
nRBC: 0.3 % — ABNORMAL HIGH (ref 0.0–0.2)

## 2022-04-10 LAB — COMPREHENSIVE METABOLIC PANEL
ALT: 135 U/L — ABNORMAL HIGH (ref 0–44)
AST: 190 U/L — ABNORMAL HIGH (ref 15–41)
Albumin: 2.8 g/dL — ABNORMAL LOW (ref 3.5–5.0)
Alkaline Phosphatase: 346 U/L — ABNORMAL HIGH (ref 38–126)
Anion gap: 13 (ref 5–15)
BUN: 71 mg/dL — ABNORMAL HIGH (ref 6–20)
CO2: 28 mmol/L (ref 22–32)
Calcium: 8.5 mg/dL — ABNORMAL LOW (ref 8.9–10.3)
Chloride: 84 mmol/L — ABNORMAL LOW (ref 98–111)
Creatinine, Ser: 1.13 mg/dL (ref 0.61–1.24)
GFR, Estimated: >60 mL/min (ref >60)
Glucose, Bld: 82 mg/dL (ref 70–99)
Potassium: 4.1 mmol/L (ref 3.5–5.1)
Sodium: 125 mmol/L — ABNORMAL LOW (ref 135–145)
Total Bilirubin: 8.2 mg/dL — ABNORMAL HIGH (ref 0.3–1.2)
Total Protein: 5.6 g/dL — ABNORMAL LOW (ref 6.5–8.1)

## 2022-04-10 LAB — CBG MONITORING, ED: Glucose-Capillary: 86 mg/dL (ref 70–99)

## 2022-04-10 LAB — AMMONIA: Ammonia: 86 umol/L — ABNORMAL HIGH (ref 9–35)

## 2022-04-10 MED ORDER — SODIUM CHLORIDE 0.9 % IV BOLUS
500.0000 mL | Freq: Once | INTRAVENOUS | Status: AC
  Administered 2022-04-10: 500 mL via INTRAVENOUS

## 2022-04-10 MED ORDER — LACTULOSE 10 GM/15ML PO SOLN
30.0000 g | Freq: Once | ORAL | Status: AC
  Administered 2022-04-10: 30 g via ORAL
  Filled 2022-04-10: qty 60

## 2022-04-10 MED ORDER — IOHEXOL 350 MG/ML SOLN
75.0000 mL | Freq: Once | INTRAVENOUS | Status: AC | PRN
  Administered 2022-04-10: 75 mL via INTRAVENOUS

## 2022-04-10 MED ORDER — MORPHINE SULFATE (PF) 2 MG/ML IV SOLN
2.0000 mg | Freq: Once | INTRAVENOUS | Status: AC
  Administered 2022-04-10: 2 mg via INTRAVENOUS
  Filled 2022-04-10: qty 1

## 2022-04-10 MED ORDER — NICOTINE 14 MG/24HR TD PT24
14.0000 mg | MEDICATED_PATCH | Freq: Every day | TRANSDERMAL | Status: DC
  Administered 2022-04-10 – 2022-04-12 (×3): 14 mg via TRANSDERMAL
  Filled 2022-04-10 (×3): qty 1

## 2022-04-10 MED ORDER — ACETAMINOPHEN 325 MG PO TABS
650.0000 mg | ORAL_TABLET | Freq: Once | ORAL | Status: AC
  Administered 2022-04-10: 650 mg via ORAL
  Filled 2022-04-10: qty 2

## 2022-04-10 NOTE — ED Notes (Signed)
Patient transported to CT 

## 2022-04-10 NOTE — Telephone Encounter (Signed)
Called to try and r/s patient and spoke with sister. She informed that patient is not doing well at all and suggested going to ED. Will forward information to MD and RN to see what best course is.
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
## 2022-04-10 NOTE — ED Notes (Signed)
Pt wife requesting to sign pt out AMA, provider made aware.

## 2022-04-11 DIAGNOSIS — Z7189 Other specified counseling: Secondary | ICD-10-CM | POA: Diagnosis not present

## 2022-04-11 DIAGNOSIS — F17210 Nicotine dependence, cigarettes, uncomplicated: Secondary | ICD-10-CM | POA: Diagnosis not present

## 2022-04-11 DIAGNOSIS — J9811 Atelectasis: Secondary | ICD-10-CM | POA: Diagnosis not present

## 2022-04-11 DIAGNOSIS — R296 Repeated falls: Secondary | ICD-10-CM

## 2022-04-11 DIAGNOSIS — K5669 Other partial intestinal obstruction: Secondary | ICD-10-CM | POA: Diagnosis not present

## 2022-04-11 DIAGNOSIS — C787 Secondary malignant neoplasm of liver and intrahepatic bile duct: Secondary | ICD-10-CM | POA: Diagnosis not present

## 2022-04-11 DIAGNOSIS — K56609 Unspecified intestinal obstruction, unspecified as to partial versus complete obstruction: Secondary | ICD-10-CM | POA: Diagnosis not present

## 2022-04-11 DIAGNOSIS — K6389 Other specified diseases of intestine: Secondary | ICD-10-CM | POA: Diagnosis not present

## 2022-04-11 DIAGNOSIS — R000 Tachycardia, unspecified: Secondary | ICD-10-CM

## 2022-04-11 DIAGNOSIS — I119 Hypertensive heart disease without heart failure: Secondary | ICD-10-CM | POA: Diagnosis not present

## 2022-04-11 DIAGNOSIS — Z515 Encounter for palliative care: Secondary | ICD-10-CM

## 2022-04-11 DIAGNOSIS — Z933 Colostomy status: Secondary | ICD-10-CM | POA: Diagnosis not present

## 2022-04-11 DIAGNOSIS — N200 Calculus of kidney: Secondary | ICD-10-CM | POA: Diagnosis not present

## 2022-04-11 DIAGNOSIS — Z66 Do not resuscitate: Secondary | ICD-10-CM | POA: Diagnosis not present

## 2022-04-11 DIAGNOSIS — Z789 Other specified health status: Secondary | ICD-10-CM | POA: Diagnosis not present

## 2022-04-11 DIAGNOSIS — K7682 Hepatic encephalopathy: Secondary | ICD-10-CM | POA: Diagnosis not present

## 2022-04-11 DIAGNOSIS — E860 Dehydration: Secondary | ICD-10-CM | POA: Diagnosis not present

## 2022-04-11 DIAGNOSIS — I10 Essential (primary) hypertension: Secondary | ICD-10-CM

## 2022-04-11 DIAGNOSIS — R64 Cachexia: Secondary | ICD-10-CM | POA: Diagnosis not present

## 2022-04-11 DIAGNOSIS — D630 Anemia in neoplastic disease: Secondary | ICD-10-CM | POA: Diagnosis not present

## 2022-04-11 DIAGNOSIS — C3432 Malignant neoplasm of lower lobe, left bronchus or lung: Secondary | ICD-10-CM | POA: Diagnosis not present

## 2022-04-11 DIAGNOSIS — C3490 Malignant neoplasm of unspecified part of unspecified bronchus or lung: Secondary | ICD-10-CM

## 2022-04-11 DIAGNOSIS — R638 Other symptoms and signs concerning food and fluid intake: Secondary | ICD-10-CM | POA: Diagnosis not present

## 2022-04-11 DIAGNOSIS — R7989 Other specified abnormal findings of blood chemistry: Secondary | ICD-10-CM

## 2022-04-11 DIAGNOSIS — R188 Other ascites: Secondary | ICD-10-CM | POA: Diagnosis not present

## 2022-04-11 DIAGNOSIS — W19XXXA Unspecified fall, initial encounter: Secondary | ICD-10-CM | POA: Diagnosis not present

## 2022-04-11 DIAGNOSIS — Z681 Body mass index (BMI) 19 or less, adult: Secondary | ICD-10-CM | POA: Diagnosis not present

## 2022-04-11 DIAGNOSIS — N179 Acute kidney failure, unspecified: Secondary | ICD-10-CM | POA: Diagnosis not present

## 2022-04-11 DIAGNOSIS — Z85118 Personal history of other malignant neoplasm of bronchus and lung: Secondary | ICD-10-CM | POA: Diagnosis not present

## 2022-04-11 DIAGNOSIS — Z711 Person with feared health complaint in whom no diagnosis is made: Secondary | ICD-10-CM | POA: Diagnosis not present

## 2022-04-11 DIAGNOSIS — Z8616 Personal history of COVID-19: Secondary | ICD-10-CM | POA: Diagnosis not present

## 2022-04-11 DIAGNOSIS — D696 Thrombocytopenia, unspecified: Secondary | ICD-10-CM | POA: Diagnosis not present

## 2022-04-11 DIAGNOSIS — J432 Centrilobular emphysema: Secondary | ICD-10-CM | POA: Diagnosis not present

## 2022-04-11 DIAGNOSIS — E871 Hypo-osmolality and hyponatremia: Secondary | ICD-10-CM

## 2022-04-11 DIAGNOSIS — L89151 Pressure ulcer of sacral region, stage 1: Secondary | ICD-10-CM | POA: Diagnosis not present

## 2022-04-11 DIAGNOSIS — E43 Unspecified severe protein-calorie malnutrition: Secondary | ICD-10-CM | POA: Diagnosis not present

## 2022-04-11 LAB — URINALYSIS, ROUTINE W REFLEX MICROSCOPIC
Bacteria, UA: NONE SEEN
Bilirubin Urine: NEGATIVE
Glucose, UA: NEGATIVE mg/dL
Ketones, ur: NEGATIVE mg/dL
Leukocytes,Ua: NEGATIVE
Nitrite: NEGATIVE
Protein, ur: 30 mg/dL — AB
RBC / HPF: >50 RBC/hpf — ABNORMAL HIGH (ref 0–5)
Specific Gravity, Urine: 1.024 (ref 1.005–1.030)
pH: 5 (ref 5.0–8.0)

## 2022-04-11 LAB — BASIC METABOLIC PANEL
Anion gap: 13 (ref 5–15)
BUN: 54 mg/dL — ABNORMAL HIGH (ref 6–20)
CO2: 25 mmol/L (ref 22–32)
Calcium: 7.9 mg/dL — ABNORMAL LOW (ref 8.9–10.3)
Chloride: 90 mmol/L — ABNORMAL LOW (ref 98–111)
Creatinine, Ser: 0.89 mg/dL (ref 0.61–1.24)
GFR, Estimated: >60 mL/min (ref >60)
Glucose, Bld: 77 mg/dL (ref 70–99)
Potassium: 3.6 mmol/L (ref 3.5–5.1)
Sodium: 128 mmol/L — ABNORMAL LOW (ref 135–145)

## 2022-04-11 LAB — CBC
HCT: 28.9 % — ABNORMAL LOW (ref 39.0–52.0)
Hemoglobin: 10.7 g/dL — ABNORMAL LOW (ref 13.0–17.0)
MCH: 33.6 pg (ref 26.0–34.0)
MCHC: 37 g/dL — ABNORMAL HIGH (ref 30.0–36.0)
MCV: 90.9 fL (ref 80.0–100.0)
Platelets: 91 10*3/uL — ABNORMAL LOW (ref 150–400)
RBC: 3.18 MIL/uL — ABNORMAL LOW (ref 4.22–5.81)
RDW: 18.5 % — ABNORMAL HIGH (ref 11.5–15.5)
WBC: 6.3 10*3/uL (ref 4.0–10.5)
nRBC: 0.3 % — ABNORMAL HIGH (ref 0.0–0.2)

## 2022-04-11 LAB — AMMONIA: Ammonia: 126 umol/L — ABNORMAL HIGH (ref 9–35)

## 2022-04-11 MED ORDER — TAMSULOSIN HCL 0.4 MG PO CAPS
0.4000 mg | ORAL_CAPSULE | Freq: Every day | ORAL | Status: DC
  Filled 2022-04-11: qty 1

## 2022-04-11 MED ORDER — ALPRAZOLAM 0.25 MG PO TABS
1.0000 mg | ORAL_TABLET | Freq: Three times a day (TID) | ORAL | Status: DC | PRN
  Administered 2022-04-11: 1 mg via ORAL
  Filled 2022-04-11: qty 4

## 2022-04-11 MED ORDER — MAGNESIUM HYDROXIDE 400 MG/5ML PO SUSP: 30.0000 mL | Freq: Every day | ORAL | Status: DC | PRN

## 2022-04-11 MED ORDER — NAPHAZOLINE-GLYCERIN 0.012-0.25 % OP SOLN
1.0000 [drp] | Freq: Two times a day (BID) | OPHTHALMIC | Status: DC
  Administered 2022-04-11 – 2022-04-12 (×2): 1 [drp] via OPHTHALMIC
  Filled 2022-04-11: qty 15

## 2022-04-11 MED ORDER — PANTOPRAZOLE SODIUM 40 MG PO TBEC
40.0000 mg | DELAYED_RELEASE_TABLET | Freq: Every day | ORAL | Status: DC
  Filled 2022-04-11: qty 1

## 2022-04-11 MED ORDER — MORPHINE SULFATE (PF) 2 MG/ML IV SOLN
2.0000 mg | INTRAVENOUS | Status: DC | PRN
  Administered 2022-04-11 – 2022-04-12 (×4): 2 mg via INTRAVENOUS
  Filled 2022-04-11 (×4): qty 1

## 2022-04-11 MED ORDER — SENNOSIDES-DOCUSATE SODIUM 8.6-50 MG PO TABS: 1.0000 | ORAL_TABLET | Freq: Every day | ORAL | Status: DC

## 2022-04-11 MED ORDER — LORATADINE 10 MG PO TABS: 10.0000 mg | ORAL_TABLET | Freq: Every day | ORAL | Status: DC

## 2022-04-11 MED ORDER — LABETALOL HCL 100 MG PO TABS
200.0000 mg | ORAL_TABLET | Freq: Two times a day (BID) | ORAL | Status: DC
  Administered 2022-04-11: 200 mg via ORAL
  Filled 2022-04-11: qty 1
  Filled 2022-04-11 (×2): qty 2

## 2022-04-11 MED ORDER — AMLODIPINE BESYLATE 10 MG PO TABS
10.0000 mg | ORAL_TABLET | Freq: Every day | ORAL | Status: DC
  Filled 2022-04-11: qty 1

## 2022-04-11 MED ORDER — OXYMETAZOLINE HCL 0.05 % NA SOLN
1.0000 | Freq: Two times a day (BID) | NASAL | Status: DC
  Administered 2022-04-11: 1 via NASAL
  Filled 2022-04-11: qty 30

## 2022-04-11 MED ORDER — SODIUM CHLORIDE 0.9 % IV SOLN: INTRAVENOUS | Status: DC

## 2022-04-11 MED ORDER — LACTULOSE ENEMA
300.0000 mL | Freq: Three times a day (TID) | ORAL | Status: DC
  Filled 2022-04-11 (×3): qty 300

## 2022-04-11 MED ORDER — TRAZODONE HCL 50 MG PO TABS
25.0000 mg | ORAL_TABLET | Freq: Every day | ORAL | Status: DC
  Administered 2022-04-11: 50 mg via ORAL
  Filled 2022-04-11: qty 1

## 2022-04-11 MED ORDER — TRAZODONE HCL 50 MG PO TABS: 25.0000 mg | ORAL_TABLET | Freq: Every evening | ORAL | Status: DC | PRN

## 2022-04-11 MED ORDER — ACETAMINOPHEN 650 MG RE SUPP: 650.0000 mg | Freq: Four times a day (QID) | RECTAL | Status: DC | PRN

## 2022-04-11 MED ORDER — ENOXAPARIN SODIUM 40 MG/0.4ML IJ SOSY
40.0000 mg | PREFILLED_SYRINGE | INTRAMUSCULAR | Status: DC
  Administered 2022-04-11: 40 mg via SUBCUTANEOUS
  Filled 2022-04-11 (×2): qty 0.4

## 2022-04-11 MED ORDER — ONDANSETRON HCL 4 MG/2ML IJ SOLN: 4.0000 mg | Freq: Four times a day (QID) | INTRAMUSCULAR | Status: DC | PRN

## 2022-04-11 MED ORDER — TRAMADOL HCL 50 MG PO TABS
50.0000 mg | ORAL_TABLET | Freq: Four times a day (QID) | ORAL | Status: DC | PRN
  Administered 2022-04-11: 100 mg via ORAL
  Filled 2022-04-11: qty 1
  Filled 2022-04-11: qty 2

## 2022-04-11 MED ORDER — ACETAMINOPHEN 325 MG PO TABS: 650.0000 mg | ORAL_TABLET | Freq: Four times a day (QID) | ORAL | Status: DC | PRN

## 2022-04-11 MED ORDER — ONDANSETRON HCL 4 MG PO TABS: 4.0000 mg | ORAL_TABLET | Freq: Four times a day (QID) | ORAL | Status: DC | PRN

## 2022-04-11 MED ORDER — SODIUM CHLORIDE 1 G PO TABS
1.0000 g | ORAL_TABLET | Freq: Every day | ORAL | Status: DC
  Filled 2022-04-11: qty 1

## 2022-04-11 NOTE — Assessment & Plan Note (Signed)
-  We will continue PPI therapy.
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
## 2022-04-11 NOTE — Assessment & Plan Note (Signed)
-  The patient will be admitted to a medical telemetry bed. - He will be kept n.p.o. - 2 view abdomen x-ray will be obtained in AM. - General surgery consult will be obtained.

## 2022-04-11 NOTE — ED Notes (Signed)
Provider made aware of pt's HR.
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
## 2022-04-11 NOTE — Assessment & Plan Note (Signed)
-  This is likely secondary to hepatic metastasis. - We will follow LFTs.

## 2022-04-11 NOTE — Assessment & Plan Note (Signed)
-  We will continue p.o. lactulose and monitor mental status.
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
## 2022-04-11 NOTE — Assessment & Plan Note (Signed)
-  We will continue antihypertensives.

## 2022-04-11 NOTE — Assessment & Plan Note (Signed)
-  Physical therapy consult will be obtained. - This is likely secondary to his encephalopathy.

## 2022-04-11 NOTE — ED Notes (Signed)
Patient having trouble with swallowing pills and water but is able to get it down.  Paged Dr Julian Reil and unsure why he is getting PO when he has an SBO and getting oral lactulose.  Dr Julian Reil reaching out to [facility].

## 2022-04-11 NOTE — ED Notes (Addendum)
Verbal order from Dr Nehemiah Massed BY MOUTH AT THIS TIME. Until day time evaluates patient.

## 2022-04-11 NOTE — Consult Note (Signed)
Consultation Note Date: 04/11/2022   Patient Name: Dakota Bryant  DOB: 04-Mar-1971  MRN: [id number]  Age / Sex: 52 y.o., male  PCP: Dakota Standard, MD Referring Physician: Glade Lloyd, MD  Reason for Consultation: Establishing goals of care  HPI/Patient Profile: 52 y.o. 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 Patient was admitted on 04/11/2022 with SBO, hepatic encephalopathy, recurrent falls, elevated LFTs.  Clinical Assessment and Goals of Care: I have reviewed medical records including EPIC notes, labs, and imaging. Received report from primary RN - no acute concerns. RN reports patient has been lethargic today.   Oncology/Dr. Leonides Bryant was added to patient's care team - after receiving updates, feel comfort measures/hospice are reasonable.   Went to visit patient at bedside - no family/visitors present. Patient was lying in bed asleep - he does intermittently wake to voice/gentle touch. He is oriented x4 with very mumbled speech; however, does not seem to be able to make complex medical decisions. No signs or non-verbal gestures of pain or discomfort noted. No respiratory distress, increased work of breathing, or secretions noted. Patient is very ill and frail appearing.   Attempted to reviewed findings of SBO and history of cancer with patient. Expressed concern he is likely at end stages of his cancer - he seems surprised to hear this. Due to patient's lethargy, vaguely confirmed he has not had treatment since October 2023. Per chart review, he was receiving chemotherapy with concurrent radiation therapy for lung cancer. Plan was to start disease modifying treatment Lurbinectedin on 03/17/22 after  noted incurable marked recurrence disease in the liver; however, no patient follow up is noted in documentation.  Attempted to gain patient's thoughts/wishes on code status - he is not able to fully grasp question or make complex decision around this topic.  Asked who he would prefer I call to assist with decision making - mother/Dakota or sister/Dakota Bryant - patient tells me "Dakota Bryant."   1:26 PM Attempted to call mother/Dakota to discuss diagnosis, prognosis, GOC, EOL wishes, disposition, and options - no answer - confidential voicemail left and PMT phone number provided with request to return call.  1:29 PM Attempted to call sister/Dakota Bryant to discuss diagnosis, prognosis, GOC, EOL wishes, disposition, and options - no answer - confidential voicemail left and PMT phone number provided with request to return call.  3:17 PM Notified by primary RN patient's mother was at bedside and that she/patient were requesting DNR.  3:30 PM Went to patient's bedside on 6N - mother/Dakota present. Patient was awake and clear he wanted DNR status. Patient quickly fell back asleep and remained asleep for duration of visit with Dakota Bryant. Emotional support provided to Dakota Bryant as she is understandably tearful. She tells me "I understand what's going on." Reviewed medical updates since patient's admission to include SBO, she was aware of patient's diagnosis of lung cancer and mets to liver. Provided updates from oncology that they felt hospice/comfort measures would be reasonable considering patient's progressive decline and extreme frail state. She validates that patient has been declining over the last several months.   We talked about transition to comfort measures in house and what that would entail inclusive of medications to control pain, dyspnea, agitation, nausea, and itching. We discussed stopping all unnecessary measures such as blood draws, needle sticks, oxygen, antibiotics, CBGs/insulin,  cardiac monitoring, IVF, and frequent  vital signs. Education provided that other non-pharmacological interventions would be utilized for holistic support and comfort such as spiritual support if requested, repositioning, music therapy, offering comfort feeds, and/or therapeutic listening.  Dakota Bryant tells me she lost her husband in December of last year and "is having a hard time" with that loss. She understands home hospice services as she worked with them for her husband. Per her request, reviewed the difference between residential and home hospice services. She has been considering utilizing their grief counseling - encouraged their emotional support if she was able to participate. Offered chaplain services - she declines.   Dakota Bryant requests to discuss information given to her today with other family members prior to making final decisions, but does tell me that "comfort care is likely the best choice." She is open to meeting again tomorrow for final decisions - she will call PMT with a time to meet. Until tomorrow, goal is to continue gentle supportive treatment without escalation of care.  All questions and concerns addressed. Encouraged to call with questions and/or concerns. PMT card provided.   Primary Decision Maker: NEXT OF KIN - mother/Dakota Bryant    SUMMARY OF RECOMMENDATIONS   Continue current gentle supportive medical treatment without escalation of care Now DNR/DNI - durable DNR form completed and placed in shadow chart. Copy was made and will be scanned into Vyn[facility]/ACP tab Patient's mother is considering comfort/hospice transition - would like to discuss with other family prior to making final decisions Follow up meeting scheduled with patient's mother tomorrow 1/24 afternoon - she will call PMT with time PMT will continue to follow and support holistically   Code Status/Advance Care Planning: DNR  Palliative Prophylaxis:  Aspiration, Bowel Regimen, Frequent Pain Assessment, Oral Care, and Turn Reposition  Additional  Recommendations (Limitations, Scope, Preferences): Minimize Medications, No Artificial Feeding, No Surgical Procedures, and No Tracheostomy   Psycho-social/Spiritual:  Desire for further Chaplaincy support:no Created space and opportunity for patient and family to express thoughts and feelings regarding patient's current medical situation.  Emotional support and therapeutic listening provided.  Prognosis:  Poor in the setting of small cell lung cancer with liver mets and gradual decline over the last several months with multiple hospitalizations/ER visits  Discharge Planning: To Be Determined      Primary Diagnoses: Present on Admission:  Small bowel obstruction (HCC)  GERD without esophagitis  Hyponatremia   I have reviewed the medical record, interviewed the patient and family, and examined the patient. The following aspects are pertinent.  Past Medical History:  Diagnosis Date   Acid reflux    Arthritis    Colostomy in place [facility])    COVID 2021   moderate symptoms   Depression    in his thirties   Emphysema lung (HCC)    pt not aware of this   Headache    in his twenties   Hypertension    Mass of left lung    Perforated diverticulum    PONV (postoperative nausea and vomiting)    Small cell lung cancer (HCC)    Social History   Socioeconomic History   Marital status: Divorced    Spouse name: Not on file   Number of children: Not on file   Years of education: Not on file   Highest education level: Not on file  Occupational History   Not on file  Tobacco Use   Smoking status: Every Day    Packs/day: 1.00    Years:  39.00    Total pack years: 39.00    Types: Cigarettes    Start date: 1983    Passive exposure: Current   Smokeless tobacco: Never   Tobacco comments:    Currently smoking 1.5ppd as of 10/24/21 ep  Vaping Use   Vaping Use: Never used  Substance and Sexual Activity   Alcohol use: Not Currently    Alcohol/week: 2.0 Bryant drinks of alcohol     Types: 2 Cans of beer per week    Comment: "very rare" now , heavier in the past   Drug use: Yes    Frequency: 3.0 times per week    Types: Marijuana    Comment: last used 02/14/2022   Sexual activity: Yes  Other Topics Concern   Not on file  Social History Narrative   Not on file   Social Determinants of Health   Financial Resource Strain: High Risk (09/13/2021)   Overall Financial Resource Strain (CARDIA)    Difficulty of Paying Living Expenses: Hard  Food Insecurity: Food Insecurity Present (02/27/2022)   Hunger Vital Sign    Worried About Radiation protection practitioner of Food in the Last Year: Sometimes true    Ran Out of Food in the Last Year: Sometimes true  Transportation Needs: No Transportation Needs (02/27/2022)   PRAPARE - Administrator, Civil Service (Medical): No    Lack of Transportation (Non-Medical): No  Physical Activity: Not on file  Stress: Not on file  Social Connections: Not on file   History reviewed. No pertinent family history. Scheduled Meds:  amLODipine  10 mg Oral Daily   enoxaparin (LOVENOX) injection  40 mg Subcutaneous Q24H   labetalol  200 mg Oral BID   lactulose  300 mL Rectal Q8H   naphazoline-glycerin  1 drop Both Eyes BID   nicotine  14 mg Transdermal Daily   oxymetazoline  1 spray Each Nare BID   pantoprazole  40 mg Oral Daily   tamsulosin  0.4 mg Oral Daily   Continuous Infusions:  sodium chloride 100 mL/hr at 04/11/22 1033   PRN Meds:.ondansetron **OR** ondansetron (ZOFRAN) IV, traMADol Medications Prior to Admission:  Prior to Admission medications   Medication Sig Start Date End Date Taking? Authorizing Provider  tamsulosin (FLOMAX) 0.4 MG CAPS capsule Take 1 capsule (0.4 mg total) by mouth daily. 01/03/22  Yes Dakota Standard, MD  ALPRAZolam Prudy Feeler) 0.5 MG tablet Take 2 tablets (1 mg total) by mouth 3 (three) times daily as needed for anxiety. 03/01/22   Dakota Standard, MD  amLODipine (NORVASC) 10 MG tablet Take 1 tablet by  mouth once daily 02/16/22   Dakota Standard, MD  cetirizine (ZYRTEC) 10 MG tablet Take 1 tablet by mouth once daily 02/16/22   Dakota Standard, MD  esomeprazole (NEXIUM) 20 MG capsule Take 20 mg by mouth daily at 12 noon.    [provider]  HYDROcodone-acetaminophen (NORCO) 7.5-325 MG tablet Take 1 tablet by mouth every 6 (six) hours as needed for moderate pain. Patient not taking: Reported on 03/01/2022 01/25/22   Serena Colonel, MD  labetalol (NORMODYNE) 200 MG tablet Take 1 tablet by mouth twice daily 02/16/22   Dakota Standard, MD  Naphazoline-Glycerin-Zinc Sulf (CLEAR EYES MAXIMUM ITCHY EYE) 0.012-0.25-0.25 % SOLN Place 1 drop into both eyes 2 (two) times daily.    [provider]  ondansetron (ZOFRAN) 8 MG tablet Take 1 tablet (8 mg total) by mouth every 12 (twelve)  hours as needed for nausea or vomiting. Patient not taking: Reported on 03/01/2022 02/28/22   Dolores Frame, MD  oxymetazoline (AFRIN) 0.05 % nasal spray Place 1 spray into both nostrils 2 (two) times daily. Equate No drip    [provider]  senna-docusate (SENNA S) 8.6-50 MG tablet Take 1 tablet by mouth daily. May increase to 2 daily at bedtime as needed 03/01/22   Ulysees Barns IV, MD  sodium chloride 1 g tablet Take 1 tablet (1 g total) by mouth daily. 02/28/22   Vassie Loll, MD  traMADol (ULTRAM) 50 MG tablet Take 1-2 tablets (50-100 mg total) by mouth every 6 (six) hours as needed. 03/01/22   Dakota Standard, MD  traZODone (DESYREL) 50 MG tablet Take 0.5-1 tablets (25-50 mg total) by mouth at bedtime. 02/28/22   Vassie Loll, MD   No Known Allergies Review of Systems  Unable to perform ROS: Mental status change    Physical Exam Vitals and nursing note reviewed.  Constitutional:      General: He is not in acute distress.    Appearance: He is cachectic. He is ill-appearing.  Pulmonary:     Effort: No respiratory distress.  Skin:    General: Skin is warm and dry.   Neurological:     Mental Status: He is oriented to person, place, and time. He is lethargic.     Motor: Weakness present.  Psychiatric:        Speech: Speech is slurred.        Behavior: Behavior is cooperative.     Vital Signs: BP 91/70   Pulse 96   Temp 98.9 F (37.2 C) (Oral)   Resp 15   Ht 5\' 6"  (1.676 m)   Wt 54.4 kg   SpO2 96%   BMI 19.37 kg/m  Pain Scale: 0-10   Pain Score: Asleep   SpO2: SpO2: 96 % O2 Device:SpO2: 96 % O2 Flow Rate: .   IO: Intake/output summary:  Intake/Output Summary (Last 24 hours) at 04/11/2022 1304 Last data filed at 04/11/2022 1033 Gross per 24 hour  Intake 1108.3 ml  Output --  Net 1108.3 ml    LBM:   Baseline Weight: Weight: 54.4 kg Most recent weight: Weight: 54.4 kg     Palliative Assessment/Data: PPS 10%, currently NPO for SBO     Signed by: 04/02/2022, NP   Please contact Palliative Medicine Team phone at [phone] for questions and concerns.  For individual provider: See Amion  *Portions of this note are a verbal dictation therefore any spelling and/or grammatical errors are due to the "[employer] One" system interpretation.

## 2022-04-11 NOTE — ED Notes (Signed)
This paramedic attempted patients ordered enema. Patients anatomy was not normal and enema unable to be done at this time. MD made aware.

## 2022-04-11 NOTE — Assessment & Plan Note (Addendum)
-  This is likely secondary to volume depletion and dehydration as manifested by his azotemia. - We will follow BMP with hydration.
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
## 2022-04-11 NOTE — ED Notes (Signed)
New colostomy bag and dressing applied to the pt. Site around stoma was red, skin not in tact, and moderate drainage.

## 2022-04-11 NOTE — Assessment & Plan Note (Signed)
-  This is likely secondary to volume depletion and dehydration. - We will follow heart rate with hydration.

## 2022-04-11 NOTE — Assessment & Plan Note (Signed)
-  Pain management will be provided. - Palliative care consult to be obtained.

## 2022-04-11 NOTE — Progress Notes (Addendum)
Patient received from ED via stretcher.  Patient is drowsy, arousable.  Oriented to room and situation.  Assisted in position of comfort.  Patient communication difficult to understand due to dry mouth.

## 2022-04-11 NOTE — Progress Notes (Signed)
SLP Cancellation Note  Patient Details Name: Dakota Bryant MRN: [id number] DOB: February 03, 1971   Cancelled treatment:       Reason Eval/Treat Not Completed: Patient not medically ready. Chart reviewed and note that pt is NPO for SBO. Confirmed with MD that pt is to remain NPO for now. SLP to f/u as able for swallow eval.     Mahala Menghini., M.A. CCC-SLP Acute Rehabilitation Services Office [phone]  Secure chat preferred  04/11/2022, 8:08 AM

## 2022-04-11 NOTE — ED Notes (Signed)
Patient adjusted in the bed. Patient endorses being hungry and thirsty. Patients bed had multiple sheets and chucks pads. Patients bed was redressed and made more comfortable due to patients bed sore.

## 2022-04-12 ENCOUNTER — Inpatient Hospital Stay (HOSPITAL_COMMUNITY): Payer: 59

## 2022-04-12 DIAGNOSIS — E871 Hypo-osmolality and hyponatremia: Secondary | ICD-10-CM | POA: Diagnosis not present

## 2022-04-12 DIAGNOSIS — R64 Cachexia: Secondary | ICD-10-CM

## 2022-04-12 DIAGNOSIS — K56609 Unspecified intestinal obstruction, unspecified as to partial versus complete obstruction: Secondary | ICD-10-CM | POA: Diagnosis not present

## 2022-04-12 DIAGNOSIS — K7682 Hepatic encephalopathy: Secondary | ICD-10-CM | POA: Diagnosis not present

## 2022-04-12 DIAGNOSIS — R638 Other symptoms and signs concerning food and fluid intake: Secondary | ICD-10-CM

## 2022-04-12 DIAGNOSIS — N179 Acute kidney failure, unspecified: Secondary | ICD-10-CM | POA: Diagnosis not present

## 2022-04-12 LAB — GLUCOSE, CAPILLARY
Glucose-Capillary: 48 mg/dL — ABNORMAL LOW (ref 70–99)
Glucose-Capillary: 134 mg/dL — ABNORMAL HIGH (ref 70–99)

## 2022-04-12 LAB — CBC WITH DIFFERENTIAL/PLATELET
Abs Immature Granulocytes: 0 10*3/uL (ref 0.00–0.07)
Basophils Absolute: 0 10*3/uL (ref 0.0–0.1)
Basophils Relative: 0 %
Eosinophils Absolute: 0 10*3/uL (ref 0.0–0.5)
Eosinophils Relative: 0 %
HCT: 30 % — ABNORMAL LOW (ref 39.0–52.0)
Hemoglobin: 10.7 g/dL — ABNORMAL LOW (ref 13.0–17.0)
Lymphocytes Relative: 4 %
Lymphs Abs: 0.2 10*3/uL — ABNORMAL LOW (ref 0.7–4.0)
MCH: 33.8 pg (ref 26.0–34.0)
MCHC: 35.7 g/dL (ref 30.0–36.0)
MCV: 94.6 fL (ref 80.0–100.0)
Monocytes Absolute: 0.1 10*3/uL (ref 0.1–1.0)
Monocytes Relative: 3 %
Neutro Abs: 4.4 10*3/uL (ref 1.7–7.7)
Neutrophils Relative %: 93 %
Platelets: 76 10*3/uL — ABNORMAL LOW (ref 150–400)
RBC: 3.17 MIL/uL — ABNORMAL LOW (ref 4.22–5.81)
RDW: 19.2 % — ABNORMAL HIGH (ref 11.5–15.5)
WBC: 4.7 10*3/uL (ref 4.0–10.5)
nRBC: 0 % (ref 0.0–0.2)
nRBC: 0 /100 WBC

## 2022-04-12 LAB — MAGNESIUM: Magnesium: 2.3 mg/dL (ref 1.7–2.4)

## 2022-04-12 LAB — COMPREHENSIVE METABOLIC PANEL
ALT: 119 U/L — ABNORMAL HIGH (ref 0–44)
AST: 165 U/L — ABNORMAL HIGH (ref 15–41)
Albumin: 2.3 g/dL — ABNORMAL LOW (ref 3.5–5.0)
Alkaline Phosphatase: 401 U/L — ABNORMAL HIGH (ref 38–126)
Anion gap: 10 (ref 5–15)
BUN: 36 mg/dL — ABNORMAL HIGH (ref 6–20)
CO2: 26 mmol/L (ref 22–32)
Calcium: 8 mg/dL — ABNORMAL LOW (ref 8.9–10.3)
Chloride: 98 mmol/L (ref 98–111)
Creatinine, Ser: 0.63 mg/dL (ref 0.61–1.24)
GFR, Estimated: >60 mL/min (ref >60)
Glucose, Bld: 59 mg/dL — ABNORMAL LOW (ref 70–99)
Potassium: 3.6 mmol/L (ref 3.5–5.1)
Sodium: 134 mmol/L — ABNORMAL LOW (ref 135–145)
Total Bilirubin: 7.5 mg/dL — ABNORMAL HIGH (ref 0.3–1.2)
Total Protein: 4.9 g/dL — ABNORMAL LOW (ref 6.5–8.1)

## 2022-04-12 MED ORDER — GLYCOPYRROLATE 1 MG PO TABS: 1.0000 mg | ORAL_TABLET | ORAL | Status: DC | PRN

## 2022-04-12 MED ORDER — DEXTROSE-NACL 5-0.9 % IV SOLN: INTRAVENOUS | Status: DC

## 2022-04-12 MED ORDER — BIOTENE DRY MOUTH MT LIQD
15.0000 mL | Freq: Two times a day (BID) | OROMUCOSAL | Status: DC
  Administered 2022-04-12: 15 mL via TOPICAL

## 2022-04-12 MED ORDER — HALOPERIDOL 1 MG PO TABS: 2.0000 mg | ORAL_TABLET | Freq: Four times a day (QID) | ORAL | Status: DC | PRN

## 2022-04-12 MED ORDER — GLYCOPYRROLATE 0.2 MG/ML IJ SOLN: 0.2000 mg | INTRAMUSCULAR | Status: DC | PRN

## 2022-04-12 MED ORDER — HALOPERIDOL LACTATE 2 MG/ML PO CONC: 2.0000 mg | Freq: Four times a day (QID) | ORAL | Status: DC | PRN

## 2022-04-12 MED ORDER — DEXTROSE 50 % IV SOLN
25.0000 g | Freq: Once | INTRAVENOUS | Status: AC
  Administered 2022-04-12: 25 g via INTRAVENOUS

## 2022-04-12 MED ORDER — POLYVINYL ALCOHOL 1.4 % OP SOLN: 1.0000 [drp] | Freq: Four times a day (QID) | OPHTHALMIC | Status: DC | PRN

## 2022-04-12 MED ORDER — ACETAMINOPHEN 650 MG RE SUPP: 650.0000 mg | Freq: Four times a day (QID) | RECTAL | Status: DC | PRN

## 2022-04-12 MED ORDER — HALOPERIDOL LACTATE 5 MG/ML IJ SOLN: 2.0000 mg | Freq: Four times a day (QID) | INTRAMUSCULAR | Status: DC | PRN

## 2022-04-12 MED ORDER — MORPHINE SULFATE (PF) 2 MG/ML IV SOLN
2.0000 mg | INTRAVENOUS | Status: DC | PRN
  Administered 2022-04-12 – 2022-04-13 (×3): 2 mg via INTRAVENOUS
  Filled 2022-04-12 (×3): qty 1

## 2022-04-12 MED ORDER — DIPHENHYDRAMINE HCL 50 MG/ML IJ SOLN: 12.5000 mg | INTRAMUSCULAR | Status: DC | PRN

## 2022-04-12 MED ORDER — LORAZEPAM 2 MG/ML PO CONC
1.0000 mg | ORAL | Status: DC | PRN
  Administered 2022-04-13: 1 mg via SUBLINGUAL
  Filled 2022-04-12: qty 1

## 2022-04-12 MED ORDER — DEXTROSE 50 % IV SOLN
INTRAVENOUS | Status: AC
  Filled 2022-04-12: qty 50

## 2022-04-12 MED ORDER — ACETAMINOPHEN 325 MG PO TABS: 650.0000 mg | ORAL_TABLET | Freq: Four times a day (QID) | ORAL | Status: DC | PRN

## 2022-04-12 MED ORDER — LORAZEPAM 1 MG PO TABS: 1.0000 mg | ORAL_TABLET | ORAL | Status: DC | PRN
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
## 2022-04-12 NOTE — Social Work (Signed)
CSW was advised that family has opted for comfort care and are requesting inpatient hospice at [facility] with Evlyn Clines (formerly [facility]). CSW confirmed with facility they have received the referral. Facility stated they will review and follow up with CSW on acceptance and availability. TOC will continue to follow.
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
## 2022-04-12 NOTE — Progress Notes (Signed)
SLP Cancellation Note  Patient Details Name: Dakota Bryant MRN: [id number] DOB: [birth year]   Cancelled treatment:        Received order for swallow but pt has an SBO. MD discontinued order and will place when and if pt appropriate for po's.    Houston Siren 04/12/2022, 8:13 AM
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
## 2022-04-20 NOTE — Progress Notes (Signed)
    OVERNIGHT PROGRESS REPORT  Notified by RN that patient has expired at 34. 2 RN verified. Patient was comfort care.  Family has been notified by Conservation officer, historic buildings.    Raenette Rover, DNP, [place]
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
## 2022-04-20 DEATH — deceased

## 2022-04-28 ENCOUNTER — Ambulatory Visit: Payer: 59

## 2022-04-28 ENCOUNTER — Other Ambulatory Visit: Payer: 59

## 2022-04-28 ENCOUNTER — Ambulatory Visit: Payer: 59 | Admitting: Physician Assistant

## 2022-08-21 IMAGING — US IR IMAGING GUIDED PORT INSERTION
1 series · 1 of 1 positions shown · non-contrast
Comparison: none

CLINICAL DATA: Patient with left-sided small lung cancer requiring
a Port-A-Cath for chemotherapy
TECHNIQUE: The right neck and chest was prepped with chlorhexidine, and draped
in the usual sterile fashion using maximum barrier technique (cap
and mask, sterile gown, sterile gloves, large sterile sheet, hand
hygiene and cutaneous antiseptic). Local anesthesia was attained by
infiltration with 1% lidocaine with epinephrine.

[Series 1: ir fluoro/shunt/fist · 1 of 1 slices shown]
[im 1/1]
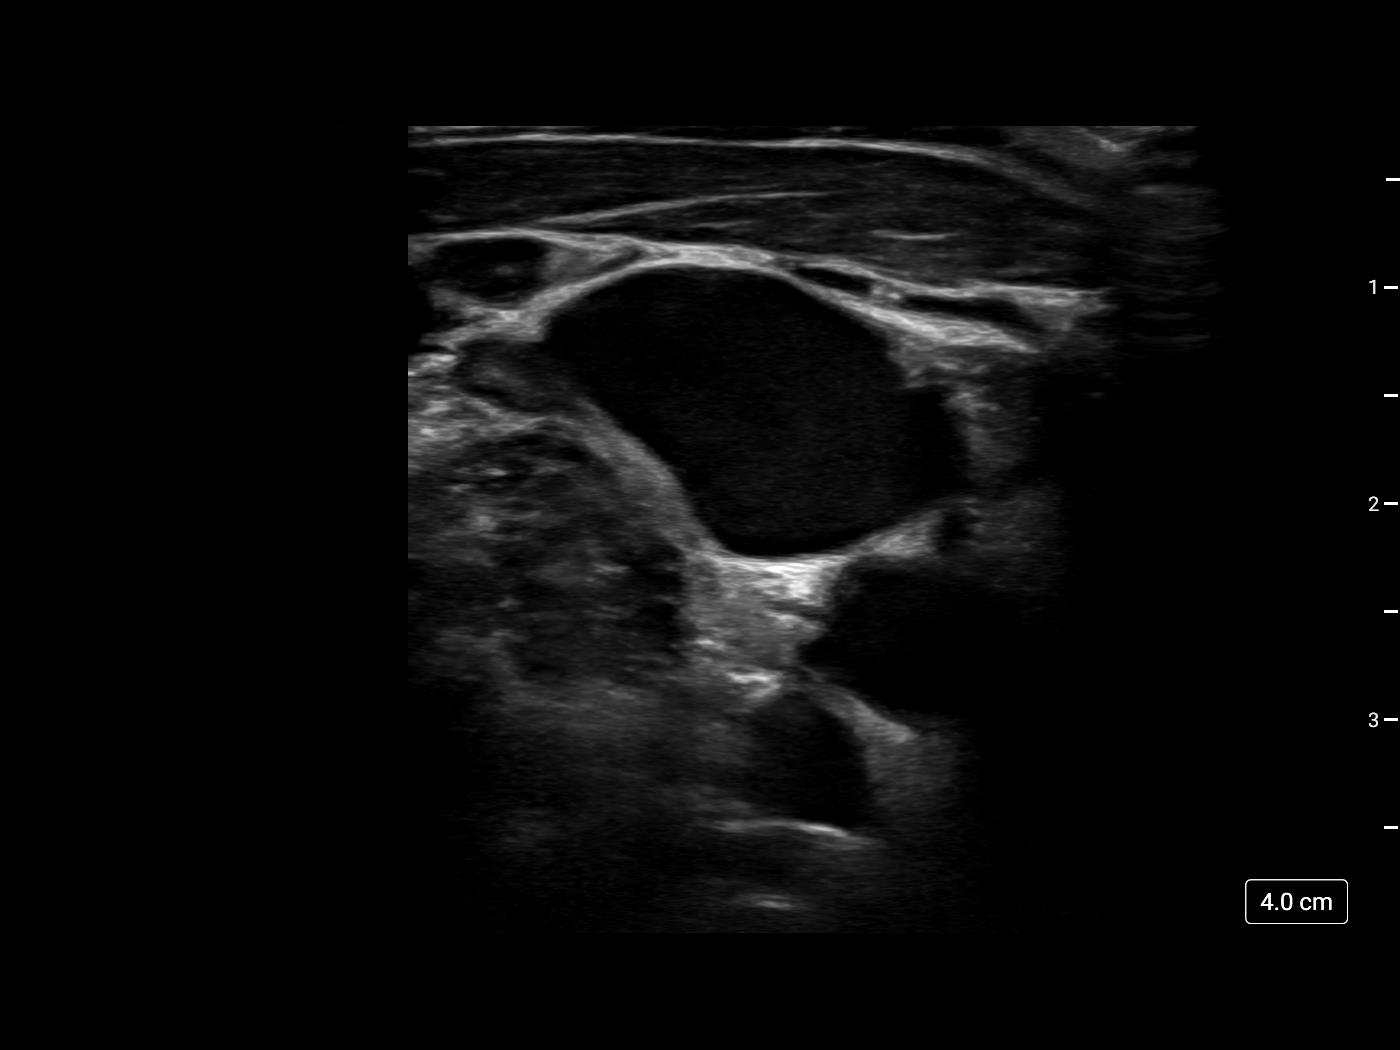

[1 of 1 positions shown; findings below may reference images not displayed]

EXAM:
IR IMAGING GUIDED PORT INSERTION

Date: 08/10/2021

ANESTHESIA/SEDATION:
Moderate (conscious) sedation was administered during this
procedure. A total of 4 mg Versed and 200 mg Fentanyl were
administered intravenously. The patient's vital signs were monitored
continuously by radiology nursing throughout the course of the
procedure.

Total sedation time: 28 minutes

FLUOROSCOPY:
6 seconds, 1 mGy
Ultrasound demonstrated patency of the right internal jugular vein,
and this was documented with an image. Under real-time ultrasound
guidance, this vein was accessed with a 21 gauge micropuncture
needle and image documentation was performed. A small dermatotomy
was made at the access site with an 11 scalpel. A 0.018" wire was
advanced into the SVC and the access needle exchanged for a 4F
micropuncture vascular sheath. The 0.018" wire was then removed and
a 0.035" wire advanced into the IVC.





The pocket was then closed in two layers using first subdermal
inverted interrupted absorbable sutures followed by a running
subcuticular suture. The epidermis was then sealed with Dermabond.
The dermatotomy at the venous access site was also closed with a
single inverted subdermal suture and the epidermis sealed with
Dermabond.

COMPLICATIONS:
None.  The patient tolerated the procedure well.
IMPRESSION: Successful placement of a right IJ approach Power Port with
ultrasound and fluoroscopic guidance. The catheter is ready for use.
